# Patient Record
Sex: Female | Born: 1990
Health system: Southern US, Community
[De-identification: ages and names within clinical notes are randomized; demographics above are authoritative.]

## PROBLEM LIST (undated history)

## (undated) ENCOUNTER — Inpatient Hospital Stay (HOSPITAL_COMMUNITY): Payer: Self-pay

## (undated) DIAGNOSIS — E282 Polycystic ovarian syndrome: Secondary | ICD-10-CM

## (undated) DIAGNOSIS — R011 Cardiac murmur, unspecified: Secondary | ICD-10-CM

## (undated) DIAGNOSIS — Q249 Congenital malformation of heart, unspecified: Secondary | ICD-10-CM

## (undated) DIAGNOSIS — O24419 Gestational diabetes mellitus in pregnancy, unspecified control: Secondary | ICD-10-CM

## (undated) HISTORY — PX: CARDIAC SURGERY: SHX584

## (undated) HISTORY — DX: Polycystic ovarian syndrome: E28.2

## (undated) HISTORY — DX: Gestational diabetes mellitus in pregnancy, unspecified control: O24.419

## (undated) HISTORY — DX: Congenital malformation of heart, unspecified: Q24.9

## (undated) HISTORY — DX: Cardiac murmur, unspecified: R01.1

## (undated) HISTORY — PX: COARCTATION OF AORTA REPAIR: SHX261

---
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
## 2009-02-08 ENCOUNTER — Encounter (INDEPENDENT_AMBULATORY_CARE_PROVIDER_SITE_OTHER): Payer: Self-pay | Admitting: Internal Medicine

## 2009-02-08 ENCOUNTER — Ambulatory Visit: Payer: Self-pay | Admitting: Internal Medicine

## 2009-02-08 LAB — CONVERTED CEMR LAB
BUN: 11 mg/dL (ref 6–23)
Basophils Absolute: 0 10*3/uL (ref 0.0–0.1)
Basophils Relative: 0 % (ref 0–1)
Calcium: 10 mg/dL (ref 8.4–10.5)
Chloride: 105 meq/L (ref 96–112)
Creatinine, Ser: 0.58 mg/dL (ref 0.40–1.20)
Eosinophils Absolute: 0.2 10*3/uL (ref 0.0–0.7)
Eosinophils Relative: 3 % (ref 0–5)
FSH: 2.8 milliintl units/mL
HCT: 42 % (ref 36.0–46.0)
Lymphs Abs: 2.6 10*3/uL (ref 0.7–4.0)
MCHC: 32.1 g/dL (ref 30.0–36.0)
MCV: 86.4 fL (ref 78.0–100.0)
Neutrophils Relative %: 51 % (ref 43–77)
Platelets: 254 10*3/uL (ref 150–400)
Prolactin: 7.3 ng/mL
RDW: 13.3 % (ref 11.5–15.5)
WBC: 6.8 10*3/uL (ref 4.0–10.5)

## 2009-02-09 ENCOUNTER — Encounter (INDEPENDENT_AMBULATORY_CARE_PROVIDER_SITE_OTHER): Payer: Self-pay | Admitting: Internal Medicine

## 2009-02-13 ENCOUNTER — Ambulatory Visit (HOSPITAL_COMMUNITY): Admission: RE | Admit: 2009-02-13 | Discharge: 2009-02-13 | Payer: Self-pay | Admitting: Internal Medicine

## 2011-12-16 ENCOUNTER — Encounter (HOSPITAL_COMMUNITY): Payer: Self-pay | Admitting: Anesthesiology

## 2011-12-16 ENCOUNTER — Inpatient Hospital Stay: Admit: 2011-12-16 | Payer: Self-pay | Admitting: General Surgery

## 2011-12-16 SURGERY — APPENDECTOMY, LAPAROSCOPIC
Anesthesia: General | Laterality: Bilateral

## 2013-03-24 ENCOUNTER — Encounter: Payer: Self-pay | Admitting: Obstetrics and Gynecology

## 2013-03-24 ENCOUNTER — Ambulatory Visit (INDEPENDENT_AMBULATORY_CARE_PROVIDER_SITE_OTHER): Payer: Self-pay | Admitting: Obstetrics and Gynecology

## 2013-03-24 VITALS — BP 110/77 | HR 77 | Temp 98.0°F | Ht 62.5 in | Wt 208.6 lb

## 2013-03-24 DIAGNOSIS — E282 Polycystic ovarian syndrome: Secondary | ICD-10-CM

## 2013-03-24 DIAGNOSIS — Z01419 Encounter for gynecological examination (general) (routine) without abnormal findings: Secondary | ICD-10-CM

## 2013-03-24 DIAGNOSIS — N913 Primary oligomenorrhea: Secondary | ICD-10-CM

## 2013-03-24 DIAGNOSIS — N915 Oligomenorrhea, unspecified: Secondary | ICD-10-CM

## 2013-03-24 HISTORY — DX: Polycystic ovarian syndrome: E28.2

## 2013-03-24 NOTE — Progress Notes (Signed)
  Subjective:     Kimberly Reid is a 22 y.o. female G0P0 with LMP 01/26/2013 who is here for a comprehensive physical exam. The patient reports irregular menses. Patient reports onset of menarche at 28. She reports being irregular since that time, often skipping 3-4 months. Patient was seen 3 years ago in [place] and was told she has numerous cysts on her ovaries. Patient was also advised at that time to start Metformin and birth control pills. She states that it helped her become regular. She is now trying to conceive. Patient is otherwise without any other complaints and denies facial hair or hair in female distribution pattern.  History   Social History  . Marital Status: Single    Spouse Name: N/A    Number of Children: N/A  . Years of Education: N/A   Occupational History  . Not on file.   Social History Main Topics  . Smoking status: Never Smoker   . Smokeless tobacco: Never Used  . Alcohol Use: Yes  . Drug Use: No  . Sexually Active: Yes    Birth Control/ Protection: None   Other Topics Concern  . Not on file   Social History Narrative  . No narrative on file   Health Maintenance  Topic Date Due  . Pap Smear  03/07/2008  . Tetanus/tdap  03/07/2009  . Influenza Vaccine  06/28/2013   Past Medical History  Diagnosis Date  . 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 Cardiac surgery     History  Substance Use Topics  . Smoking status: Never Smoker   . Smokeless tobacco: Never Used  . Alcohol Use: Yes   Family History  Problem Relation Age of Onset  . Diabetes Mother        Review of Systems A comprehensive review of systems was negative.   Objective:      GENERAL: Well-developed, well-nourished female in no acute distress.  HEENT: Normocephalic, atraumatic. Sclerae anicteric.  NECK: Supple. Normal thyroid.  LUNGS: Clear to auscultation bilaterally.  HEART: Regular rate and  rhythm. BREASTS: Symmetric in size. No palpable masses or lymphadenopathy, skin changes, or nipple drainage. ABDOMEN: Soft, nontender, nondistended. Obese. PELVIC: Normal external female genitalia. Vagina is pink and rugated.  Normal discharge. Normal appearing cervix. Uterus is normal in size. No adnexal mass or tenderness. EXTREMITIES: No cyanosis, clubbing, or edema, 2+ distal pulses.    Assessment:    Healthy female exam.      Plan:    Pap smear performed Suspect patient has PCOS Will order labs and pelvic ultrasound Discussed impact of PCOS on fertility. Advised to continue Metformin but to increase dose to 1000 mg BID. Also discussed the use of over the counter ovulation kits. Patient will be contacted with any abnormal results. RTC prn See After Visit Summary for Counseling Recommendations

## 2013-03-24 NOTE — Patient Instructions (Signed)
Cuidados preventivos en las mujeres adultas  (Preventive Care for Adults, Female) Un estilo de vida saludable y los cuidados preventivos pueden favorecer la salud y [place]. Las guas para Engineer, building services salud para las mujeres incluyen las siguientes prcticas clave:   Un examen fsico de rutina anual es un buen modo de Chief Operating Officer su salud y Education officer, environmental estudios preventivos. Le da la posibilidad de Agricultural consultant preocupaciones y Solicitor el estado de su salud, y que le realicen estudios completos.  Consulte al dentista para realizar un examen de rutina y cuidados preventivos cada 6 meses. Cepllese los dientes al [employer] veces por da y psese el hilo dental al menos una vez por da. Una buena higiene bucal evita caries y enfermedades de las encas.  La frecuencia con que debe hacerse exmenes de la vista depende de la edad, el estado de [place], la historia familiar, el uso de lentes de contacto y otros factores. Siga las recomendaciones del mdico para saber con qu frecuencia debe hacerse exmenes de la vista.  Consuma una dieta saludable. Los Sun Microsystems, frutas, granos enteros, productos lcteos descremados y protenas magras contienen los nutrientes que usted necesita sin necesidad de consumir muchas caloras. Disminuya el consume de alimentos con alto contenido de grasas slidas, azcar y sal agregadas. Consuma la cantidad de caloras adecuada para usted.Si es necesario, pdale una dieta adecuada al profesional que lo asiste.  La actividad fsica regular es una de las cosas ms importantes que puede hacer por su salud. Los adultos deben hacer al menos 150 minutos de ejercicios de [place] de intensidad moderada (toda actividad que aumente la frecuencia cardaca y lo haga transpirar) cada semana. Adems, la [employer]son de los adultos necesita ejercicios de fortalecimiento muscular 2  ms [employer].  Hay que mantener un peso saludable. El ndice de masa corporal [facility]) es una  herramienta que identifica posibles problemas con [place]. Proporciona una estimacin de la grasa corporal basndose en el peso y la altura. El mdico podr determinar si [facility] y podr ayudarlo a Personnel officer o Pharmacologist un peso saludable.Para los adultos de 20 aos o ms:  Un [facility] menor a 18,5 se considera bajo peso.  Un [facility] entre 18,5 y 24,9 es normal.  Un [facility] entre 25 y 29,9 es sobrepeso.  Un IMC entre 30 o ms es obesidad.  Mantenga un nivel normal de lpidos y colesterol en sangre practicando actividad fsica y minimizando la ingesta de grasas saturadas. Consuma una dieta balanceada y saludable, e incluya variedad de frutas y vegetales. Los [employer] de lpidos y Oncologist en sangre deben Games developer a los 20 aos y repetirse cada 5 aos. Si los niveles de colesterol son altos, tiene ms de 50 aos o tiene riesgo elevado de sufrir enfermedades cardacas necesitar controlarse con ms frecuencia.Si tiene [employer] de lpidos y colesterol, debe recibir tratamiento con medicamentos, si la dieta y el ejercicio no son efectivos.  Si fuma, consulte con [place] [employer] de las opciones para dejar de [place]. Si no lo hace, no comience.  Si est embarazada no beba alcohol. Si est amamantando, beba alcohol con prudencia. Si elige beber alcohol, no se exceda de 1 medida por da. Se considera una medida a 12 onzas (355 ml) de cerveza, 5 onzas (148 ml) de vino, o 1,5 onzas (44 ml) de licor.  Evite el alcohol y el consumo de drogas. No comparta agujas. Pida ayuda si necesita asistencia o instrucciones con respecto a abandonar el consumo de alcohol, cigarrillos o  drogas.  La hipertensin arterial causa enfermedades cardacas y [place] el riesgo de ictus. Debe controlar su presin arterial al menos cada 1 o 2 aos. La presin arterial elevada que persiste debe tratarse con medicamentos si la prdida de peso y el ejercicio no son efectivos.  Si tiene entre 55 y 49 aos, consulte a su mdico si debe tomar  aspirina para prevenir enfermedades cardacas.  Los anlisis para la diabetes incluyen la toma de [place] de sangre para controlar el nivel de azcar en la sangre durante el [place]. Debe hacerlo cada 3 aos despus de los 45 aos si est dentro de su peso normal y sin factores de riesgo para la diabetes. Las pruebas deben comenzar a edades tempranas o llevarse a cabo con ms frecuencia si tiene sobrepeso y al menos 1 factor de riesgo para la diabetes.  Las evaluaciones para Market researcher de mama son un mtodo preventivo fundamental para las mujeres. Debe practicar la "autoconciencia de las mamas". Esto significa que debe comprender como es la apariencia normal y como se sienten sus mamas e incluir un autoexamen. Si detecta algn cambio, no importa cun pequeo sea, debe informarlo a su mdico. Las mujeres entre 20 y 30 aos deben hacer un examen clnico de las mamas como parte del examen regular de [place], cada 1 a 3 aos. Despus de los 40 aos deben Sprint [employer]el Corporation. Deben hacerse una mamografa cada ao, comenzando a los 40 aos. Las mujeres con Brewing technologist de cncer de mama deben hablar con el mdico para hacer un estudio gentico. Las que tienen ms riesgo deben hacerse una ecografa y [place] mamografa todos [place].  Un papanicolau se realiza para diagnosticar cncer de cuello de tero. Muestra los cambios celulares en el cuello que pueden transformarse en cncer si no se tratan. El papanicolau es un procedimiento por el que se obtienen clulas de la parte inferior del tero (cuello) y son examinadas.  Las mujeres deben [employer] un papanicolau a Glass blower/designer de los [address].  [place] 21 y los 29 aos debe repetirse [address].  Luego de los 30 aos, debe realizarse un Papanicolaou cada tres aos siempre que los 3 estudios anteriores sean normales.  Algunas mujeres sufren problemas mdicos que aumenta la probabilidad de Research officer, political party cervical. Consulte a su mdico acerca de estos  problemas. Es muy importante que le informe a su mdico si aparecen nuevos problemas poco despus de su ltimo Papanicolaou. En estos casos, el mdico podr indicar que se realice el Papanicolaou con ms frecuencia.  Estas recomendaciones son las mismas para todas las mujeres hayan recibido o no la vacuna para el VPH (virus del papiloma humano).  Si le han realizado una histerectoma por un problema que no era cncer u otra enfermedad que podra causar cncer, ya no necesitar un Papanicolaou. Sin embargo, si ya no necesita hacerse un Papanicolau, es una buena idea hacerse un examen regularmente para asegurarse de que no hay otros problemas.  Si tiene entre 65 y 46 aos y ha tenido un Papanicolaou normal en los ltimos 10 aos, ya no ser Music therapist. Sin embargo, si ya no necesita hacerse un Papanicolau, es una buena idea hacerse un examen regularmente para asegurarse de que no hay otros problemas.  Si ha recibido un tratamiento para Management consultant cervical o para una enfermedad que podra causar cncer, Musician un Papanicolaou y Paediatric nurse durante al menos 20 aos de concluir el [place].  Si no se ha  hecho el examen con regularidad, debern volver a evaluarse los factores de riesgo (como el tener un nuevo compaero sexual) para Occupational psychologist a Printmaker.  La prueba del VPH es un anlisis adicional que puede usarse para Engineer, site de cuello de tero. Esta prueba busca la presencia del virus que causa los cambios en el cuello. Las [employer] se recolectan durante el papanicolau pueden usarse para el VPH. La prueba para el VPH puede usarse para Development worker, community a mujeres de ms de 30 aos y debe usarse en mujeres de cualquier edad Cisco del papanicolau no sean claros. Despus de los 30 aos, las mujeres deben hacerse el anlisis para el VPH con la misma frecuencia que el papanicolau.  El cncer colorectal puede detectarse y con fecuencia puede  prevenirse. La mayor parte de los estudios de rutina comienzan a los 50 aos y [place] 75 aos. Sin embargo, el mdico podr aconsejarle que lo haga antes, si tiene factores de riesgo para el cncer de colon. Una vez por ao, el profesional le dar un kit de prueba para Recruitment consultant oculta en la materia fecal. Ladonna Snide un tubo con Ladon Applebaum cmara en su extremo para examinar directamente el colon (sigmoidoscopa o colonoscopa), para detectar formas temprana de cncer colorectal. Hable con su mdico si tiene 50 aos, cuando comience con los estudios de [place]. El examen directo del colon debe repetirse cada 5 a 10 aos, hasta los 75 aos, excepto que se encuentren formas tempranas de plipos precancerosos o pequeos bultos.  Se recomienda realizar un anlisis de sangre para Engineer, manufacturing hepatitis C a todas las personas [address] 1946 y 1966, y a todo aquel que tenga un riesgo conocido de haber contrado esta enfermedad.  Practique el sexo seguro. Use condones y evite las prcticas sexuales riesgosas para disminuir el contagio de enfermedades de transmisin sexual. Las enfermedades de transmisin sexual son la gonorrea, clamidia, sfilis, tricomonas, herpes, VPH y el virus de inmunodeficiencia humana (VIH). El herpes, el VIH y [place] VPH son enfermedades virales que no tienen [place]. Pueden producir discapacidad, cncer y [employer]. Las mujeres sexualmente activas de 25 aos o menos deben ser evaluadas para Engineer, manufacturing clamidia. Las Coca Cola que tengan mltiples compaeros tambin deben hacerse el anlisis para Engineer, manufacturing clamidia. Se recomienda realizar anlisis para detectar otras enfermedades de transmisin sexual si es sexualmente [place] y tiene riesgos.  La osteoporosis es una enfermedad en la que los huesos pierden los minerales y la fuerza por el avance de la edad. El resultado pueden ser fracturas graves en los [place]. El riesgo de osteoporosis puede identificarse con Neomia Dear prueba de  densidad sea. Las mujeres de ms de 65 aos y las que tengan riesgos de sufrir fracturas u osteoporosis deben pedir consejo a su mdico. Consulte a su mdico si debe tomar un suplemento de calcio o de vitamina D para reducir el riesgo de osteoporosis.  La menopausia se asocia a sntomas y riesgos fsicos. Se dispone de una terapia de reemplazo hormonal para disminuir los sntomas y [place]. Consulte a su mdico para saber si la terapia de reemplazo hormonal es conveniente para usted.  Use una pantalla solar con un factor SPF de 30 o mayor. Aplique pantalla de Pietro Cassis y repetida a lo largo del Futures trader. Pngase al resguardo del sol cuando la sombra sea ms pequea que usted. Protjase usando mangas y [place], un sombrero de ala ancha y gafas para el sol todo [employer],  siempre que se encuentre en el exterior.  Una vez por mes hgase un examen de la piel de todo el cuerpo usando un espejo para ver la espalda. nforme al mdico si aparecen nuevos lunares, los que ya estn tienen bordes [address], los que sean ms grandes que una goma de lpiz o los que hayan cambiado su forma o color.  Mantngase al da con las vacunas.  Gripe: Debe aplicarse una dosis todos en cada otoo (o invierno). La composicin de la vacuna de la gripe cambia todos los [place], por lo tanto no es suficiente con vacunarse una vez.  Vacuna antineumocccica de polisacridos Debe aplicarse 1  2 dosis si fuma o si sufre ciertas enfermedades crnicas. Necesitar 1 dosis a los 65 aos (o ms) si nunca se ha vacunado.  Vacuna difteria, ttanos, tos convulsa (DTP). Aplquese una dosis de la vacuna DTaP (vacuna contra la tos convulsa para adultos) si es menor de 65 aos, si tiene ms de 65 aos y est en contacto con un beb, es un trabajador de la [place], es una mujer embarazada o simplemente quiere estar protegido de la enfermedad. Luego necesitar un refuerzo de DT cada 10 aos. Consulte con su mdico si no ha recibido al menos  3 dosis de la vacuna contra el ttanos (y la difteria) en algn momento de su vida o tiene una herida profunda o sucia.  VPH: Debe aplicarse esta vacuna si tiene 26 aos o menos. La vacuna se administra en 3 dosis, generalmente durante el curso de 6 meses.  MMR (sarampin, paperas, rubola) Debe aplicarse al menos una dosis de MMR si ha nacido en 1957 o despus. Podra tambin necesitar una segunda dosis.  Antimeningocccica Si tiene entre 19 y 37 aos y es un estudiante universitario de Therapist, occupational que vive en una residencia estudiantil, o tiene alguna enfermedad mdica, debe recibir esta vacuna. Podra tambin necesitar dosis de refuerzo.  Herpes zoster (culebrilla). Si tiene 60 aos o ms debe aplicarse esta vacuna ahora.  Varicela Si nunca se vacun o slo recibi una dosis, hable con su mdico para averiguar si necesita aplicarse esta vacuna.  Hepatitis A. Debe aplicarse esta vacuna si tiene un factor de riesgo especfico para contraer una infeccin por el virus de la hepatitis A, o simplemente desea estar protegido contra la enfermedad. La vacuna se administra en 2 dosis, con una diferencia entre 6 y 18 meses.  Hepatitis B. Debe aplicarse esta vacuna si tiene un factor de riesgo especfico para contraer una infeccin por el virus de la hepatitis B, o simplemente desea estar protegido contra la enfermedad. La vacuna se administra en 3 dosis, generalmente durante el curso de 6 meses. Controles preventivos - Frecuencia Edad 19 a 39  Control de la presin arterial.** / Cada 1 a 2 aos.  Control de lpidos y colesterol.** / Cada 5 aos, comenzando a los 20 aos.  Examen clnico de mamas.** / Cada 3 aos en las [employer] 20 y los [address] [place].  Papanicolau.** / Cada 2 aos [employer] 21 y los [place]. Despus de los [address] [place], y [place] 65 o 70, con una historia de 3 papanicolau normales consecutivos.  Pruebas para el VPH.** / Cada 3 aos, a partir de los [address] [place], y [place] 65 o 70, con  una historia de 3 papanicolau normales consecutivos.  Anlisis de sangre para la hepatitis C. ** / Para todo individuo con riesgos conocidos para la hepatitis C.  Autoexamen de piel. /  [employer].  Vacuna contra la gripe.** / [employer].  Vacuna antineumocccica de polisacridos.** / Debe aplicarse 1  2 dosis si fuma o si sufre ciertas enfermedades crnicas.  Vacuna difteria, ttanos, tos convulsa (Tdap, Td). / Una dosis nica de vacuna Tdap. Luego necesitar un refuerzo de DT cada 10 aos.  Vacuna Printmaker. / 3 dosis en el curso de 6 meses, si tiene 26 aos o menos.  MMR (sarampin, paperas, rubola). / Debe aplicarse al menos una dosis de MMR si ha nacido en 1957 o despus. Podra tambin necesitar una segunda dosis.  Vacunacin antimeningocccica. / Si tiene entre 35 y 94 aos y es un estudiante universitario de Therapist, occupational que vive en una residencia estudiantil, o tiene alguna enfermedad mdica, debe recibir esta vacuna. Podra tambin necesitar dosis de refuerzo.  Vacuna contra la varicela.** / Consltelo con el mdico.  Vacuna contra la hepatitis A.** / Consltelo con el mdico. 2 dosis, con un intervalo entre 6 a 18 meses.  Vacuna contra la hepatitis B.** / Consltelo con el mdico. 3 dosis en el curso de 6 meses. **La historia familiar y personal de riesgos y enfermedades puede cambiar las recomendaciones del mdico. Document Released: 07/24/2005 Document Revised: 01/06/2012 ExitCare Patient Information 2014 [place], [place].  Sndrome poliqustico de los ovarios (Polycystic Ovarian Syndrome) El sndrome poliqustico de los ovarios es una enfermedad con una gran cantidad de problemas. Uno de ellos es con los ovarios. Los ovarios son rganos ubicados en la pelvis de la mujer, a cada lado del tero. Normalmente, durante el ciclo menstrual, se libera un vulo de un ovario cada mes. Esto se denomina ovulacin. Cuando un vulo es fertilizado, se dirige al tero, donde  comienza a desarrollarse el beb. El vulo debe ir a travs de la trompa de Falopio hacia la matriz (tero). Los ovarios tambin producen las hormonas estrgeno y Education officer, museum. Estas hormonas Primary school teacher de los pechos de la mujer, la forma de su cuerpo y Herbalist. Tambin regulan el ciclo menstrual y Firefighter. A veces, se forman quistes en los ovarios. Un quiste es una cavidad llena de lquido. En el ovario, pueden formarse diferentes tipos de quistes. El tipo ms frecuente de quiste de ovario se denomina quiste de la ovulacin o funcional. Es normal y generalmente se forma durante el ciclo menstrual habitual. A menudo se forma en un ciclo menstrual normal. Cada mes, en los ovarios de la mujer crecen quistes pequeos que contienen los vulos. Cuando el vulo crece por completo, se abre el saco que lo contiene. Este libera al vulo. El saco luego se disuelve. En un tipo de quiste funcional, denominado quiste folicular, el saco no se rompe para liberar el vulo. Podra continuar creciendo. Esto tipos de quistes generalmente desaparecen entre 1 y 3 meses.  Un tipo de problema de quistes en los ovarios se denomina Sndrome Poliqustico de los Ovarios (SPQO). En esta enfermedad, se forman mucho quistes en el folculo, pero no se rompe ni produce un vulo. Este problema de salud afecta a lo siguiente:  El ciclo menstrual  El corazn.  Obesidad.  Cncer en el tero.  La fertilidad.  Vasos sanguneos.  Crecimiento del vello (en el rostro y el cuerpo) o calvicie.  Hormonas.  Apariencia.  Hipertensin arterial.  Ictus.  Produccin de insulina.  Inflamacin del hgado.  Colesterol y triglicridos elevados en sangre. CAUSAS  No se conoce la causa exacta de esta enfermedad.  Las mujeres con [place]  a menudo tienen la madre o una hermana con [place]. Esto no es prueba suficiente para decir que es hereditario.  Muchas mujeres con SPQO tienen problemas de [place].  Los  investigadores estn estudiando la relacin entre el SPQO y la capacidad del cuerpo para producir insulina. La insulina es una hormona que regula los cambios de International aid/development worker, almidn y otro alimentos a energa para su utilizacin o almacenamiento en el cuerpo. Algunas mujeres con SPQO producen demasiada insulina. Es posible que los ovarios reaccionen al producir muchas hormonas masculinas, denominadas andrgenos. Esto puede llevar al acn, crecimiento excesivo del vello, aumento de peso y problemas de ovulacin.  Demasiada produccin de la hormona luteinizante (LH) de la glndula pituitaria en el cerebro estimula al ovario para producir demasiado hormona masculina (andrgenos). SNTOMAS  Poco frecuente o ningn perodo menstrual o hemorragias irregulares.  Incapacidad para quedar embarazada (infertilidad) debido a la falta de ovulacin.  Aumento del crecimiento del vello en el rostro, el trax, el [address], la espalda, los muslos o los dedos de los pies.  Acn, piel grasa o caspa.  Dolor en la pelvis.  Ganancia de peso u obesidad, por lo general se acumula grasa alrededor de Lobbyist.  Diabetes tipo 2 (generalmente no requiere insulina).  Colesterol elevado  Presin arterial elevada.  Patrn masculino de calvicie o cabello fino.  Manchas marrn oscuro o negras en la piel del cuello, brazos, pechos o muslos.  Marcas, o pequeos excesos de piel en la zona del cuello o las [place].  Apnea del sueo Esto es ronquidos excesivos e interrupciones de la respiracin cuando se est dormido.  La voz se hace ms grave.  Diabetes gestacional durante el embarazo.  Aumenta el riesgo de aborto espontneo. DIAGNSTICO No hay ninguna prueba que pueda diagnosticar esta enfermedad.   El profesional:  Observar el historial clnico.  Education officer, environmental un examen plvico  Indicar una ecografa  Controlar sus niveles de hormonas femeninas y [place].  Medir los niveles de azcar o glucosa en la  sangre.  Realizar otros anlisis de [place].  Si est produciendo muchas hormonas masculinas, el mdico se asegurar que se deba al SPQO. En el examen fsico, el mdico querr [employer] zonas de aumento de vello. Trate de Social worker natural del vello algunos das antes de la visita.  Durante el examen plvico, los ovarios pueden estar agrandados o inflamados debido al gran nmero de pequeos quistes. Esto podr verse ms fcilmente mediante un ultrasonido o examen vaginal para ver si hay quistes en los ovarios y las paredes del tero (endometrio). La pared uterina puede volverse ms gruesa si no ha habido un perodo regular. TRATAMIENTO Debido a que no hay cura para el SPQO, es necesario controlarlo para Physiological scientist. Los tratamientos se basan en los sntomas. Tambin se basa en su deseo de tener un beb o usar anticonceptivos.  Los tratamientos pueden ser:  Hormona progesterona, para iniciar un periodo menstrual.  Pldoras anticonceptivas, para Orthoptist.  Medicamentos para estimular la ovulacin, si quiere quedar embarazada.  Medicamentos para Air traffic controller.  Medicamentos para Scientist, physiological presin arterial.  Medicamentos y dietas para Chief Operating Officer los altos niveles de colesterol y triglicridos en la sangre.  La ciruga, realizando pequeos cortes en el ovario, para disminuir la produccin de hormona masculina. Esto se realiza a travs de un tubo largo luminoso (laparoscopio) y se coloc en la pelvis a travs de una pequea incisin en la zona inferior del abdomen. El profesional que la Hotel manager  con usted las opciones a Public relations account executive. LAS MUJERES CON SPQO TIENEN ESTAS CARACTERSTICAS:  Altos niveles de hormonas masculinas denominada andrgenos.  Perodos menstruales irregulares o ausentes.  Pueden tener pequeos [employer]O. El SPQO es el problema hormonal reproductivo ms comn en las mujeres de edad frtil. PORQUE LAS  MUJERES CON SNDROME POLI QUSTICO TIENEN PROBLEMAS CON SU CICLO MENSTRUAL Todos los meses, comienzan a Customer service manager en los ovarios alrededor de 20 vulos. Cuando un vulo crece y [place], el folculo se abre para liberarlo para que pueda viajar a travs de las trompas de Falopio para Tree surgeon. Cuando un vulo sale del folculo, comienza la ovulacin. En las mujeres con PQO, el ovario no produce todas las hormonas necesarias para que cualquiera de los vulos [place].Pueden comenzar a desarrollarse y Counselling psychologist lquido, pero ninguno se desarrolla lo suficiente. En vez de eso, algunos permanecen como quistes. Como no madura ni se libera ningn vulo, no ocurre la ovulacin y se produce la hormona progesterona. Sin progesterona, el ciclo menstrual es irregular o ausente. Adems, el quiste produce hormonas masculinas, que continan evitando la ovulacin.  Document Released: 01/30/2009 Document Revised: 01/06/2012 [facility] Patient Information 2014 [place], [place].

## 2013-03-25 LAB — TSH: TSH: 3.697 u[IU]/mL (ref 0.350–4.500)

## 2013-04-01 ENCOUNTER — Ambulatory Visit (HOSPITAL_COMMUNITY)
Admission: RE | Admit: 2013-04-01 | Discharge: 2013-04-01 | Disposition: A | Discharge status: Home / Self Care | Payer: No Typology Code available for payment source | Source: Ambulatory Visit | Attending: Obstetrics and Gynecology | Admitting: Obstetrics and Gynecology

## 2013-04-01 DIAGNOSIS — N926 Irregular menstruation, unspecified: Secondary | ICD-10-CM | POA: Insufficient documentation

## 2013-04-01 DIAGNOSIS — N913 Primary oligomenorrhea: Secondary | ICD-10-CM

## 2013-04-01 DIAGNOSIS — N915 Oligomenorrhea, unspecified: Secondary | ICD-10-CM | POA: Insufficient documentation

## 2013-04-26 ENCOUNTER — Ambulatory Visit: Payer: No Typology Code available for payment source

## 2013-05-26 ENCOUNTER — Ambulatory Visit: Payer: No Typology Code available for payment source | Attending: Family Medicine | Admitting: Family Medicine

## 2013-05-26 VITALS — BP 125/84 | HR 61 | Temp 99.1°F | Resp 16 | Ht 63.0 in | Wt 214.0 lb

## 2013-05-26 DIAGNOSIS — Z8774 Personal history of (corrected) congenital malformations of heart and circulatory system: Secondary | ICD-10-CM

## 2013-05-26 DIAGNOSIS — E8881 Metabolic syndrome: Secondary | ICD-10-CM

## 2013-05-26 DIAGNOSIS — Z9889 Other specified postprocedural states: Secondary | ICD-10-CM

## 2013-05-26 DIAGNOSIS — N912 Amenorrhea, unspecified: Secondary | ICD-10-CM

## 2013-05-26 DIAGNOSIS — E282 Polycystic ovarian syndrome: Secondary | ICD-10-CM | POA: Insufficient documentation

## 2013-05-26 LAB — POCT GLYCOSYLATED HEMOGLOBIN (HGB A1C): Hemoglobin A1C: 5.1

## 2013-05-26 LAB — POCT URINE PREGNANCY: Preg Test, Ur: NEGATIVE

## 2013-05-26 LAB — GLUCOSE, POCT (MANUAL RESULT ENTRY): POC Glucose: 88 mg/dl (ref 70–99)

## 2013-05-26 MED ORDER — METFORMIN HCL ER 500 MG PO TB24: 1000.0000 mg | ORAL_TABLET | Freq: Two times a day (BID) | ORAL | Status: DC

## 2013-05-26 NOTE — Patient Instructions (Addendum)
Infertilidad (Infertility) QU ES LA INFERTILIDAD?  La infertilidad normalmente se define como la incapacidad para quedar embarazada luego de un ao de relaciones sexuales regulares sin la utilizacin de mtodos anticonceptivos. O la incapacidad de llevar a trmino Nutritional therapist y Best boy el beb. La tasa de infertilidad en los [place] es de alrededor del 10%. El embarazo es el resultado de una cadena de sucesos. La mujer debe liberar el vulo de uno de sus ovarios (ovulacin). El vulo debe fertilizarse con el esperma. Luego viaja a travs de las trompas de Falopio hacia el tero (matriz), donde se une a la pared del tero y crece. El hombre debe tener suficiente esperma y el esperma debe unirse con el vulo (fertilizar) en el momento justo. El vulo fertilizado debe luego unirse al interior del tero. Esto parece simple, pero pueden ocurrir [employer] cosas que evitan que el embarazo se produzca.  DE QUIN ES EL PROBLEMA?  Un 20% de los casos de infertilidad se deben a problemas del hombres (factores masculinos) y un 65% se debe a problemas de la mujer (factores femeninos). Otras causas pueden ser Ardelia Mems combinacin de factores masculinos y femeninos o a causas desconocidas.  CULES SON LAS CAUSAS DE [place]?  La infertilidad en el hombre a menudo se debe a problemas para producir el esperma o hacer que el mismo llegue al vulo. Los problemas de esperma pueden existir desde el nacimiento o desarrollarse ms tarde debido a enfermedades o lesiones. Algunos hombres no producen esperma, o producen muy poco (oligospermia). Entre otros problemas se incluyen:  Disfuncin sexual  Problemas hormonales o endocrinos.  La edad. La fertilidad del hombre disminuye con la edad, pero no tan temprano como la femenina.  Infecciones.  Problemas congnitos Defectos de nacimiento, como ausencia de los tubos que transportan el esperma (conductos deferentes).  Problemas genticos o de  cromosomas.  Problemas de anticuerpos antiesperma.  Eyaculacin retrgrada (el esperma va hacia la vejiga).  Varicoceles, espermatoceles o tumores en los testculos.  El estilo de vida puede influir en el nmero y la calidad del esperma del hombre.  El alcohol y las drogas pueden reducir temporalmente la calidad del esperma.  Las toxinas ambientales, como los pesticidas y el plomo, pueden ocasionar algunos casos de infertilidad en hombres. CULES SON LAS CAUSAS DE LA INFERTILIDAD EN LA MUJER?   La infertilidad en las mujeres est causada mayormente por problemas con la ovulacin. Sin ovulacin, el vulo no puede fertilizarse.  Seales de problemas de ovulacin son perodos menstruales irregulares o ningn perodo.  Factores simples del estilo de vida, como el estrs, la dieta, o el entrenamiento deportivo, pueden afectar el balance hormonal de Arts administrator.  La edad. La fertilidad comienza a Chiropractor alrededor de los 30 aos y [place] a Proofreader de los 21.  Mucho menos a menudo, un desequilibrio hormonal por un problema mdico serio como un tumor en la glndula pituitaria, tiroides u otra enfermedad mdica crnica pueden ocasionar problemas de ovulacin.  Infecciones plvicas.  Sndrome de ovarios poliqusticos (aumento de hormonas masculinas, incapacidad de ovular).  Consumo de alcohol o drogas.  Toxinas ambientales, radiacin, pesticidas y ciertos qumicos.  La edad es un factor importante en la infertilidad femenina.  La capacidad de los ovarios de una mujer para producir vulos disminuye con la edad, especialmente despus de los 18 aos. Alrededor de un tercio de las parejas en las que la mujer tiene ms de 35 aos tendr problemas de infertilidad.  En el momento en el que alcanza la [place], cuando su perodo menstrual se detiene, la mujer ya no puede producir vulos ni quedar embarazada.  Otros problemas tambin pueden llevar a la infertilidad en las  mujeres. Si las trompas de [employer] estn bloqueadas en [employer] extremos, el vulo no puede pasar a travs de los tubos [place]. Tejido cicatrizal (adhesiones) en la pelvis que pueden obstruir las trompas. Esto puede dar como resultado una enfermedad inflamatoria plvica, endometriosis o una ciruga por embarazo ectpico (en la que el vulo fertilizado se ha implantado fuera del tero) o cualquier ciruga plvica o abdominal que ocasione adherencias.  Tumores fibroides o plipos en el tero.  Anormalidades congnitas (de nacimiento) del tero.  Infecciones en el cuello del tero (cervicitis).  Estenosis cervical (estrechamiento).  Mucosidad cervical anormal.  Sndrome poliqustico de los ovarios.  Tener relaciones sexuales muy seguidas ([place] 4 a 5 veces por semana).  Obesidad.  Anorexia.  Dficit nutricional.  Mucho ejercicio, con prdida de grasa corporal.  DES. Su madre ha recibido la hormona dietilstilbesterol cuando estaba embarazada de usted. CMO SE ANALIZA LA INFERTILIDAD?  Si ha estado tratando de quedar embarazada sin xito, podra querer buscar ayuda mdica. No debera esperar un ao de intentos sin xito antes de buscar ayuda profesional si:  Tiene mas de 37 aos de edad  Tiene razones para creer que podra tener problemas de fertilidad. Un examen mdico determinar las causas de infertilidad de la pareja, Normalmente el proceso comienza con:  Exmenes fsicos  Historias clnicas de ambos miembros de la pareja.  Historiales sexuales de ambos miembros de la pareja. Si no hay un problema evidente, como relaciones sexuales en tiempos inadecuados o ausencia de ovulacin, se necesitarn Training and development officer.   Para el hombre, los anlisis normalmente comienzan con pruebas de semen para observar:  La cantidad de esperma.  La forma del esperma.  El movimiento del esperma.  Realizar un historial clnico y quirrgico completo.  Examen  fsico.  Control de infecciones en los rganos reproductivos. Podrn realizarle anlisis hormonales.   Para la mujer, el primer paso del anlisis es saber si ovula cada mes. Hay diferentes modos de Secondary school teacher. Por ejemplo, puede llevar un registro de los cambios en la temperatura corporal por la maana y la textura de la mucosidad cervical. Otra herramienta es un kit de prueba de ovulacin casero, que puede comprarse en la farmacia.  Tambin pueden realizarse controles de ovulacin en el consultorio mdico, mediante anlisis de sangre para observar los niveles de hormonas o pruebas de [employer] ovarios. Si la mujer est ovulando, se necesitarn realizar ms exmenes. Algunas femeninas comunes incluyen:  Histerosalpingografa: Se realizan rayos x de las trompas de Falopio y el tero con una inyeccin de [place]. Mostrar si las trompas estn abiertas y la forma del tero.  Laparoscopa: Un anlisis de las trompas y otros rganos femeninos para Risk manager. Se utiliza un tubo con iluminacin llamado laparoscopio para observar el interior del abdomen.  Biopsia endometrial: Se toma una muestra del tejido del tero Engineer, manufacturing systems del perodo menstrual, para ver si el tejido le indica si est ovulando.  Prueba de ultrasonido transvaginal: Examina los rganos femeninos.  Histeroscopa: Utiliza un tubo con iluminacin para examinar el cuello del tero y el tero y ver si hay anormalidades dentro del mismo. TRATAMIENTO Dependiendo de los [employer], se sugerirn Engineer, technical sales. El tratamiento depende de la causa. [employer]  47 y el 90% de los casos de infertilidad se tratan con drogas o [place].   Hay varias drogas para la fertilidad que pueden utilizarse para las mujeres con problemas de ovulacin. Es importante hablar con el profesional que la asiste sobre la droga a Risk manager. Deber comprender los beneficios y [place] drogas. Segn el  tipo de droga para la fertilidad y la dosis [place], algunas mujeres podran tener embarazos mltiples (mellizos).  De ser [employer], se puede realizar una ciruga para reparar daos en los ovarios de la [place], trompas de [place], el cuello del tero o el tero.  Tratamiento quirrgico o mdico para la endometriosis o el sndrome de ovario poliqustico. A veces, los problemas de fertilidad en el hombre pueden corregirse con medicamentos o [place].  Inseminacin intrauterina, IUI, de esperma en concordancia con la ovulacin.  Cambios en el estilo de vida, si esta es la causa (prdida de [place], aumento de ejercicios, dejar de fumar, beber excesivamente o tomar drogas ilegales).  Otros tipos de ciruga:  Extirpar tumores por dentro o sobre el tero.  Eliminar tejido cicatrizal de dentro del tero.  Arreglar trompas obstruidas.  Eliminar tejido cicatrizal en la pelvis y alrededor de los rganos femeninos. QU ES LA TECNOLOGA DE REPRODUCCIN ASISTIDA (ART)?  La tecnologa de reproduccin asistida (ART) utiliza mtodos especiales para ayudar a parejas infrtiles. En esta tcnica se manipula tanto el vulo de la mujer como el esperma del hombre. El xito depende de muchos factores. La ART puede ser cara y demandar mucho tiempo. Pero ha hecho posible que muchas parejas tengan hijos que de otra manera no hubieran podido. A continuacin se enumeran algunos mtodos:  Fertilizacin. In Vitro (FIV). In Vitro (IVF) es un procedimiento que se hizo famoso con el nacimiento en [place], el primer "beb de probeta" del mundo. Se utiliza cuando las trompas de [employer] de la mujer estn bloqueadas o cuando el hombre tiene poco esperma. Se utiliza una droga para estimular los ovarios y producir mltiples vulos. Una vez maduros, los vulos se retiran y se Copywriter, advertising en una placa de cultivo con el esperma del hombre para la fertilizacin. Luego de aproximadamente 40 horas, los vulos se examinan para ver  si han sido fertilizados por el esperma y se han dividido en clulas. Esos vulos fertilizados (embriones) se colocan luego en el tero de la mujer. Esto evita el paso por las trompas de [place].  La transferencia intrafalopiana de gametas (GIFT) es similar al IVF, pero se utiliza cuando la mujer tiene al menos una trompa de falopio normal. Se colocan de tres a cinco vulos en la trompa de Falopio, junto con el esperma del hombre, para que la fertilizacin se realice dentro del cuerpo de Retail banker.  La transferencia intrafalopiana de cigotos (ZIFT), tambin se denomina transferencia embrionaria, y [place] el IVF con el GIFT. Los vulos retirados de los ovarios de la mujer se Health visitor laboratorio y se Copywriter, advertising en las trompas de [employer] en lugar de en el tero.  Los procedimientos de ART a menudo suponen la utilizacin de donantes de vulos (de [place] mujer) o de embriones congelados previamente. Los donantes de vulos pueden utilizarse si la mujer tiene [employer] ovarios o posee una enfermedad gentica que podra transmitirla al beb.  Cuando se realiza una ART hay mayor riesgo de embarazos mltiples, mellizos, trillizos o ms.  La inyeccin de esperma intracitoplasmtica es un procedimiento que inyecta un espermatozoide en el vulo para fertilizarlo.  El trasplante embrionario es un procedimiento que comienza con un embrin que se ha desarrollado en un medio especial (solucin qumica) preparado para mantener el embrin vivo por 2 a 5 das, y Conservation officer, historic buildings. En los [employer] que no puede encontrarse la causa y Firefighter no se produce, podr considerarse la adopcin. Document Released: 11/03/2007 Document Revised: 01/06/2012 [facility] Patient Information 2014 [place], [place]. Sndrome poliqustico de los ovarios (Polycystic Ovarian Syndrome) El sndrome poliqustico de los ovarios es una enfermedad con una gran cantidad de problemas. Uno de ellos es con los ovarios.  Los ovarios son rganos ubicados en la pelvis de la mujer, a cada lado del tero. Normalmente, durante el ciclo menstrual, se libera un vulo de un ovario cada mes. Esto se denomina ovulacin. Cuando un vulo es fertilizado, se dirige al tero, donde comienza a desarrollarse el beb. El vulo debe ir a travs de la trompa de Falopio hacia la matriz (tero). Los ovarios tambin producen las hormonas estrgeno y Education officer, museum. Estas hormonas Primary school teacher de los pechos de la mujer, la forma de su cuerpo y Herbalist. Tambin regulan el ciclo menstrual y Firefighter. A veces, se forman quistes en los ovarios. Un quiste es una cavidad llena de lquido. En el ovario, pueden formarse diferentes tipos de quistes. El tipo ms frecuente de quiste de ovario se denomina quiste de la ovulacin o funcional. Es normal y generalmente se forma durante el ciclo menstrual habitual. A menudo se forma en un ciclo menstrual normal. Cada mes, en los ovarios de la mujer crecen quistes pequeos que contienen los vulos. Cuando el vulo crece por completo, se abre el saco que lo contiene. Este libera al vulo. El saco luego se disuelve. En un tipo de quiste funcional, denominado quiste folicular, el saco no se rompe para liberar el vulo. Podra continuar creciendo. Esto tipos de quistes generalmente desaparecen entre 1 y 3 meses.  Un tipo de problema de quistes en los ovarios se denomina Sndrome Poliqustico de los Ovarios (SPQO). En esta enfermedad, se forman mucho quistes en el folculo, pero no se rompe ni produce un vulo. Este problema de salud afecta a lo siguiente:  El ciclo menstrual  El corazn.  Obesidad.  Cncer en el tero.  La fertilidad.  Vasos sanguneos.  Crecimiento del vello (en el rostro y el cuerpo) o calvicie.  Hormonas.  Apariencia.  Hipertensin arterial.  Ictus.  Produccin de insulina.  Inflamacin del hgado.  Colesterol y triglicridos elevados en  sangre. CAUSAS  No se conoce la causa exacta de esta enfermedad.  Las mujeres con SPQO a menudo tienen la madre o una hermana con [place]. Esto no es prueba suficiente para decir que es hereditario.  Muchas mujeres con SPQO tienen problemas de [place].  Los investigadores estn estudiando la relacin entre el SPQO y la capacidad del cuerpo para producir insulina. La insulina es una hormona que regula los cambios de International aid/development worker, almidn y otro alimentos a energa para su utilizacin o almacenamiento en el cuerpo. Algunas mujeres con SPQO producen demasiada insulina. Es posible que los ovarios reaccionen al producir muchas hormonas masculinas, denominadas andrgenos. Esto puede llevar al acn, crecimiento excesivo del vello, aumento de peso y problemas de ovulacin.  Demasiada produccin de la hormona luteinizante (LH) de la glndula pituitaria en el cerebro estimula al ovario para producir demasiado hormona masculina (andrgenos). SNTOMAS  Poco frecuente o ningn perodo menstrual o hemorragias irregulares.  Incapacidad para quedar embarazada (infertilidad)  debido a la falta de ovulacin.  Aumento del crecimiento del vello en el rostro, el trax, el [address], la espalda, los muslos o los dedos de los pies.  Acn, piel grasa o caspa.  Dolor en la pelvis.  Ganancia de peso u obesidad, por lo general se acumula grasa alrededor de Lobbyist.  Diabetes tipo 2 (generalmente no requiere insulina).  Colesterol elevado  Presin arterial elevada.  Patrn masculino de calvicie o cabello fino.  Manchas marrn oscuro o negras en la piel del cuello, brazos, pechos o muslos.  Marcas, o pequeos excesos de piel en la zona del cuello o las [place].  Apnea del sueo Esto es ronquidos excesivos e interrupciones de la respiracin cuando se est dormido.  La voz se hace ms grave.  Diabetes gestacional durante el embarazo.  Aumenta el riesgo de aborto espontneo. DIAGNSTICO No hay ninguna prueba que  pueda diagnosticar esta enfermedad.   El profesional:  Observar el historial clnico.  Education officer, environmental un examen plvico  Indicar una ecografa  Controlar sus niveles de hormonas femeninas y [place].  Medir los niveles de azcar o glucosa en la sangre.  Realizar otros anlisis de [place].  Si est produciendo muchas hormonas masculinas, el mdico se asegurar que se deba al SPQO. En el examen fsico, el mdico querr [employer] zonas de aumento de vello. Trate de Social worker natural del vello algunos das antes de la visita.  Durante el examen plvico, los ovarios pueden estar agrandados o inflamados debido al gran nmero de pequeos quistes. Esto podr verse ms fcilmente mediante un ultrasonido o examen vaginal para ver si hay quistes en los ovarios y las paredes del tero (endometrio). La pared uterina puede volverse ms gruesa si no ha habido un perodo regular. TRATAMIENTO Debido a que no hay cura para el SPQO, es necesario controlarlo para Physiological scientist. Los tratamientos se basan en los sntomas. Tambin se basa en su deseo de tener un beb o usar anticonceptivos.  Los tratamientos pueden ser:  Hormona progesterona, para iniciar un periodo menstrual.  Pldoras anticonceptivas, para Orthoptist.  Medicamentos para estimular la ovulacin, si quiere quedar embarazada.  Medicamentos para Air traffic controller.  Medicamentos para Scientist, physiological presin arterial.  Medicamentos y dietas para Chief Operating Officer los altos niveles de colesterol y triglicridos en la sangre.  La ciruga, realizando pequeos cortes en el ovario, para disminuir la produccin de hormona masculina. Esto se realiza a travs de un tubo largo luminoso (laparoscopio) y se coloc en la pelvis a travs de una pequea incisin en la zona inferior del abdomen. El profesional que la asiste comentar con usted las opciones a Public relations account executive. LAS MUJERES CON SPQO TIENEN ESTAS  CARACTERSTICAS:  Altos niveles de hormonas masculinas denominada andrgenos.  Perodos menstruales irregulares o ausentes.  Pueden tener pequeos [employer]. El SPQO es el problema hormonal reproductivo ms comn en las mujeres de edad frtil. PORQUE LAS MUJERES CON SNDROME POLI QUSTICO TIENEN PROBLEMAS CON SU CICLO MENSTRUAL Todos los meses, comienzan a Customer service manager en los ovarios alrededor de 20 vulos. Cuando un vulo crece y [place], el folculo se abre para liberarlo para que pueda viajar a travs de las trompas de Falopio para Tree surgeon. Cuando un vulo sale del folculo, comienza la ovulacin. En las mujeres con PQO, el ovario no produce todas las hormonas necesarias para que cualquiera de los vulos [place].Pueden comenzar a desarrollarse y Counselling psychologist lquido, pero ninguno se desarrolla lo suficiente. En vez de eso, algunos permanecen  como quistes. Como no madura ni se libera ningn vulo, no ocurre la ovulacin y se produce la hormona progesterona. Sin progesterona, el ciclo menstrual es irregular o ausente. Adems, el quiste produce hormonas masculinas, que continan evitando la ovulacin.  Document Released: 01/30/2009 Document Revised: 01/06/2012 [facility] Patient Information 2014 [place], [place]. Secondary Amenorrhea  Secondary amenorrhea is the stopping of menstrual flow for 3 to 6 months in a female who has previously had periods. There are many possible causes. Most of these causes are not serious. Usually treating the underlying problem causing the loss of menses will return your periods to normal. CAUSES  Some common and uncommon causes of not menstruating include:  Malnutrition.  Low blood sugar (hypoglycemia).  Polycystic ovarian disease.  Stress or fear.  Breastfeeding.  Hormone imbalance.  Ovarian failure.  Medications.  Extreme obesity.  Cystic fibrosis.  Low body weight or drastic weight reduction from any cause.  Early menopause.  Removal  of ovaries or uterus.  Contraceptives.  Illness.  Long term (chronic) illnesses.  Cushing's syndrome.  Thyroid problems.  Birth control pills, patches, or vaginal rings for birth control. DIAGNOSIS  This diagnosis is made by your caregiver taking a medical history and doing a physical exam. Pregnancy must be ruled out. Often times, numerous blood tests of different hormones in the body may be measured. Urine testing may be done. Specialized x-rays may have to be done as well as measuring the body mass index (BMI). TREATMENT  Treatment depends on the cause of the amenorrhea. If an eating disorder is present, this can be treated with an adequate diet and therapy. Chronic illnesses may improve with treatment of the illness. Overall, the outlook is good. The amenorrhea may be corrected with medications, lifestyle changes, or surgery. If the amenorrhea cannot be corrected, it is sometimes possible to create a false menstruation with medications. Document Released: 11/25/2006 Document Revised: 01/06/2012 Document Reviewed: 10/02/2007 [facility] Patient Information 2014 [place], [place].

## 2013-05-26 NOTE — Progress Notes (Signed)
Patient ID: Kimberly Reid, female   DOB: 05-22-1991, 22 y.o.   MRN: [id number]  CC: follow up  Interpreter used to communicate directly with patient for entire encounter including providing detailed patient instructions  HPI: Pt has been trying to get pregnant for 1 year.  She was seen at health dept and started on metformin but says she felt nausea and light headed from it and couldn't tolerate it. She has been having irregular periods for many years but reports she has not had a period in about 5 months.  She would like to be evaluated by OB for infertility.    No Known Allergies Past Medical History  Diagnosis Date  . 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 Heart murmur    No current outpatient prescriptions on file prior to visit.   No current facility-administered medications on file prior to visit.   Family History  Problem Relation Age of Onset  . Diabetes Mother    History   Social History  . Marital Status: Married    Spouse Name: N/A    Number of Children: N/A  . Years of Education: N/A   Occupational History  . Not on file.   Social History Main Topics  . Smoking status: Passive Smoke Exposure - Never Smoker  . Smokeless tobacco: Never Used  . Alcohol Use: Yes  . Drug Use: No  . Sexually Active: Yes    Birth Control/ Protection: None   Other Topics Concern  . Not on file   Social History Narrative  . No narrative on file    Review of Systems  Constitutional: Negative for fever, chills, diaphoresis, activity change, appetite change and fatigue.  HENT: Negative for ear pain, nosebleeds, congestion, facial swelling, rhinorrhea, neck pain, neck stiffness and ear discharge.   Eyes: Negative for pain, discharge, redness, itching and visual disturbance.  Respiratory: Negative for cough, choking, chest tightness, shortness of breath, wheezing and stridor.   Cardiovascular: Negative for chest pain, palpitations and leg swelling.   Gastrointestinal: Negative for abdominal distention.  Genitourinary: Negative for dysuria, urgency, frequency, hematuria, flank pain, decreased urine volume, difficulty urinating and dyspareunia.  Musculoskeletal: Negative for back pain, joint swelling, arthralgias and gait problem.  Neurological: Negative for dizziness, tremors, seizures, syncope, facial asymmetry, speech difficulty, weakness, light-headedness, numbness and headaches.  Hematological: Negative for adenopathy. Does not bruise/bleed easily.  Psychiatric/Behavioral: Negative for hallucinations, behavioral problems, confusion, dysphoric mood, decreased concentration and agitation.    Objective:   Filed Vitals:   05/26/13 1701  BP: 125/84  Pulse: 61  Temp: 99.1 F (37.3 C)  Resp: 16    Physical Exam  Constitutional: Appears obese. No distress.  HENT: Normocephalic. External right and left ear normal. Oropharynx is clear and moist.  Eyes: Conjunctivae and EOM are normal. PERRLA, no scleral icterus.  Neck: Normal ROM. Neck supple. No JVD. No tracheal deviation. No thyromegaly.  CVS: RRR, S1/S2 +, no murmurs, no gallops, no carotid bruit.  Pulmonary: Effort and breath sounds normal, no stridor, rhonchi, wheezes, rales.  Abdominal: Soft. BS +,  no distension, tenderness, rebound or guarding.  Musculoskeletal: Normal range of motion. No edema and no tenderness.  Lymphadenopathy: No lymphadenopathy noted, cervical, inguinal. Neuro: Alert. Normal reflexes, muscle tone coordination. No cranial nerve deficit. Skin: Skin is warm and dry. No rash noted. Not diaphoretic. No erythema. No pallor.  Psychiatric: Normal mood and affect. Behavior, judgment, thought content normal.   Lab Results  Component Value Date   WBC 6.8 02/08/2009   HGB 13.5 02/08/2009   HCT 42.0 02/08/2009   MCV 86.4 02/08/2009   PLT 254 02/08/2009   Lab Results  Component Value Date   CREATININE 0.58 02/08/2009   BUN 11 02/08/2009   NA 140 02/08/2009   K 4.1  02/08/2009   CL 105 02/08/2009   CO2 24 02/08/2009    No results found for this basename: HGBA1C   Lipid Panel  No results found for this basename: chol, trig, hdl, cholhdl, vldl, ldlcalc       Assessment and plan:   Amenorrhea - Plan: POCT urine pregnancy, Ambulatory referral to Obstetrics / Gynecology, Glucose (CBG), HgB A1c  PCOS (polycystic ovarian syndrome) - Plan: Ambulatory referral to Obstetrics / Gynecology, Glucose (CBG), HgB A1c  Amenorrhea - Plan: POCT urine pregnancy, Ambulatory referral to Obstetrics / Gynecology, Glucose (CBG), HgB A1c  PCOS (polycystic ovarian syndrome) - Plan: Ambulatory referral to Obstetrics / Gynecology, Glucose (CBG), HgB A1c  Insulin resistance  History of repair of coarctation of aorta  Pregnancy test negative.   Refer to [facility] for evaluation  Trial of metformin ER to see if she may tolerate it better.  RTC in 4 months  The patient was given clear instructions to go to ER or return to medical center if symptoms don't improve, worsen or new problems develop.  The patient verbalized understanding.  The patient was told to call to get any lab results if not heard anything in the next week.    Rodney Langton, MD, CDE, FAAFP [facility] [place], [place]

## 2013-05-26 NOTE — Progress Notes (Signed)
Patient presents to establish care; states she has not had a menstrual cycle in 6 months; states that she had a PAP at [facility] hospital May 28th with labs but never got results.  Also may need cardiac referral due to heart issues.

## 2013-07-15 ENCOUNTER — Ambulatory Visit (INDEPENDENT_AMBULATORY_CARE_PROVIDER_SITE_OTHER): Payer: No Typology Code available for payment source | Admitting: Obstetrics & Gynecology

## 2013-07-15 ENCOUNTER — Encounter: Payer: Self-pay | Admitting: Obstetrics & Gynecology

## 2013-07-15 VITALS — BP 125/82 | HR 74 | Temp 97.4°F | Ht 62.0 in | Wt 213.4 lb

## 2013-07-15 DIAGNOSIS — N915 Oligomenorrhea, unspecified: Secondary | ICD-10-CM

## 2013-07-15 DIAGNOSIS — E282 Polycystic ovarian syndrome: Secondary | ICD-10-CM

## 2013-07-15 NOTE — Progress Notes (Signed)
Subjective:     Patient ID: Kimberly Reid, female   DOB: 20-Sep-1991, 22 y.o.   MRN: [id number]  HPI Pt presents for f/u of PCOS.  She is currently on Metformin.  Her LMP was 06/08/2013.  She has cycles q 3 months.  She reports that she get dizzy if she takes meds more than 500mg  at a time so she has been taking them qid.   Review of Systems     Objective:   Physical Exam BP 125/82  Pulse 74  Temp(Src) 97.4 F (36.3 C) (Oral)  Ht 5\' 2"  (1.575 m)  Wt 213 lb 6.4 oz (96.798 kg)  BMI 39.02 kg/m2  LMP 06/08/2013 Pt in NAD Exam deferred  04/01/2013 Clinical Data: Oligomenorrhea. Irregular menses since age 17. LMP  01/26/2013  TRANSABDOMINAL AND TRANSVAGINAL ULTRASOUND OF PELVIS  Technique: Both transabdominal and transvaginal ultrasound  examinations of the pelvis were performed. Transabdominal technique  was performed for global imaging of the pelvis including uterus,  ovaries, adnexal regions, and pelvic cul-de-sac.  It was necessary to proceed with endovaginal exam following the  transabdominal exam to visualize the myometrium, endometrium and  adnexa.  Comparison: None  Findings:  Uterus: Is anteverted and anteflexed and demonstrates a sagittal  length of 6.9 cm, depth of 2.8 cm and width of 3.8 cm. A  homogeneous myometrium is seen  Endometrium: Appears homogeneously echogenic with a width of 8.2  mm. No areas of focal thickening or heterogeneity are seen and  this correlates with the expected presecretory phase of the cycle  with provided LMP of 01/26/2013.  Right ovary: Is best seen transabdominally and has a normal  appearance measuring 2.4 x 1.7 x 2.1 cm (4.4 cc)  Left ovary: Has a normal appearance measuring 2.5 x 2.1 x 2.3 cm.  A paucity of follicles are seen. (6.3 cc)  Other findings: No pelvic fluid or separate adnexal masses are  seen.  IMPRESSION:  Unremarkable presecretory pelvic ultrasound  Ovaries do not meet sonographic criteria for polycystic ovaries   either by size or follicle count.      Assessment:     PCOS- reveiwed with pt the risks of PCOS (including increased risk of DM, CAD and DM) and the benefits of wt loss     Plan:     D/w pt wt loss Cont Metformin 2gm daily

## 2013-07-15 NOTE — Patient Instructions (Addendum)
Sndrome poliqustico de los ovarios (Polycystic Ovarian Syndrome) El sndrome poliqustico de los ovarios es una enfermedad con una gran cantidad de problemas. Uno de ellos es con los ovarios. Los ovarios son rganos ubicados en la pelvis de la mujer, a cada lado del tero. Normalmente, durante el ciclo menstrual, se libera un vulo de un ovario cada mes. Esto se denomina ovulacin. Cuando un vulo es fertilizado, se dirige al tero, donde comienza a desarrollarse el beb. El vulo debe ir a travs de la trompa de Falopio hacia la matriz (tero). Los ovarios tambin producen las hormonas estrgeno y Education officer, museum. Estas hormonas Primary school teacher de los pechos de la mujer, la forma de su cuerpo y Herbalist. Tambin regulan el ciclo menstrual y Firefighter. A veces, se forman quistes en los ovarios. Un quiste es una cavidad llena de lquido. En el ovario, pueden formarse diferentes tipos de quistes. El tipo ms frecuente de quiste de ovario se denomina quiste de la ovulacin o funcional. Es normal y generalmente se forma durante el ciclo menstrual habitual. A menudo se forma en un ciclo menstrual normal. Cada mes, en los ovarios de la mujer crecen quistes pequeos que contienen los vulos. Cuando el vulo crece por completo, se abre el saco que lo contiene. Este libera al vulo. El saco luego se disuelve. En un tipo de quiste funcional, denominado quiste folicular, el saco no se rompe para liberar el vulo. Podra continuar creciendo. Esto tipos de quistes generalmente desaparecen entre 1 y 3 meses.  Un tipo de problema de quistes en los ovarios se denomina Sndrome Poliqustico de los Ovarios (SPQO). En esta enfermedad, se forman mucho quistes en el folculo, pero no se rompe ni produce un vulo. Este problema de salud afecta a lo siguiente:  El ciclo menstrual  El corazn.  Obesidad.  Cncer en el tero.  La fertilidad.  Vasos sanguneos.  Crecimiento del vello (en el rostro y el  cuerpo) o calvicie.  Hormonas.  Apariencia.  Hipertensin arterial.  Ictus.  Produccin de insulina.  Inflamacin del hgado.  Colesterol y triglicridos elevados en sangre. CAUSAS  No se conoce la causa exacta de esta enfermedad.  Las mujeres con SPQO a menudo tienen la madre o una hermana con [place]. Esto no es prueba suficiente para decir que es hereditario.  Muchas mujeres con SPQO tienen problemas de [place].  Los investigadores estn estudiando la relacin entre el SPQO y la capacidad del cuerpo para producir insulina. La insulina es una hormona que regula los cambios de International aid/development worker, almidn y otro alimentos a energa para su utilizacin o almacenamiento en el cuerpo. Algunas mujeres con SPQO producen demasiada insulina. Es posible que los ovarios reaccionen al producir muchas hormonas masculinas, denominadas andrgenos. Esto puede llevar al acn, crecimiento excesivo del vello, aumento de peso y problemas de ovulacin.  Demasiada produccin de la hormona luteinizante (LH) de la glndula pituitaria en el cerebro estimula al ovario para producir demasiado hormona masculina (andrgenos). SNTOMAS  Poco frecuente o ningn perodo menstrual o hemorragias irregulares.  Incapacidad para quedar embarazada (infertilidad) debido a la falta de ovulacin.  Aumento del crecimiento del vello en el rostro, el trax, el [address], la espalda, los muslos o los dedos de los pies.  Acn, piel grasa o caspa.  Dolor en la pelvis.  Ganancia de peso u obesidad, por lo general se acumula grasa alrededor de Lobbyist.  Diabetes tipo 2 (generalmente no requiere insulina).  Colesterol elevado  Presin arterial elevada.  Patrn masculino de calvicie o cabello fino.  Manchas marrn oscuro o negras en la piel del cuello, brazos, pechos o muslos.  Marcas, o pequeos excesos de piel en la zona del cuello o las [place].  Apnea del sueo Esto es ronquidos excesivos e interrupciones de la respiracin  cuando se est dormido.  La voz se hace ms grave.  Diabetes gestacional durante el embarazo.  Aumenta el riesgo de aborto espontneo. DIAGNSTICO No hay ninguna prueba que pueda diagnosticar esta enfermedad.   El profesional:  Observar el historial clnico.  Education officer, environmental un examen plvico  Indicar una ecografa  Controlar sus niveles de hormonas femeninas y [place].  Medir los niveles de azcar o glucosa en la sangre.  Realizar otros anlisis de [place].  Si est produciendo muchas hormonas masculinas, el mdico se asegurar que se deba al SPQO. En el examen fsico, el mdico querr [employer] zonas de aumento de vello. Trate de Social worker natural del vello algunos das antes de la visita.  Durante el examen plvico, los ovarios pueden estar agrandados o inflamados debido al gran nmero de pequeos quistes. Esto podr verse ms fcilmente mediante un ultrasonido o examen vaginal para ver si hay quistes en los ovarios y las paredes del tero (endometrio). La pared uterina puede volverse ms gruesa si no ha habido un perodo regular. TRATAMIENTO Debido a que no hay cura para el SPQO, es necesario controlarlo para Physiological scientist. Los tratamientos se basan en los sntomas. Tambin se basa en su deseo de tener un beb o usar anticonceptivos.  Los tratamientos pueden ser:  Hormona progesterona, para iniciar un periodo menstrual.  Pldoras anticonceptivas, para Orthoptist.  Medicamentos para estimular la ovulacin, si quiere quedar embarazada.  Medicamentos para Air traffic controller.  Medicamentos para Scientist, physiological presin arterial.  Medicamentos y dietas para Chief Operating Officer los altos niveles de colesterol y triglicridos en la sangre.  La ciruga, realizando pequeos cortes en el ovario, para disminuir la produccin de hormona masculina. Esto se realiza a travs de un tubo largo luminoso (laparoscopio) y se coloc en la pelvis a travs  de una pequea incisin en la zona inferior del abdomen. El profesional que la asiste comentar con usted las opciones a Public relations account executive. LAS MUJERES CON SPQO TIENEN ESTAS CARACTERSTICAS:  Altos niveles de hormonas masculinas denominada andrgenos.  Perodos menstruales irregulares o ausentes.  Pueden tener pequeos [employer]O. El SPQO es el problema hormonal reproductivo ms comn en las mujeres de edad frtil. PORQUE LAS MUJERES CON SNDROME POLI QUSTICO TIENEN PROBLEMAS CON SU CICLO MENSTRUAL Todos los meses, comienzan a Customer service manager en los ovarios alrededor de 20 vulos. Cuando un vulo crece y [place], el folculo se abre para liberarlo para que pueda viajar a travs de las trompas de Falopio para Tree surgeon. Cuando un vulo sale del folculo, comienza la ovulacin. En las mujeres con PQO, el ovario no produce todas las hormonas necesarias para que cualquiera de los vulos [place].Pueden comenzar a desarrollarse y Counselling psychologist lquido, pero ninguno se desarrolla lo suficiente. En vez de eso, algunos permanecen como quistes. Como no madura ni se libera ningn vulo, no ocurre la ovulacin y se produce la hormona progesterona. Sin progesterona, el ciclo menstrual es irregular o ausente. Adems, el quiste produce hormonas masculinas, que continan evitando la ovulacin.  Document Released: 01/30/2009 Document Revised: 01/06/2012 [facility] Patient Information 2014 [place], [place]. Ejercicios para perder peso (Exercise to Lose Weight) La actividad fsica y [place]omia Dear dieta saludable ayudan a perder peso.  El mdico podr sugerirle ejercicios especficos. IDEAS Y CONSEJOS PARA HACER EJERCICIOS  Elija opciones econmicas que disfrute hacer , como caminar, andar en bicicleta o los vdeos para ejercitarse.  Utilice las Microbiologist del ascensor.  Camine durante la hora del almuerzo.  Estacione el auto lejos del lugar de [place] o [place].  Concurra a un gimnasio o tome clases de  gimnasia.  Comience con 5  10 minutos de actividad fsica por da. Ejercite hasta 30 minutos, 4 a 6 das por [address]  Utilice zapatos que tengan un buen soporte y ropas cmodas.  Elongue antes y despus de Company secretary.  Ejercite hasta que aumente la respiracin y el corazn palpite rpido.  Beba agua extra cuando ejercite.  No haga ejercicio Firefighter, sentirse mareado o que le falte mucho el aire. La actividad fsica puede quemar alrededor de 150 caloras.  Correr 20 cuadras en 15 minutos.  Jugar vley durante 45 a 60 minutos.  Limpiar y encerar el auto durante 45 a 60 minutos.  Jugar ftbol americano de toque.  Caminar 25 cuadras en 35 minutos.  Empujar un cochecito 20 cuadras en 30 minutos.  Jugar baloncesto durante 30 minutos.  Rastrillar hojas secas durante 30 minutos.  Andar en bicicleta 80 cuadras en 30 minutos.  Caminar 30 cuadras en 30 minutos.  Bailar durante 30 minutos.  Quitar la nieve con una pala durante 15 minutos.  Nadar vigorosamente durante 20 minutos.  Subir escaleras durante 15 minutos.  Andar en bicicleta 60 cuadras durante 15 minutos.  Arreglar el jardn entre 30 y 45 minutos.  Saltar a la soga durante 15 minutos.  Limpiar vidrios o pisos durante 45 a 60 minutos. Document Released: 01/18/2011 Document Revised: 01/06/2012 [facility] Patient Information 2014 [place], [place].

## 2013-09-02 ENCOUNTER — Ambulatory Visit: Payer: Self-pay | Attending: Internal Medicine | Admitting: Pharmacist

## 2013-09-02 DIAGNOSIS — Z23 Encounter for immunization: Secondary | ICD-10-CM | POA: Insufficient documentation

## 2013-09-02 NOTE — Progress Notes (Signed)
Patient here for flu vaccine only.

## 2014-02-22 ENCOUNTER — Ambulatory Visit: Payer: Self-pay

## 2014-03-07 ENCOUNTER — Ambulatory Visit: Payer: Self-pay

## 2014-03-31 ENCOUNTER — Ambulatory Visit: Payer: No Typology Code available for payment source | Attending: Internal Medicine

## 2014-04-25 ENCOUNTER — Ambulatory Visit: Payer: No Typology Code available for payment source | Attending: Internal Medicine | Admitting: Internal Medicine

## 2014-04-25 ENCOUNTER — Encounter: Payer: Self-pay | Admitting: Internal Medicine

## 2014-04-25 VITALS — BP 112/74 | HR 80 | Temp 97.8°F | Resp 24 | Ht 63.5 in | Wt 217.8 lb

## 2014-04-25 DIAGNOSIS — E282 Polycystic ovarian syndrome: Secondary | ICD-10-CM | POA: Insufficient documentation

## 2014-04-25 DIAGNOSIS — Z79899 Other long term (current) drug therapy: Secondary | ICD-10-CM | POA: Insufficient documentation

## 2014-04-25 DIAGNOSIS — R011 Cardiac murmur, unspecified: Secondary | ICD-10-CM | POA: Insufficient documentation

## 2014-04-25 DIAGNOSIS — R197 Diarrhea, unspecified: Secondary | ICD-10-CM | POA: Insufficient documentation

## 2014-04-25 DIAGNOSIS — R42 Dizziness and giddiness: Secondary | ICD-10-CM | POA: Insufficient documentation

## 2014-04-25 MED ORDER — METFORMIN HCL ER 750 MG PO TB24: 750.0000 mg | ORAL_TABLET | Freq: Every day | ORAL | Status: DC

## 2014-04-25 MED ORDER — METFORMIN HCL ER 500 MG PO TB24
1000.0000 mg | ORAL_TABLET | Freq: Two times a day (BID) | ORAL | Status: DC
Start: 2014-04-25 — End: 2014-04-25

## 2014-04-25 NOTE — Patient Instructions (Signed)
Sndrome ovrico poliqustico (Polycystic Ovarian Syndrome) El sndrome ovrico poliqustico (PCOS) es un trastorno hormonal comn en mujeres en edad reproductiva. La mayora de las mujeres con este sndrome desarrolla pequeos quistes en sus ovarios. Esto puede ocasionar problemas en la menstruacin y dificultades para quedar embarazada. Tambin puede aumentar el riesgo de aborto espontneo. Si no se trata, el sndrome ovrico poliqustico puede acarrear graves problemas de [place], como diabetes y enfermedades del Training and development officer. CAUSAS La causa de este sndrome no se conoce, pero podra haber un factor gentico. SIGNOS Y SNTOMAS   Perodos menstruales irregulares o ausentes.   Incapacidad para quedar embarazada (infertilidad) debido a la falta de ovulacin   Aumento del crecimiento del vello en el rostro, el trax, el estmago, la espalda, los muslos o los dedos de los pies.   Acn, piel grasa o caspa.   Dolor plvico.   Ganancia de peso u obesidad, por lo general, sobrepeso localizado alrededor de la cintura.   Diabetes tipo 2.   Colesterol elevado.   Hipertensin arterial.   Calvicie femenina o cabello muy fino.   Manchas de piel gruesa marrn oscura o negra en el cuello, brazos, pechos o caderas.   Pequeos excesos de piel suelta (plipos cutneos) en las axilas o en el pecho.   Ronquidos excesivos e interrupcin de la respiracin durante el sueo (apnea del sueo).   Tonalidad grave de Microbiologist.   Diabetes gestacional durante el embarazo.  DIAGNSTICO  No hay una nica prueba para diagnosticar el sndrome ovrico poliqustico.   El profesional que lo asiste:   Optometrist una historia clnica.   Realizar un examen plvico   Realizar una prueba de [place].   Controlar sus niveles de hormonas femeninas y [place].   Medir la glucosa o los niveles de Museum/gallery exhibitions officer.   Realizar otros anlisis de [place].   Si est produciendo demasiadas  hormonas masculinas, el mdico se asegurar de que se debe al sndrome ovrico poliqustico. Durante el examen fsico, el mdico evaluar las zonas de aumento de vello. Permita que se Hydrographic surveyor natural del vello [employer] previos a la visita.   Durante el examen plvico, los ovarios podrn agrandarse o hincharse debido al aumento del nmero de pequeos quistes. Esto puede observarse ms fcilmente mediante la utilizacin de ultrasonido vaginal para examinar los ovarios y las paredes del tero (endometrio) en busca de quistes. Las paredes del tero pueden engrosarse si no tiene un perodo menstrual regular.  TRATAMIENTO  Debido a que no hay cura para el sndrome ovrico poliqustico, debe controlarse para Customer service manager. Los tratamientos se basan en los sntomas. Tambin se basa en su deseo de tener un beb o usar anticonceptivos.  El tratamiento puede incluir:   Hormona progesterona, para iniciar un periodo menstrual.   Pldoras anticonceptivas, para Contractor.   Medicamentos para estimular la ovulacin, si quiere quedar embarazada.   Medicamentos para Optician, dispensing.   Medicamentos para Aeronautical engineer presin arterial.   Medicamentos y dietas para Chief Technology Officer los altos niveles de colesterol y triglicridos en la sangre.  Medicamentos para reducir el crecimiento excesivo de vello.  Ciruga, realizando pequeos cortes en el ovario, para disminuir la produccin de hormona masculina. Esto se realiza a travs de un tubo largo que ilumina (laparoscopio) que se coloca en la pelvis a travs de una pequea incisin en la zona inferior del abdomen.  [place] slo medicamentos de venta libre o recetados,  segn las indicaciones del mdico.  Preste atencin a su alimentacin y a sus niveles de [place] Vistaactividad fsica. Esto puede ayudar a Web designerreducir los efectos del sndrome ovrico poliqustico.  Controle su  [place].  Consuma alimentos bajos en carbohidratos y [address]os en contenido de [place]ofibra.  Haga ejercicios regularmente. SOLICITE ATENCIN MDICA SI:  Los sntomas no mejoran con los United Parcelmedicamentos.  Aparecen nuevos sntomas. Document Released: 01/30/2009 Document Revised: 08/04/2013 [facility] Patient Information 2015 [place], [place]. This information is not intended to replace advice given to you by your health care provider. Make sure you discuss any questions you have with your health care provider.

## 2014-04-25 NOTE — Progress Notes (Signed)
Patient ID: Kimberly SpikesKaren Balderas Reid, female   DOB: [birth year], 23 y.o.   MRN: [id number]  CC: follow up  HPI:  Patient reports that she has polycystic ovarian syndrome and was started on Metformin.  Patient reports that when she losses weight it rapidly comes back.  Reports that her menses last from 15 to 30 days, very light, with some right sided ovarian pain.  Patient desires pregnancy at this time.  Patient reports diarrhea, upset stomach, and dizziness since Metformin increased to 1,000 mg BID.   No Known Allergies Past Medical History  Diagnosis Date  . 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 Heart murmur   . Polycystic ovary syndrome 03/24/2013   Current Outpatient Prescriptions on File Prior to Visit  Medication Sig Dispense Refill  . metFORMIN (GLUCOPHAGE XR) 500 MG 24 hr tablet Take 2 tablets (1,000 mg total) by mouth 2 (two) times daily with a meal.  120 tablet  1   No current facility-administered medications on file prior to visit.   Family History  Problem Relation Age of Onset  . Diabetes Mother    History   Social History  . Marital Status: Married    Spouse Name: N/A    Number of Children: N/A  . Years of Education: N/A   Occupational History  . Not on file.   Social History Main Topics  . Smoking status: Passive Smoke Exposure - Never Smoker  . Smokeless tobacco: Never Used  . Alcohol Use: Yes  . Drug Use: No  . Sexual Activity: Yes    Birth Control/ Protection: None   Other Topics Concern  . Not on file   Social History Narrative  . No narrative on file    Review of Systems: Constitutional: Negative for fever, chills, diaphoresis, activity change, appetite change and fatigue. HENT: Negative for ear pain, nosebleeds, congestion, facial swelling, rhinorrhea, neck pain, neck stiffness and ear discharge.  Eyes: Negative for pain, discharge, redness, itching and visual disturbance. Respiratory: Negative for cough, choking, chest tightness,  shortness of breath, wheezing and stridor.  Cardiovascular: Negative for chest pain, palpitations and leg swelling. Gastrointestinal: Negative for abdominal distention. Genitourinary: Negative for dysuria, urgency, frequency, hematuria, flank pain, decreased urine volume, difficulty urinating and dyspareunia.  Musculoskeletal: Negative for back pain, joint swelling, arthralgias and gait problem. Neurological: Negative for dizziness, tremors, seizures, syncope, facial asymmetry, speech difficulty, weakness, light-headedness, numbness and headaches.  Hematological: Negative for adenopathy. Does not bruise/bleed easily. Psychiatric/Behavioral: Negative for hallucinations, behavioral problems, confusion, dysphoric mood, decreased concentration and agitation.    Objective:   Filed Vitals:   04/25/14 1646  BP: 112/74  Pulse: 80  Temp: 97.8 F (36.6 C)  Resp: 24   Physical Exam  Constitutional: She is oriented to person, place, and time.  Neck: Normal range of motion. Neck supple.  Cardiovascular: Normal rate, regular rhythm and normal heart sounds.   Pulmonary/Chest: Effort normal and breath sounds normal.  Abdominal: Soft. Bowel sounds are normal. There is tenderness (right pelvic area).  Musculoskeletal: Normal range of motion.  Neurological: She is alert and oriented to person, place, and time.  Skin: Skin is warm and dry.     Lab Results  Component Value Date   WBC 6.8 02/08/2009   HGB 13.5 02/08/2009   HCT 42.0 02/08/2009   MCV 86.4 02/08/2009   PLT 254 02/08/2009   Lab Results  Component Value Date   CREATININE 0.58 02/08/2009   BUN 11  02/08/2009   NA 140 02/08/2009   K 4.1 02/08/2009   CL 105 02/08/2009   CO2 24 02/08/2009    Lab Results  Component Value Date   HGBA1C 5.1% 05/26/2013   Lipid Panel  No results found for this basename: chol, trig, hdl, cholhdl, vldl, ldlcalc       Assessment and plan:   Kimberly Reid was seen today for follow-up.  Diagnoses and associated  orders for this visit:  PCOS (polycystic ovarian syndrome) - Discontinue: metFORMIN (GLUCOPHAGE XR) 500 MG 24 hr tablet; Take 2 tablets (1,000 mg total) by mouth 2 (two) times daily with a meal. - Switch to metFORMIN (GLUCOPHAGE XR) 750 MG 24 hr tablet; Take 1 tablet (750 mg total) by mouth daily with breakfast. Urged patient to begin taking prenatal vitamins daily Stressed weight loss to reduce risk of pregnancy complications  Return in about 6 months (around 10/25/2014) for PCOS.    Holland CommonsKECK, VALERIE, NP-C [facility] [phone] 05/01/2014, 5:23 PM

## 2014-04-25 NOTE — Progress Notes (Signed)
Patient presents for F/U on PCOS and med refills. C/O menses lasting 15-30 days Interpreter present

## 2014-06-30 ENCOUNTER — Ambulatory Visit: Payer: No Typology Code available for payment source

## 2014-08-08 ENCOUNTER — Encounter: Payer: Self-pay | Admitting: Obstetrics & Gynecology

## 2014-08-08 ENCOUNTER — Ambulatory Visit (INDEPENDENT_AMBULATORY_CARE_PROVIDER_SITE_OTHER): Payer: Self-pay | Admitting: Physician Assistant

## 2014-08-08 VITALS — BP 114/70 | HR 75 | Ht 62.5 in | Wt 212.3 lb

## 2014-08-08 DIAGNOSIS — Z309 Encounter for contraceptive management, unspecified: Secondary | ICD-10-CM

## 2014-08-08 DIAGNOSIS — Z3009 Encounter for other general counseling and advice on contraception: Secondary | ICD-10-CM

## 2014-08-08 DIAGNOSIS — E282 Polycystic ovarian syndrome: Secondary | ICD-10-CM

## 2014-08-08 LAB — POCT URINALYSIS DIP (DEVICE)
BILIRUBIN URINE: NEGATIVE
Glucose, UA: NEGATIVE mg/dL
KETONES UR: NEGATIVE mg/dL
Leukocytes, UA: NEGATIVE
Nitrite: NEGATIVE
PROTEIN: NEGATIVE mg/dL
Urobilinogen, UA: 0.2 mg/dL (ref 0.0–1.0)
pH: 5 (ref 5.0–8.0)

## 2014-08-08 MED ORDER — NORETHIN ACE-ETH ESTRAD-FE 1.5-30 MG-MCG PO TABS: 1.0000 | ORAL_TABLET | Freq: Every day | ORAL | Status: DC

## 2014-08-08 NOTE — Progress Notes (Signed)
Patient states that she made the appointment to followup on her PCOS and for a pap. I informed her that she is not yet due for a pap smear last done 02/2013 and was normal.

## 2014-08-08 NOTE — Progress Notes (Signed)
Patient ID: Kimberly Reid, female   DOB: [birth year], 23 y.o.   MRN: [id number] History:  Kimberly Reid is a 23 y.o. G0P0000 who presents to clinic today for eval of PCOS.  She has continued to use Metformin 500mg  QID which is provided from PCP.  She desires pregnancy and therefore is not on any hormonal therapies.  She has not tried to lose weight.   Most of the year, she was having menstrual bleeding approximately once every 3 months.  Since May she has been having bleeding nearly every day.  She is unaware of any changes in medication, activity, diet, etc that led to this change.  She denies abdominal pain, weakness, tenderness, lightheadedness, headache, chest pain, SOB.     The following portions of the patient's history were reviewed and updated as appropriate: allergies, current medications, past family history, past medical history, past social history, past surgical history and problem list.  Review of Systems:  Pertinent items are noted in HPI.  Objective:  Physical Exam BP 114/70  Pulse 75  Ht 5' 2.5" (1.588 m)  Wt 212 lb 4.8 oz (96.299 kg)  BMI 38.19 kg/m2  LMP 08/01/2014 GENERAL: Well-developed, well-nourished female in no acute distress.  HEENT: Normocephalic, atraumatic. EOM intact.   NECK: Supple. Normal  ROM. LUNGS: Normal rate. Clear to auscultation bilaterally.  HEART: Regular rate and rhythm with no adventitious sounds.  ABDOMEN: Soft, nontender, nondistended. No organomegaly. Normal bowel sounds appreciated in all quadrants.  EXTREMITIES: No cyanosis, clubbing, or edema, 2+ distal pulses.   Labs and Imaging No results found.  Assessment & Plan:  Assessment: Irregular menstrual bleeding in the setting of PCOS  Plans: OCP x 1 pack to help stop the continuous bleeding.  If this is unsuccessful, may RTC for additional eval.  Work toward weight loss with diet and exercise in addition to metformin. RTC 1 year for follow up  Bertram DenverKaren E Teague Clark,  PA-C 08/08/2014 3:32 PM

## 2014-08-08 NOTE — Patient Instructions (Signed)
Sndrome ovrico poliqustico (Polycystic Ovarian Syndrome) El sndrome ovrico poliqustico (PCOS) es un trastorno hormonal comn en mujeres en edad reproductiva. La mayora de las mujeres con este sndrome desarrolla pequeos quistes en sus ovarios. Esto puede ocasionar problemas en la menstruacin y dificultades para quedar embarazada. Tambin puede aumentar el riesgo de aborto espontneo. Si no se trata, el sndrome ovrico poliqustico puede acarrear graves problemas de [place], como diabetes y enfermedades del Training and development officer. CAUSAS La causa de este sndrome no se conoce, pero podra haber un factor gentico. SIGNOS Y SNTOMAS   Perodos menstruales irregulares o ausentes.   Incapacidad para quedar embarazada (infertilidad) debido a la falta de ovulacin   Aumento del crecimiento del vello en el rostro, el trax, el estmago, la espalda, los muslos o los dedos de los pies.   Acn, piel grasa o caspa.   Dolor plvico.   Ganancia de peso u obesidad, por lo general, sobrepeso localizado alrededor de la cintura.   Diabetes tipo 2.   Colesterol elevado.   Hipertensin arterial.   Calvicie femenina o cabello muy fino.   Manchas de piel gruesa marrn oscura o negra en el cuello, brazos, pechos o caderas.   Pequeos excesos de piel suelta (plipos cutneos) en las axilas o en el pecho.   Ronquidos excesivos e interrupcin de la respiracin durante el sueo (apnea del sueo).   Tonalidad grave de Microbiologist.   Diabetes gestacional durante el embarazo.  DIAGNSTICO  No hay una nica prueba para diagnosticar el sndrome ovrico poliqustico.   El profesional que lo asiste:   Optometrist una historia clnica.   Realizar un examen plvico   Realizar una prueba de [place].   Controlar sus niveles de hormonas femeninas y [place].   Medir la glucosa o los niveles de Museum/gallery exhibitions officer.   Realizar otros anlisis de [place].   Si est produciendo demasiadas  hormonas masculinas, el mdico se asegurar de que se debe al sndrome ovrico poliqustico. Durante el examen fsico, el mdico evaluar las zonas de aumento de vello. Permita que se Hydrographic surveyor natural del vello [employer] previos a la visita.   Durante el examen plvico, los ovarios podrn agrandarse o hincharse debido al aumento del nmero de pequeos quistes. Esto puede observarse ms fcilmente mediante la utilizacin de ultrasonido vaginal para examinar los ovarios y las paredes del tero (endometrio) en busca de quistes. Las paredes del tero pueden engrosarse si no tiene un perodo menstrual regular.  TRATAMIENTO  Debido a que no hay cura para el sndrome ovrico poliqustico, debe controlarse para Customer service manager. Los tratamientos se basan en los sntomas. Tambin se basa en su deseo de tener un beb o usar anticonceptivos.  El tratamiento puede incluir:   Hormona progesterona, para iniciar un periodo menstrual.   Pldoras anticonceptivas, para Contractor.   Medicamentos para estimular la ovulacin, si quiere quedar embarazada.   Medicamentos para Optician, dispensing.   Medicamentos para Aeronautical engineer presin arterial.   Medicamentos y dietas para Chief Technology Officer los altos niveles de colesterol y triglicridos en la sangre.  Medicamentos para reducir el crecimiento excesivo de vello.  Ciruga, realizando pequeos cortes en el ovario, para disminuir la produccin de hormona masculina. Esto se realiza a travs de un tubo largo que ilumina (laparoscopio) que se coloca en la pelvis a travs de una pequea incisin en la zona inferior del abdomen.  [place] slo medicamentos de venta libre o recetados,  segn las indicaciones del mdico.  Preste atencin a su alimentacin y a sus niveles de actividad fsica. Esto puede ayudar a reducir los efectos del sndrome ovrico poliqustico.  Controle su  peso.  Consuma alimentos bajos en carbohidratos y altos en contenido de fibra.  Haga ejercicios regularmente. SOLICITE ATENCIN MDICA SI:  Los sntomas no mejoran con los medicamentos.  Aparecen nuevos sntomas. Document Released: 01/30/2009 Document Revised: 08/04/2013 ExitCare Patient Information 2015 [employer]. This information is not intended to replace advice given to you by your health care provider. Make sure you discuss any questions you have with your health care provider.  

## 2014-09-05 ENCOUNTER — Ambulatory Visit: Payer: No Typology Code available for payment source

## 2014-10-18 ENCOUNTER — Ambulatory Visit: Payer: Self-pay | Admitting: Internal Medicine

## 2015-03-09 ENCOUNTER — Ambulatory Visit: Payer: Self-pay | Attending: Internal Medicine

## 2015-03-30 ENCOUNTER — Ambulatory Visit: Payer: Self-pay | Attending: Internal Medicine | Admitting: Internal Medicine

## 2015-03-30 ENCOUNTER — Encounter: Payer: Self-pay | Admitting: Internal Medicine

## 2015-03-30 VITALS — BP 111/74 | HR 83 | Temp 98.2°F | Resp 16 | Wt 213.4 lb

## 2015-03-30 DIAGNOSIS — H9201 Otalgia, right ear: Secondary | ICD-10-CM

## 2015-03-30 DIAGNOSIS — H7291 Unspecified perforation of tympanic membrane, right ear: Secondary | ICD-10-CM

## 2015-03-30 DIAGNOSIS — E282 Polycystic ovarian syndrome: Secondary | ICD-10-CM

## 2015-03-30 DIAGNOSIS — H9191 Unspecified hearing loss, right ear: Secondary | ICD-10-CM

## 2015-03-30 LAB — CBC
HEMATOCRIT: 39.5 % (ref 36.0–46.0)
HEMOGLOBIN: 13.3 g/dL (ref 12.0–15.0)
MCH: 28.4 pg (ref 26.0–34.0)
MCHC: 33.7 g/dL (ref 30.0–36.0)
MCV: 84.4 fL (ref 78.0–100.0)
MPV: 10.5 fL (ref 8.6–12.4)
PLATELETS: 292 10*3/uL (ref 150–400)
RBC: 4.68 MIL/uL (ref 3.87–5.11)
RDW: 13.8 % (ref 11.5–15.5)
WBC: 6.4 10*3/uL (ref 4.0–10.5)

## 2015-03-30 LAB — COMPLETE METABOLIC PANEL WITH GFR
ALT: 25 U/L (ref 0–35)
AST: 23 U/L (ref 0–37)
Albumin: 4.3 g/dL (ref 3.5–5.2)
Alkaline Phosphatase: 58 U/L (ref 39–117)
BUN: 13 mg/dL (ref 6–23)
CHLORIDE: 107 meq/L (ref 96–112)
CO2: 29 mEq/L (ref 19–32)
Calcium: 9.5 mg/dL (ref 8.4–10.5)
Creat: 0.81 mg/dL (ref 0.50–1.10)
GFR, Est African American: >89 mL/min
GFR, Est Non African American: >89 mL/min
Glucose, Bld: 100 mg/dL — ABNORMAL HIGH (ref 70–99)
POTASSIUM: 4.9 meq/L (ref 3.5–5.3)
Sodium: 143 mEq/L (ref 135–145)
TOTAL PROTEIN: 6.8 g/dL (ref 6.0–8.3)
Total Bilirubin: 0.7 mg/dL (ref 0.2–1.2)

## 2015-03-30 NOTE — Progress Notes (Signed)
Patient ID: Kimberly Reid, female   DOB: 12/11/1990, 24 y.o.   MRN: [id number]  CC: decreased hearing  HPI: Kimberly Reid is a 24 y.o. female here today for a follow up visit.  Patient has past medical history of PCOS. She states that she has been having difficulty hearing out of her right ear for the past 3 weeks. She notes that she had a respiratory infection 3 weeks ago and noticed difficulty hearing after that. She has tried to clean her ears but has not noticed a difference in hearing. She denies ear trauma.  Patient has No headache, No chest pain, No abdominal pain - No Nausea, No new weakness tingling or numbness, No Cough - SOB.  No Known Allergies Past Medical History  Diagnosis Date  . 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 Heart murmur   . Polycystic ovary syndrome 03/24/2013   Current Outpatient Prescriptions on File Prior to Visit  Medication Sig Dispense Refill  . metFORMIN (GLUCOPHAGE XR) 750 MG 24 hr tablet Take 1 tablet (750 mg total) by mouth daily with breakfast. 30 tablet 4  . norethindrone-ethinyl estradiol-iron (MICROGESTIN FE,GILDESS FE,LOESTRIN FE) 1.5-30 MG-MCG tablet Take 1 tablet by mouth daily. 1 Package 11   No current facility-administered medications on file prior to visit.   Family History  Problem Relation Age of Onset  . Diabetes Mother    History   Social History  . Marital Status: Married    Spouse Name: N/A  . Number of Children: N/A  . Years of Education: N/A   Occupational History  . Not on file.   Social History Main Topics  . Smoking status: Passive Smoke Exposure - Never Smoker  . Smokeless tobacco: Never Used  . Alcohol Use: Yes  . Drug Use: No  . Sexual Activity: Yes    Birth Control/ Protection: None   Other Topics Concern  . Not on file   Social History Narrative    Review of Systems  Constitutional: Negative for fever and chills.  HENT: Positive for ear pain (Tragus manipulation) and  hearing loss. Negative for congestion and sore throat.   All other systems reviewed and are negative.     Objective:   Filed Vitals:   03/30/15 1047  BP: 111/74  Pulse: 83  Temp: 98.2 F (36.8 C)  Resp: 16    Physical Exam  HENT:  Right Ear: There is tenderness. Tympanic membrane is perforated. Decreased hearing is noted.  Left Ear: External ear normal.  Mouth/Throat: Oropharynx is clear and moist. No oropharyngeal exudate.  Cardiovascular: Normal rate, regular rhythm and normal heart sounds.   Lymphadenopathy:    She has no cervical adenopathy.    Lab Results  Component Value Date   WBC 6.8 02/08/2009   HGB 13.5 02/08/2009   HCT 42.0 02/08/2009   MCV 86.4 02/08/2009   PLT 254 02/08/2009   Lab Results  Component Value Date   CREATININE 0.58 02/08/2009   BUN 11 02/08/2009   NA 140 02/08/2009   K 4.1 02/08/2009   CL 105 02/08/2009   CO2 24 02/08/2009    Lab Results  Component Value Date   HGBA1C 5.1% 05/26/2013   Lipid Panel  No results found for: CHOL, TRIG, HDL, CHOLHDL, VLDL, LDLCALC     Assessment and plan:   Kimberly Reid was seen today for hearing problem.  Diagnoses and all orders for this visit:  Perforated ear drum, right Will refer  to ENT and will need audiology   Otalgia, right Does not appear to be a active infection. Will defer treatment with otic drops with perforation, If no improvement or symptoms worsen she may return.  Hearing decreased, right Right ear substantially worse, required higher frequency to hear on exam. Results documented in  EMR.  PCOS Continue Metformin. Patient has picked up weight. Went over importance of weight loss  Due to language barrier, an interpreter was present during the history-taking and subsequent discussion (and for part of the physical exam) with this patient.  will need ENT asap.      Holland Commons, NP-C [facility] [phone] 03/30/2015, 11:05 AM

## 2015-03-30 NOTE — Patient Instructions (Signed)
Eardrum Perforation °The eardrum is a thin, round tissue inside the ear that separates the ear canal from the middle ear. This is the tissue that detects sound and enables you to hear. The eardrum can be punctured or torn (perforated). Eardrums generally heal without help and with little or no permanent hearing loss. °CAUSES  °· Sudden pressure changes that happen in situations like scuba diving or flying in an airplane. °· Foreign objects in the ear. °· Inserting a cotton-tipped swab in the ear. °· Loud noise. °· Trauma to the ear. °SYMPTOMS  °· Hearing loss. °· Ear pain. °· Ringing in the ears. °· Discharge or bleeding from the ear. °· Dizziness. °· Vomiting. °· Facial paralysis. °HOME CARE INSTRUCTIONS  °· Keep your ear dry, as this improves healing. Swimming, diving, and showers are not allowed until healing is complete. While bathing, protect the ear by placing a piece of cotton covered with petroleum jelly in the outer ear canal. °· Only take over-the-counter or prescription medicines for pain, discomfort, or fever as directed by your caregiver. °· Blow your nose gently. Forceful blowing increases the pressure in the middle ear and may cause further injury or delay healing. °· Resume normal activities, such as showering, when the perforation has healed. Your caregiver can let you know when this has occurred. °· Talk to your caregiver before flying on an airplane. Air travel is generally allowed with a perforated eardrum. °· If your caregiver has given you a follow-up appointment, it is very important to keep that appointment. Failure to keep the appointment could result in a chronic or permanent injury, pain, hearing loss, and disability. °SEEK IMMEDIATE MEDICAL CARE IF:  °· You have bleeding or pus coming from your ear. °· You have problems with balance, dizziness, nausea, or vomiting. °· You develop increased pain. °· You have a fever. °MAKE SURE YOU:  °· Understand these instructions. °· Will watch your  condition. °· Will get help right away if you are not doing well or get worse. °Document Released: 10/11/2000 Document Revised: 01/06/2012 Document Reviewed: 10/13/2008 °ExitCare® Patient Information ©2015 ExitCare, [employer]. This information is not intended to replace advice given to you by your health care provider. Make sure you discuss any questions you have with your health care provider. ° °

## 2015-03-30 NOTE — Progress Notes (Signed)
C/o hearing problems States she is not hearing well States she had the flu 3 weeks ago and since then she has not been able to hear well out of her right ear Did try to clean ear with q-tip

## 2015-04-20 ENCOUNTER — Telehealth: Payer: Self-pay

## 2015-04-20 NOTE — Telephone Encounter (Signed)
-----   Message from Valerie A Keck, NP sent at 04/07/2015 10:56 AM EDT ----- Labs are within normal limits  

## 2015-04-20 NOTE — Telephone Encounter (Signed)
-----   Message from Ambrose Finland, NP sent at 04/07/2015 10:56 AM EDT ----- Labs are within normal limits

## 2015-04-20 NOTE — Telephone Encounter (Signed)
Nurse called patient, patient verified date of birth. Patient aware of normal labs. Patient voices understanding and has no questions.

## 2015-10-29 NOTE — L&D Delivery Note (Signed)
Patient is 25 y.o. G1P0000 622w2d admitted IOL for gDMA1   Delivery Note At 10:24 AM a viable female was delivered via Vaginal, Spontaneous Delivery (Presentation: LOA ).  APGAR: 8, 9; weight pending .   Placenta status: intact.  Cord: 3 vessel  Without complications  Anesthesia:  epidural Episiotomy: None Lacerations: R vaginal and L vaginal Suture Repair: 3.0 vicryl Est. Blood Loss (mL):  300  Mom to postpartum.  Baby to Couplet care / Skin to Skin.  Leland HerElsia J Yoo 07/16/2016, 10:46 AM      Upon arrival patient was complete and pushing. She pushed with good maternal effort to deliver a healthy baby boy. Baby delivered without difficulty, was noted to have good tone and place on maternal abdomen for oral suctioning, drying and stimulation. Delayed cord clamping performed. Placenta delivered intact with 3V cord. Vaginal canal and perineum was inspected and repaired; hemostatic. Pitocin was started and uterus massaged until bleeding slowed. Counts of sharps, instruments, and lap pads were all correct.   Leland HerElsia J Yoo, DO PGY-1 9/19/201710:46 AM

## 2015-11-18 ENCOUNTER — Inpatient Hospital Stay (HOSPITAL_COMMUNITY): Payer: Self-pay

## 2015-11-18 ENCOUNTER — Encounter (HOSPITAL_COMMUNITY): Payer: Self-pay

## 2015-11-18 ENCOUNTER — Inpatient Hospital Stay (HOSPITAL_COMMUNITY)
Admission: AD | Admit: 2015-11-18 | Discharge: 2015-11-18 | Disposition: A | Discharge status: Home / Self Care | Payer: Self-pay | Source: Ambulatory Visit | Attending: Obstetrics & Gynecology | Admitting: Obstetrics & Gynecology

## 2015-11-18 DIAGNOSIS — A499 Bacterial infection, unspecified: Secondary | ICD-10-CM

## 2015-11-18 DIAGNOSIS — O26899 Other specified pregnancy related conditions, unspecified trimester: Secondary | ICD-10-CM

## 2015-11-18 DIAGNOSIS — R109 Unspecified abdominal pain: Secondary | ICD-10-CM | POA: Insufficient documentation

## 2015-11-18 DIAGNOSIS — Z3A11 11 weeks gestation of pregnancy: Secondary | ICD-10-CM | POA: Insufficient documentation

## 2015-11-18 DIAGNOSIS — O23591 Infection of other part of genital tract in pregnancy, first trimester: Secondary | ICD-10-CM

## 2015-11-18 DIAGNOSIS — B9689 Other specified bacterial agents as the cause of diseases classified elsewhere: Secondary | ICD-10-CM

## 2015-11-18 DIAGNOSIS — N760 Acute vaginitis: Secondary | ICD-10-CM | POA: Insufficient documentation

## 2015-11-18 DIAGNOSIS — O26891 Other specified pregnancy related conditions, first trimester: Secondary | ICD-10-CM | POA: Insufficient documentation

## 2015-11-18 DIAGNOSIS — O98811 Other maternal infectious and parasitic diseases complicating pregnancy, first trimester: Secondary | ICD-10-CM | POA: Insufficient documentation

## 2015-11-18 LAB — HCG, QUANTITATIVE, PREGNANCY: hCG, Beta Chain, Quant, S: 582 m[IU]/mL — ABNORMAL HIGH (ref <5)

## 2015-11-18 LAB — WET PREP, GENITAL
Sperm: NONE SEEN
TRICH WET PREP: NONE SEEN
Yeast Wet Prep HPF POC: NONE SEEN

## 2015-11-18 LAB — CBC
HCT: 40.3 % (ref 36.0–46.0)
Hemoglobin: 13.6 g/dL (ref 12.0–15.0)
MCH: 28.2 pg (ref 26.0–34.0)
MCHC: 33.7 g/dL (ref 30.0–36.0)
MCV: 83.6 fL (ref 78.0–100.0)
PLATELETS: 299 10*3/uL (ref 150–400)
RBC: 4.82 MIL/uL (ref 3.87–5.11)
RDW: 13.4 % (ref 11.5–15.5)
WBC: 8.9 10*3/uL (ref 4.0–10.5)

## 2015-11-18 LAB — URINALYSIS, ROUTINE W REFLEX MICROSCOPIC
Bilirubin Urine: NEGATIVE
Glucose, UA: NEGATIVE mg/dL
Ketones, ur: NEGATIVE mg/dL
LEUKOCYTES UA: NEGATIVE
NITRITE: NEGATIVE
PROTEIN: NEGATIVE mg/dL
Specific Gravity, Urine: 1.02 (ref 1.005–1.030)
pH: 6 (ref 5.0–8.0)

## 2015-11-18 LAB — URINE MICROSCOPIC-ADD ON: Bacteria, UA: NONE SEEN

## 2015-11-18 LAB — POCT PREGNANCY, URINE: PREG TEST UR: POSITIVE — AB

## 2015-11-18 LAB — ABO/RH: ABO/RH(D): O POS

## 2015-11-18 MED ORDER — METRONIDAZOLE 500 MG PO TABS: 500.0000 mg | ORAL_TABLET | Freq: Two times a day (BID) | ORAL | Status: DC

## 2015-11-18 NOTE — MAU Note (Signed)
Patient presents with back pain and abdominal pain that started Wednesday.

## 2015-11-18 NOTE — MAU Note (Deleted)
Notified provider that patient is back from ultrasound  

## 2015-11-18 NOTE — MAU Provider Note (Signed)
History     CSN: [id number]  Arrival date and time: 11/18/15 1222   None     Chief Complaint  Patient presents with  . Back Pain  . Abdominal Pain   HPI 24 y.o. G1P0000 at [redacted]w[redacted]d by LMP w/ LLQ pain and back pain x 3 days. Denies bleeding. Had quant at health department this week and was told that she was around [redacted] weeks pregnant.   Past Medical History  Diagnosis Date  . 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 Heart murmur   . Polycystic ovary syndrome 03/24/2013    Past Surgical History  Procedure Laterality Date  . Cardiac surgery      Family History  Problem Relation Age of Onset  . Diabetes Mother     Social History  Substance Use Topics  . Smoking status: Passive Smoke Exposure - Never Smoker  . Smokeless tobacco: Never Used  . Alcohol Use: Yes    Allergies: No Known Allergies  No prescriptions prior to admission    Review of Systems  Constitutional: Negative.   Respiratory: Negative.   Cardiovascular: Negative.   Gastrointestinal: Positive for abdominal pain and constipation. Negative for nausea, vomiting and diarrhea.  Genitourinary: Negative for dysuria, urgency, frequency, hematuria and flank pain.       Negative for vaginal bleeding, vaginal discharge, dyspareunia  Musculoskeletal: Negative.   Neurological: Negative.   Psychiatric/Behavioral: Negative.    Physical Exam   Blood pressure 125/70, pulse 55, temperature 98.6 F (37 C), temperature source Oral, resp. rate 16, last menstrual period 08/31/2015.  Physical Exam  Constitutional: She is oriented to person, place, and time. She appears well-developed and well-nourished. No distress.  HENT:  Head: Normocephalic and atraumatic.  Cardiovascular: Normal rate, regular rhythm and normal heart sounds.   Respiratory: Effort normal and breath sounds normal. No respiratory distress.  GI: Soft. Bowel sounds are normal. She exhibits no distension and no mass. There is no tenderness.  There is no rebound and no guarding.  Genitourinary: There is no rash or lesion on the right labia. There is no rash or lesion on the left labia. Uterus is not deviated, not enlarged, not fixed and not tender. Cervix exhibits no motion tenderness, no discharge and no friability. Right adnexum displays no mass, no tenderness and no fullness. Left adnexum displays no mass, no tenderness and no fullness. No erythema, tenderness or bleeding in the vagina. No vaginal discharge found.  Neurological: She is alert and oriented to person, place, and time.  Skin: Skin is warm and dry.  Psychiatric: She has a normal mood and affect.   Unable to doppler FHT MAU Course  Procedures Results for orders placed or performed during the hospital encounter of 11/18/15 (from the past 24 hour(s))  Urinalysis, Routine w reflex microscopic (not at [facility])     Status: Abnormal   Collection Time: 11/18/15 12:31 PM  Result Value Ref Range   Color, Urine YELLOW YELLOW   APPearance CLEAR CLEAR   Specific Gravity, Urine 1.020 1.005 - 1.030   pH 6.0 5.0 - 8.0   Glucose, UA NEGATIVE NEGATIVE mg/dL   Hgb urine dipstick TRACE (A) NEGATIVE   Bilirubin Urine NEGATIVE NEGATIVE   Ketones, ur NEGATIVE NEGATIVE mg/dL   Protein, ur NEGATIVE NEGATIVE mg/dL   Nitrite NEGATIVE NEGATIVE   Leukocytes, UA NEGATIVE NEGATIVE  Urine microscopic-add on     Status: Abnormal   Collection Time: 11/18/15 12:31 PM  Result Value  Ref Range   Squamous Epithelial / LPF 0-5 (A) NONE SEEN   WBC, UA 0-5 0 - 5 WBC/hpf   RBC / HPF 0-5 0 - 5 RBC/hpf   Bacteria, UA NONE SEEN NONE SEEN  Pregnancy, urine POC     Status: Abnormal   Collection Time: 11/18/15 12:36 PM  Result Value Ref Range   Preg Test, Ur POSITIVE (A) NEGATIVE  Wet prep, genital     Status: Abnormal   Collection Time: 11/18/15  1:33 PM  Result Value Ref Range   Yeast Wet Prep HPF POC NONE SEEN NONE SEEN   Trich, Wet Prep NONE SEEN NONE SEEN   Clue Cells Wet Prep HPF POC PRESENT (A)  NONE SEEN   WBC, Wet Prep HPF POC MANY (A) NONE SEEN   Sperm NONE SEEN   hCG, quantitative, pregnancy     Status: Abnormal   Collection Time: 11/18/15  1:40 PM  Result Value Ref Range   hCG, Beta Chain, Quant, S 582 (H) <5 mIU/mL  CBC     Status: None   Collection Time: 11/18/15  1:45 PM  Result Value Ref Range   WBC 8.9 4.0 - 10.5 K/uL   RBC 4.82 3.87 - 5.11 MIL/uL   Hemoglobin 13.6 12.0 - 15.0 g/dL   HCT 16.1 09.6 - 04.5 %   MCV 83.6 78.0 - 100.0 fL   MCH 28.2 26.0 - 34.0 pg   MCHC 33.7 30.0 - 36.0 g/dL   RDW 40.9 81.1 - 91.4 %   Platelets 299 150 - 400 K/uL  ABO/Rh     Status: None (Preliminary result)   Collection Time: 11/18/15  1:45 PM  Result Value Ref Range   ABO/RH(D) O POS    US Ob Comp Less 14 Wks  11/18/2015  CLINICAL DATA:  First trimester pregnancy with abdominal pain EXAM: OBSTETRIC <14 WK [place] AND TRANSVAGINAL OB US TECHNIQUE: Both transabdominal and transvaginal ultrasound examinations were performed for complete evaluation of the gestation as well as the maternal uterus, adnexal regions, and pelvic cul-de-sac. Transvaginal technique was performed to assess early pregnancy. COMPARISON:  None. FINDINGS: Intrauterine gestational sac: None none Yolk sac:  None Embryo:  None Cardiac Activity: None Maternal uterus/adnexae: Right ovary normal. Mildly complicated 2cm left ovarian cyst may be a corpus luteum or ruptured cyst. Endometrium is thick at 2.4cm. Small volume free fluid in pelvis. IMPRESSION: No IUP seen. Differential includes early pregnancy, missed spontaneous abortion, or ectopic pregnancy, of which there is no specific evidence. Recommend follow-up quantitative B-HCG levels and follow-up US in 14 days to reevaluate. This recommendation follows SRU consensus guidelines: Diagnostic Criteria for Nonviable Pregnancy Early in the First Trimester. Malva Limes Med 2013; 782:9562-13. Electronically Signed   By: Esperanza Heir M.D.   On: 11/18/2015 14:34   US Ob  Transvaginal  11/18/2015  CLINICAL DATA:  First trimester pregnancy with abdominal pain EXAM: OBSTETRIC <14 WK [place] AND TRANSVAGINAL OB US TECHNIQUE: Both transabdominal and transvaginal ultrasound examinations were performed for complete evaluation of the gestation as well as the maternal uterus, adnexal regions, and pelvic cul-de-sac. Transvaginal technique was performed to assess early pregnancy. COMPARISON:  None. FINDINGS: Intrauterine gestational sac: None none Yolk sac:  None Embryo:  None Cardiac Activity: None Maternal uterus/adnexae: Right ovary normal. Mildly complicated 2cm left ovarian cyst may be a corpus luteum or ruptured cyst. Endometrium is thick at 2.4cm. Small volume free fluid in pelvis. IMPRESSION: No IUP seen. Differential includes early pregnancy, missed spontaneous  abortion, or ectopic pregnancy, of which there is no specific evidence. Recommend follow-up quantitative B-HCG levels and follow-up US in 14 days to reevaluate. This recommendation follows SRU consensus guidelines: Diagnostic Criteria for Nonviable Pregnancy Early in the First Trimester. Malva Limes Med 2013; 960:4540-98. Electronically Signed   By: Esperanza Heir M.D.   On: 11/18/2015 14:34     Assessment and Plan   1. Bacterial vaginal infection   2. Abdominal pain in pregnancy, antepartum   Follow up in clinic in 48 hours for repeat quant, return w/ worsening pain or bleeding. Flagyl for BV.     Medication List    STOP taking these medications        metFORMIN 750 MG 24 hr tablet  Commonly known as:  GLUCOPHAGE XR     norethindrone-ethinyl estradiol-iron 1.5-30 MG-MCG tablet  Commonly known as:  MICROGESTIN FE,GILDESS FE,LOESTRIN FE      TAKE these medications        metroNIDAZOLE 500 MG tablet  Commonly known as:  FLAGYL  Take 1 tablet (500 mg total) by mouth 2 (two) times daily.     multivitamin-prenatal 27-0.8 MG Tabs tablet  Take 1 tablet by mouth daily at 12 noon.        Follow-up Information     Follow up with [facility] On 11/20/2015.   Specialty:  Obstetrics and Gynecology   Why:  between 1 and 3 PM   Contact information:   [address] [place] [address] [phone]        Toriann Spadoni 11/18/2015, 4:23 PM

## 2015-11-19 LAB — HIV ANTIBODY (ROUTINE TESTING W REFLEX): HIV Screen 4th Generation wRfx: NONREACTIVE

## 2015-11-20 ENCOUNTER — Ambulatory Visit (INDEPENDENT_AMBULATORY_CARE_PROVIDER_SITE_OTHER): Payer: Self-pay | Admitting: Advanced Practice Midwife

## 2015-11-20 DIAGNOSIS — R102 Pelvic and perineal pain: Secondary | ICD-10-CM

## 2015-11-20 DIAGNOSIS — O3680X0 Pregnancy with inconclusive fetal viability, not applicable or unspecified: Secondary | ICD-10-CM

## 2015-11-20 DIAGNOSIS — O26891 Other specified pregnancy related conditions, first trimester: Secondary | ICD-10-CM

## 2015-11-20 LAB — HCG, QUANTITATIVE, PREGNANCY: HCG, BETA CHAIN, QUANT, S: 1520 m[IU]/mL — AB (ref <5)

## 2015-11-20 LAB — GC/CHLAMYDIA PROBE AMP (~~LOC~~) NOT AT ARMC
CHLAMYDIA, DNA PROBE: NEGATIVE
Neisseria Gonorrhea: NEGATIVE

## 2015-11-20 MED FILL — metroNIDAZOLE 500 MG TABS: 500 | 7 days supply | Qty: 14 | Fill #0

## 2015-11-20 NOTE — Patient Instructions (Signed)

## 2015-11-20 NOTE — Progress Notes (Signed)
S: 24 y.o. G1P0000  by LMP presents to [facility] for repeat hcg.  She denies abdominal pain or vaginal bleeding today.  She was seen in MAU on 1/21 for pelvic/abdominal pain in early pregnancy.  She reports her pain has resolved without any treatment.   Her quant hcg on 1/21 was 582 and ultrasound showed no IUP or ectopic pregnancy.  HPI  O: LMP 08/31/2015  VS reviewed, nursing note reviewed,  Constitutional: well developed, well nourished, no distress HEENT: normocephalic CV: normal rate Pulm/chest wall: normal effort Abdomen: soft Neuro: alert and oriented x 3 Skin: warm, dry Psych: affect normal  Results for orders placed or performed in visit on 11/20/15 (from the past 24 hour(s))  hCG, quantitative, pregnancy     Status: Abnormal   Collection Time: 11/20/15  2:39 PM  Result Value Ref Range   hCG, Beta Chain, Quant, S 1520 (H) <5 mIU/mL    --/--/O POS (01/21 1345)  MDM: Ordered labs/reviewed results.  Quant hcg rose appropriately.  Discussed results with pt and S/O.  Pt to follow up with ultrasound in 1 week.  Ectopic precautions given and pt to return to MAU sooner if s/sx of ectopic, as ruptured ectopic can be life threatening.  Pt stable at time of discharge.  A: 1. Pregnancy of unknown anatomic location   2. Pelvic pain affecting pregnancy in first trimester, antepartum    3.  Appropriate rise in hcg in 48 hours  P: D/C home with ectopic/bleeding precautions F/U with outpatient ultrasound as ordered in 1 week Return to MAU as needed for emergencies  LEFTWICH-KIRBY, LISA, CNM 2:18 PM

## 2015-11-27 ENCOUNTER — Ambulatory Visit (HOSPITAL_COMMUNITY)
Admission: RE | Admit: 2015-11-27 | Discharge: 2015-11-27 | Disposition: A | Discharge status: Home / Self Care | Payer: Self-pay | Source: Ambulatory Visit | Attending: Advanced Practice Midwife | Admitting: Advanced Practice Midwife

## 2015-11-27 ENCOUNTER — Encounter: Payer: Self-pay | Admitting: Obstetrics & Gynecology

## 2015-11-27 ENCOUNTER — Ambulatory Visit (INDEPENDENT_AMBULATORY_CARE_PROVIDER_SITE_OTHER): Payer: Self-pay | Admitting: Obstetrics & Gynecology

## 2015-11-27 VITALS — BP 119/74 | HR 62 | Temp 98.0°F | Ht 62.0 in | Wt 199.6 lb

## 2015-11-27 DIAGNOSIS — Z3491 Encounter for supervision of normal pregnancy, unspecified, first trimester: Secondary | ICD-10-CM

## 2015-11-27 DIAGNOSIS — O3481 Maternal care for other abnormalities of pelvic organs, first trimester: Secondary | ICD-10-CM | POA: Insufficient documentation

## 2015-11-27 DIAGNOSIS — N8312 Corpus luteum cyst of left ovary: Secondary | ICD-10-CM | POA: Insufficient documentation

## 2015-11-27 DIAGNOSIS — Z36 Encounter for antenatal screening of mother: Secondary | ICD-10-CM | POA: Insufficient documentation

## 2015-11-27 DIAGNOSIS — R102 Pelvic and perineal pain: Secondary | ICD-10-CM

## 2015-11-27 DIAGNOSIS — O3680X0 Pregnancy with inconclusive fetal viability, not applicable or unspecified: Secondary | ICD-10-CM

## 2015-11-27 DIAGNOSIS — Z3A01 Less than 8 weeks gestation of pregnancy: Secondary | ICD-10-CM | POA: Insufficient documentation

## 2015-11-27 DIAGNOSIS — O26891 Other specified pregnancy related conditions, first trimester: Secondary | ICD-10-CM

## 2015-11-27 DIAGNOSIS — Z3201 Encounter for pregnancy test, result positive: Secondary | ICD-10-CM

## 2015-11-27 NOTE — Patient Instructions (Signed)
Primer trimestre de embarazo  (First Trimester of Pregnancy)  El primer trimestre de embarazo se extiende desde la semana 1 hasta el final de la semana 12 (mes 1 al mes 3). Una semana después de que un espermatozoide fecunda un óvulo, este se implantará en la pared uterina. Este embrión comenzará a desarrollarse hasta convertirse en un bebé. Sus genes y los de su pareja forman el bebé. Los genes del varón determinan si será un niño o una niña. Entre la semana 6 y la 8, se forman los ojos y el rostro, y los latidos del corazón pueden verse en la ecografía. Al final de las 12 semanas, todos los órganos del bebé están formados.   Ahora que está embarazada, querrá hacer todo lo que esté a su alcance para tener un bebé sano. Dos de las cosas más importantes son tener una buena atención prenatal y seguir las indicaciones del médico. La atención prenatal incluye toda la asistencia médica que usted recibe antes del nacimiento del bebé. Esta ayudará a prevenir, detectar y tratar cualquier problema durante el embarazo y el parto.  CAMBIOS EN EL ORGANISMO  Su organismo atraviesa por muchos cambios durante el embarazo, y estos varían de una mujer a otra.   · Al principio, puede aumentar o bajar algunos kilos.  · Puede tener malestar estomacal (náuseas) y vomitar. Si no puede controlar los vómitos, llame al médico.  · Puede cansarse con facilidad.  · Es posible que tenga dolores de cabeza que pueden aliviarse con los medicamentos que el médico le permita tomar.  · Puede orinar con mayor frecuencia. El dolor al orinar puede significar que usted tiene una infección de la vejiga.  · Debido al embarazo, puede tener acidez estomacal.  · Puede estar estreñida, ya que ciertas hormonas enlentecen los movimientos de los músculos que empujan los desechos a través de los intestinos.  · Pueden aparecer hemorroides o abultarse e hincharse las venas (venas varicosas).  · Las mamas pueden empezar a agrandarse y estar sensibles. Los pezones  pueden sobresalir más, y el tejido que los rodea (areola) tornarse más oscuro.  · Las encías pueden sangrar y estar sensibles al cepillado y al hilo dental.  · Pueden aparecer zonas oscuras o manchas (cloasma, máscara del embarazo) en el rostro que probablemente se atenuarán después del nacimiento del bebé.  · Los períodos menstruales se interrumpirán.  · Tal vez no tenga apetito.  · Puede sentir un fuerte deseo de consumir ciertos alimentos.  · Puede tener cambios a nivel emocional día a día, por ejemplo, por momentos puede estar emocionada por el embarazo y por otros preocuparse porque algo pueda salir mal con el embarazo o el bebé.  · Tendrá sueños más vívidos y extraños.  · Tal vez haya cambios en el cabello que pueden incluir su engrosamiento, crecimiento rápido y cambios en la textura. A algunas mujeres también se les cae el cabello durante o después del embarazo, o tienen el cabello seco o fino. Lo más probable es que el cabello se le normalice después del nacimiento del bebé.  QUÉ DEBE ESPERAR EN LAS CONSULTAS PRENATALES  Durante una visita prenatal de rutina:  · La pesarán para asegurarse de que usted y el bebé están creciendo normalmente.  · Le controlarán la presión arterial.  · Le medirán el abdomen para controlar el desarrollo del bebé.  · Se escucharán los latidos cardíacos a partir de la semana 10 o la 12 de embarazo, aproximadamente.  · Se analizarán los resultados de los estudios solicitados en visitas anteriores.  El   médico puede preguntarle:  · Cómo se siente.  · Si siente los movimientos del bebé.  · Si ha tenido síntomas anormales, como pérdida de líquido, sangrado, dolores de cabeza intensos o cólicos abdominales.  · Si está consumiendo algún producto que contenga tabaco, como cigarrillos, tabaco de mascar y cigarrillos electrónicos.  · Si tiene alguna pregunta.  Otros estudios que pueden realizarse durante el primer trimestre incluyen lo siguiente:  · Análisis de sangre para determinar el tipo  de sangre y detectar la presencia de infecciones previas. Además, se los usará para controlar si los niveles de hierro son bajos (anemia) y determinar los anticuerpos Rh. En una etapa más avanzada del embarazo, se harán análisis de sangre para saber si tiene diabetes, junto con otros estudios si surgen problemas.  · Análisis de orina para detectar infecciones, diabetes o proteínas en la orina.  · Una ecografía para confirmar que el bebé crece y se desarrolla correctamente.  · Una amniocentesis para diagnosticar posibles problemas genéticos.  · Estudios del feto para descartar espina bífida y síndrome de Down.  · Es posible que necesite otras pruebas adicionales.  · Prueba del VIH (virus de inmunodeficiencia humana). Los exámenes prenatales de rutina incluyen la prueba de detección del VIH, a menos que decida no realizársela.  INSTRUCCIONES PARA EL CUIDADO EN EL HOGAR   Medicamentos:  · Siga las indicaciones del médico en relación con el uso de medicamentos. Durante el embarazo, hay medicamentos que pueden tomarse y otros que no.  · Tome las vitaminas prenatales como se le indicó.  · Si está estreñida, tome un laxante suave, si el médico lo autoriza.  Dieta  · Consuma alimentos balanceados. Elija alimentos variados, como carne o proteínas de origen vegetal, pescado, leche y productos lácteos descremados, verduras, frutas y panes y cereales integrales. El médico la ayudará a determinar la cantidad de peso que puede aumentar.  · No coma carne cruda ni quesos sin cocinar. Estos elementos contienen bacterias que pueden causar defectos congénitos en el bebé.  · La ingesta diaria de cuatro o cinco comidas pequeñas en lugar de tres comidas abundantes puede ayudar a aliviar las náuseas y los vómitos. Si empieza a tener náuseas, comer algunas galletas saladas puede ser de ayuda. Beber líquidos entre las comidas en lugar de tomarlos durante las comidas también puede ayudar a calmar las náuseas y los vómitos.  · Si está  estreñida, consuma alimentos con alto contenido de fibra, como verduras y frutas frescas, y cereales integrales. Beba suficiente líquido para mantener la orina clara o de color amarillo pálido.  Actividad y ejercicios  · Haga ejercicio solamente como se lo haya indicado el médico. El ejercicio la ayudará a:    Controlar el peso.    Mantenerse en forma.    Estar preparada para el trabajo de parto y el parto.  · Los dolores, los cólicos en la parte baja del abdomen o los calambres en la cintura son un buen indicio de que debe dejar de hacer ejercicios. Consulte al médico antes de seguir haciendo ejercicios normales.  · Intente no estar de pie durante mucho tiempo. Mueva las piernas con frecuencia si debe estar de pie en un lugar durante mucho tiempo.  · Evite levantar pesos excesivos.  · Use zapatos de tacones bajos y mantenga una buena postura.  · Puede seguir teniendo relaciones sexuales, excepto que el médico le indique lo contrario.  Alivio del dolor o las molestias  · Use un sostén que le brinde buen   soporte si siente dolor a la palpación en las mamas.  · Dese baños de asiento con agua tibia para aliviar el dolor o las molestias causadas por las hemorroides. Use crema antihemorroidal si el médico se lo permite.  · Descanse con las piernas elevadas si tiene calambres o dolor de cintura.  · Si tiene venas varicosas en las piernas, use medias de descanso. Eleve los pies durante 15 minutos, 3 o 4 veces por día. Limite la cantidad de sal en su dieta.  Cuidados prenatales  · Programe las visitas prenatales para la semana 12 de embarazo. Generalmente se programan cada mes al principio y se hacen más frecuentes en los 2 últimos meses antes del parto.  · Escriba sus preguntas. Llévelas cuando concurra a las visitas prenatales.  · Concurra a todas las visitas prenatales como se lo haya indicado el médico.  Seguridad  · Colóquese el cinturón de seguridad cuando conduzca.  · Haga una lista de los números de teléfono de  emergencia, que incluya los números de teléfono de familiares, amigos, el hospital y los departamentos de policía y bomberos.  Consejos generales  · Pídale al médico que la derive a clases de educación prenatal en su localidad. Debe comenzar a tomar las clases antes de entrar en el mes 6 de embarazo.  · Pida ayuda si tiene necesidades nutricionales o de asesoramiento durante el embarazo. El médico puede aconsejarla o derivarla a especialistas para que la ayuden con diferentes necesidades.  · No se dé baños de inmersión en agua caliente, baños turcos ni saunas.  · No se haga duchas vaginales ni use tampones o toallas higiénicas perfumadas.  · No mantenga las piernas cruzadas durante mucho tiempo.  · Evite el contacto con las bandejas sanitarias de los gatos y la tierra que estos animales usan. Estos elementos contienen bacterias que pueden causar defectos congénitos al bebé y la posible pérdida del feto debido a un aborto espontáneo o muerte fetal.  · No fume, no consuma hierbas ni medicamentos que no hayan sido recetados por el médico. Las sustancias químicas que estos productos contienen afectan la formación y el desarrollo del bebé.  · No consuma ningún producto que contenga tabaco, lo que incluye cigarrillos, tabaco de mascar y cigarrillos electrónicos. Si necesita ayuda para dejar de fumar, consulte al médico. Puede recibir asesoramiento y otro tipo de recursos para dejar de fumar.  · Programe una cita con el dentista. En su casa, lávese los dientes con un cepillo dental blando y pásese el hilo dental con suavidad.  SOLICITE ATENCIÓN MÉDICA SI:   · Tiene mareos.  · Siente cólicos leves, presión en la pelvis o dolor persistente en el abdomen.  · Tiene náuseas, vómitos o diarrea persistentes.  · Tiene secreción vaginal con mal olor.  · Siente dolor al orinar.  · Tiene el rostro, las manos, las piernas o los tobillos más hinchados.  SOLICITE ATENCIÓN MÉDICA DE INMEDIATO SI:   · Tiene fiebre.  · Tiene una pérdida de  líquido por la vagina.  · Tiene sangrado o pequeñas pérdidas vaginales.  · Siente dolor intenso o cólicos en el abdomen.  · Sube o baja de peso rápidamente.  · Vomita sangre de color rojo brillante o material que parezca granos de café.  · Ha estado expuesta a la rubéola y no ha sufrido la enfermedad.  · Ha estado expuesta a la quinta enfermedad o a la varicela.  · Tiene un dolor de cabeza intenso.  · Le falta el aire.  · Sufre   cualquier tipo de traumatismo, por ejemplo, debido a una caída o un accidente automovilístico.     Esta información no tiene como fin reemplazar el consejo del médico. Asegúrese de hacerle al médico cualquier pregunta que tenga.     Document Released: 07/24/2005 Document Revised: 11/04/2014  [employer] Interactive Patient Education ©2016 [employer]

## 2015-11-27 NOTE — Progress Notes (Signed)
Spanish Interpreter [facility] Results Note  SUBJECTIVE HPI:  [name] is a 25 y.o. G1P0000 at [redacted]w[redacted]d by LMP who presents to the [facility] for followup ultrasound results. The patient denies abdominal pain or vaginal bleeding.  Upon review of the patient's records, patient was first seen in MAU on 1/21  BHCG on that day was 583.  Ultrasound showed no IUP.  Last seen in MAU on 1/23. BHCG was 1500.  Repeat ultrasound was performed earlier today.   Past Medical History  Diagnosis Date  . 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 Heart murmur   . Polycystic ovary syndrome 03/24/2013   Past Surgical History  Procedure Laterality Date  . Cardiac surgery     Social History   Social History  . Marital Status: Married    Spouse Name: N/A  . Number of Children: N/A  . Years of Education: N/A   Occupational History  . Not on file.   Social History Main Topics  . Smoking status: Passive Smoke Exposure - Never Smoker  . Smokeless tobacco: Never Used  . Alcohol Use: Yes  . Drug Use: No  . Sexual Activity: Yes    Birth Control/ Protection: None   Other Topics Concern  . Not on file   Social History Narrative   Current Outpatient Prescriptions on File Prior to Visit  Medication Sig Dispense Refill  . Prenatal Vit-Fe Fumarate-FA (MULTIVITAMIN-PRENATAL) 27-0.8 MG TABS tablet Take 1 tablet by mouth daily at 12 noon.     No current facility-administered medications on file prior to visit.   No Known Allergies  I have reviewed patient's Past Medical Hx, Surgical Hx, Family Hx, Social Hx, medications and allergies.   Review of Systems Review of Systems  Constitutional: Negative for fever and chills.  Gastrointestinal: Negative for nausea, vomiting, abdominal pain, diarrhea and constipation.  Genitourinary: Negative for dysuria.  Musculoskeletal: Negative for back pain.  Neurological: Negative for dizziness and weakness.     Physical Exam  BP 119/74 mmHg  Pulse 62  Temp(Src) 98 F (36.7 C) (Oral)  Ht  (1.575 m)  Wt 199 lb 9.6 oz (90.538 kg)  BMI 36.50 kg/m2  LMP 08/31/2015  GENERAL: Well-developed, well-nourished female in no acute distress.  HEENT: Normocephalic, atraumatic.   LUNGS: Effort normal ABDOMEN: soft, non-tender HEART: Regular rate  SKIN: Warm, dry and without erythema PSYCH: Normal mood and affect NEURO: Alert and oriented x 4  LAB RESULTS No results found for this or any previous visit (from the past 24 hour(s)).  IMAGING US Ob Comp Less 14 Wks  11/18/2015  CLINICAL DATA:  First trimester pregnancy with abdominal pain EXAM: OBSTETRIC <14 WK [place] AND TRANSVAGINAL OB US TECHNIQUE: Both transabdominal and transvaginal ultrasound examinations were performed for complete evaluation of the gestation as well as the maternal uterus, adnexal regions, and pelvic cul-de-sac. Transvaginal technique was performed to assess early pregnancy. COMPARISON:  None. FINDINGS: Intrauterine gestational sac: None none Yolk sac:  None Embryo:  None Cardiac Activity: None Maternal uterus/adnexae: Right ovary normal. Mildly complicated 2cm left ovarian cyst may be a corpus luteum or ruptured cyst. Endometrium is thick at 2.4cm. Small volume free fluid in pelvis. IMPRESSION: No IUP seen. Differential includes early pregnancy, missed spontaneous abortion, or ectopic pregnancy, of which there is no specific evidence. Recommend follow-up quantitative B-HCG levels and follow-up US in 14 days to reevaluate. This recommendation follows SRU consensus guidelines: Diagnostic  Criteria for Nonviable Pregnancy Early in the First Trimester. Malva Limes Med 2013; [id number]:7829-56. Electronically Signed   By: Esperanza Heir M.D.   On: 11/18/2015 14:34   US Ob Transvaginal  11/27/2015  CLINICAL DATA:  Follow-up pregnancy of unknown location EXAM: TRANSVAGINAL OB ULTRASOUND TECHNIQUE: Transvaginal ultrasound was performed for complete  evaluation of the gestation as well as the maternal uterus, adnexal regions, and pelvic cul-de-sac. COMPARISON:  11/18/2015 FINDINGS: Intrauterine gestational sac: Visualized/normal in shape. Yolk sac:  Not visualized Embryo:  Not visualized MSD: 10.1  mm   5 w   5  d Subchorionic hemorrhage:  Possible small subchronic hemorrhage. Maternal uterus/adnexae: Right ovary is not discretely visualized. Left ovary is notable for a 2.4 cm simple corpus luteal cyst. No free fluid. IMPRESSION: Single intrauterine gestational sac, measuring 5 weeks 5 days by mean sac diameter. No yolk sac or fetal pole is visualized. Consider follow-up pelvic ultrasound in 14 days to confirm viability as clinically warranted. Electronically Signed   By: Charline Bills M.D.   On: 11/27/2015 12:11   US Ob Transvaginal  11/18/2015  CLINICAL DATA:  First trimester pregnancy with abdominal pain EXAM: OBSTETRIC <14 WK [place] AND TRANSVAGINAL OB US TECHNIQUE: Both transabdominal and transvaginal ultrasound examinations were performed for complete evaluation of the gestation as well as the maternal uterus, adnexal regions, and pelvic cul-de-sac. Transvaginal technique was performed to assess early pregnancy. COMPARISON:  None. FINDINGS: Intrauterine gestational sac: None none Yolk sac:  None Embryo:  None Cardiac Activity: None Maternal uterus/adnexae: Right ovary normal. Mildly complicated 2cm left ovarian cyst may be a corpus luteum or ruptured cyst. Endometrium is thick at 2.4cm. Small volume free fluid in pelvis. IMPRESSION: No IUP seen. Differential includes early pregnancy, missed spontaneous abortion, or ectopic pregnancy, of which there is no specific evidence. Recommend follow-up quantitative B-HCG levels and follow-up US in 14 days to reevaluate. This recommendation follows SRU consensus guidelines: Diagnostic Criteria for Nonviable Pregnancy Early in the First Trimester. Malva Limes Med 2013; [id number]. Electronically Signed   By: Esperanza Heir M.D.   On: 11/18/2015 14:34    ASSESSMENT No diagnosis found.  PLAN Discharge home in stable condition Patient advised to continue taking prenatal vitamins F/U [facility] as planned Pregnancy confirmation letter given Patient advised to start prenatal care with [facility] provider of choice as soon as possible  Adam Phenix, MD  11/27/2015  1:11 PM

## 2015-11-29 ENCOUNTER — Other Ambulatory Visit (INDEPENDENT_AMBULATORY_CARE_PROVIDER_SITE_OTHER): Payer: Self-pay

## 2015-11-29 DIAGNOSIS — Z3481 Encounter for supervision of other normal pregnancy, first trimester: Secondary | ICD-10-CM

## 2015-11-29 NOTE — Progress Notes (Signed)
New ob labs done today [name] 

## 2015-11-30 LAB — OBSTETRIC PANEL
Antibody Screen: NEGATIVE
BASOS ABS: 0 10*3/uL (ref 0.0–0.1)
Basophils Relative: 0 % (ref 0–1)
Eosinophils Absolute: 0.1 10*3/uL (ref 0.0–0.7)
Eosinophils Relative: 1 % (ref 0–5)
HCT: 39.4 % (ref 36.0–46.0)
HEP B S AG: NEGATIVE
Hemoglobin: 13.3 g/dL (ref 12.0–15.0)
LYMPHS ABS: 2.8 10*3/uL (ref 0.7–4.0)
LYMPHS PCT: 37 % (ref 12–46)
MCH: 28 pg (ref 26.0–34.0)
MCHC: 33.8 g/dL (ref 30.0–36.0)
MCV: 82.9 fL (ref 78.0–100.0)
MONO ABS: 0.6 10*3/uL (ref 0.1–1.0)
MPV: 10.1 fL (ref 8.6–12.4)
Monocytes Relative: 8 % (ref 3–12)
NEUTROS ABS: 4.2 10*3/uL (ref 1.7–7.7)
Neutrophils Relative %: 54 % (ref 43–77)
PLATELETS: 273 10*3/uL (ref 150–400)
RBC: 4.75 MIL/uL (ref 3.87–5.11)
RDW: 14.2 % (ref 11.5–15.5)
RH TYPE: POSITIVE
RUBELLA: 5.43 {index} — AB (ref <0.90)
WBC: 7.7 10*3/uL (ref 4.0–10.5)

## 2015-11-30 LAB — SICKLE CELL SCREEN: Sickle Cell Screen: NEGATIVE

## 2015-11-30 LAB — HIV ANTIBODY (ROUTINE TESTING W REFLEX): HIV 1&2 Ab, 4th Generation: NONREACTIVE

## 2015-12-01 ENCOUNTER — Ambulatory Visit: Payer: Self-pay | Attending: Internal Medicine

## 2015-12-01 LAB — CULTURE, OB URINE: Colony Count: 2000

## 2015-12-06 ENCOUNTER — Encounter: Payer: Self-pay | Admitting: Internal Medicine

## 2015-12-06 ENCOUNTER — Ambulatory Visit (INDEPENDENT_AMBULATORY_CARE_PROVIDER_SITE_OTHER): Payer: Self-pay | Admitting: Internal Medicine

## 2015-12-06 ENCOUNTER — Other Ambulatory Visit (HOSPITAL_COMMUNITY)
Admission: RE | Admit: 2015-12-06 | Discharge: 2015-12-06 | Disposition: A | Discharge status: Home / Self Care | Payer: Self-pay | Source: Ambulatory Visit | Attending: Family Medicine | Admitting: Family Medicine

## 2015-12-06 VITALS — BP 123/70 | HR 61 | Temp 98.4°F | Wt 199.5 lb

## 2015-12-06 DIAGNOSIS — Z3401 Encounter for supervision of normal first pregnancy, first trimester: Secondary | ICD-10-CM

## 2015-12-06 DIAGNOSIS — Z124 Encounter for screening for malignant neoplasm of cervix: Secondary | ICD-10-CM

## 2015-12-06 DIAGNOSIS — Z23 Encounter for immunization: Secondary | ICD-10-CM

## 2015-12-06 DIAGNOSIS — Z01419 Encounter for gynecological examination (general) (routine) without abnormal findings: Secondary | ICD-10-CM | POA: Insufficient documentation

## 2015-12-06 NOTE — Patient Instructions (Addendum)
Keep taking your prenatal vitamin. I will let you know the results of your PAP smear today. Please get the ultrasound at [facility] hospital on 2/16. Make an appointment for your next visit in 4 weeks.      Safe Medications in Pregnancy   Acne: Benzoyl Peroxide Salicylic Acid  Backache/Headache: Tylenol: 2 regular strength every 4 hours OR              2 Extra strength every 6 hours  Colds/Coughs/Allergies: Benadryl (alcohol free) 25 mg every 6 hours as needed Breath right strips Claritin Cepacol throat lozenges Chloraseptic throat spray Cold-Eeze- up to three times per day Cough drops, alcohol free Flonase (by prescription only) Guaifenesin Mucinex Robitussin DM (plain only, alcohol free) Saline nasal spray/drops Sudafed (pseudoephedrine) & Actifed ** use only after [redacted] weeks gestation and if you do not have high blood pressure Tylenol Vicks Vaporub Zinc lozenges Zyrtec   Constipation: Colace Ducolax suppositories Fleet enema Glycerin suppositories Metamucil Milk of magnesia Miralax Senokot Smooth move tea  Diarrhea: Kaopectate Imodium A-D  *NO pepto Bismol  Hemorrhoids: Anusol Anusol HC Preparation H Tucks  Indigestion: Tums Maalox Mylanta Zantac  Pepcid  Insomnia: Benadryl (alcohol free)  every 6 hours as needed Tylenol PM Unisom, no Gelcaps  Leg Cramps: Tums MagGel  Nausea/Vomiting:  Bonine Dramamine Emetrol Ginger extract Sea bands Meclizine  Nausea medication to take during pregnancy:  Unisom (doxylamine succinate 25 mg tablets) Take one tablet daily at bedtime. If symptoms are not adequately controlled, the dose can be increased to a maximum recommended dose of two tablets daily (1/2 tablet in the morning, 1/2 tablet mid-afternoon and one at bedtime). Vitamin B6  tablets. Take one tablet twice a day (up to 200 mg per day).  Skin Rashes: Aveeno products Benadryl cream or  every 6 hours as needed Calamine Lotion 1%  cortisone cream  Yeast infection: Gyne-lotrimin 7 Monistat 7   **If taking multiple medications, please check labels to avoid duplicating the same active ingredients **take medication as directed on the label ** Do not exceed 4000 mg of tylenol in 24 hours **Do not take medications that contain aspirin or ibuprofen

## 2015-12-06 NOTE — Progress Notes (Signed)
Kimberly Reid is a 25 y.o. yo G1P0000 at [redacted]w[redacted]d who presents for her initial prenatal visit. Pregnancy is planned She reports nausea. Nausea is very mild without emesis. Still able to eat and take vitamins. She  is taking PNV. See flow sheet for details.  PMH, POBH, FH, meds, allergies and Social Hx reviewed.  Prenatal Exam: Gen: Well nourished, well developed.  No distress.  Vitals noted. HEENT: Normocephalic, atraumatic.  Neck supple without cervical lymphadenopathy, thyromegaly or thyroid nodules.  Fair dentition. CV: RRR no murmur, gallops or rubs Lungs: CTAB.  Normal respiratory effort without wheezes or rales. Abd: soft, NTND. +BS.  Uterus not appreciated above pelvis. GU: Normal external female genitalia without lesions.  Normal vaginal, well rugated without lesions. No vaginal discharge.  Bimanual exam: No adnexal mass or TTP. No CMT.  Uterus size consistent with dating.  Ext: No clubbing, cyanosis or edema. Psych: Normal grooming and dress.  Not depressed or anxious appearing.  Normal thought content and process without flight of ideas or looseness of associations.  Assessment & Plan: 1) 25 y.o. yo G1P0000 at [redacted]w[redacted]d via early ultrasound doing well.  Patient had two ultrasounds in early pregnancy where were unable to visualize IUP. Third U/S gave dating of [redacted]w[redacted]d. Recommended repeat US in 14 days. This has been ordered for 12/14/15 to confirm viability of IUP.  Current pregnancy issues include diagnosed with BV at the MAU. Has not begun course of Flagyl yet today. Is having vaginal discharge so recommended to complete course.  Dating is reliable. Prenatal labs reviewed; unremarkable. Will discuss genetic screening at next visit Early glucola is indicated given BMI and Hispanic ethnicity.  PAP smear performed today for routine cervical cancer screening.  PHQ-9 and Pregnancy Medical Home forms completed and reviewed. Flu vaccine administered.   Bleeding and pain precautions  reviewed. Importance of prenatal vitamins reviewed.  Follow up in 4 weeks.

## 2015-12-08 LAB — CYTOLOGY - PAP

## 2015-12-12 ENCOUNTER — Encounter: Payer: Self-pay | Admitting: *Deleted

## 2015-12-12 ENCOUNTER — Other Ambulatory Visit: Payer: Self-pay | Admitting: Internal Medicine

## 2015-12-12 DIAGNOSIS — O3680X0 Pregnancy with inconclusive fetal viability, not applicable or unspecified: Secondary | ICD-10-CM

## 2015-12-12 NOTE — Progress Notes (Signed)
Letter mailed to patient. Rochella Benner,CMA  

## 2015-12-14 ENCOUNTER — Ambulatory Visit (HOSPITAL_COMMUNITY)
Admission: RE | Admit: 2015-12-14 | Discharge: 2015-12-14 | Disposition: A | Discharge status: Home / Self Care | Payer: Self-pay | Source: Ambulatory Visit | Attending: Family Medicine | Admitting: Family Medicine

## 2015-12-14 DIAGNOSIS — O209 Hemorrhage in early pregnancy, unspecified: Secondary | ICD-10-CM | POA: Insufficient documentation

## 2015-12-14 DIAGNOSIS — Z3A15 15 weeks gestation of pregnancy: Secondary | ICD-10-CM | POA: Insufficient documentation

## 2015-12-14 DIAGNOSIS — O3680X0 Pregnancy with inconclusive fetal viability, not applicable or unspecified: Secondary | ICD-10-CM

## 2015-12-14 DIAGNOSIS — Z36 Encounter for antenatal screening of mother: Secondary | ICD-10-CM | POA: Insufficient documentation

## 2015-12-18 ENCOUNTER — Encounter: Payer: Self-pay | Admitting: Student

## 2015-12-28 ENCOUNTER — Ambulatory Visit (INDEPENDENT_AMBULATORY_CARE_PROVIDER_SITE_OTHER): Payer: Self-pay | Admitting: Internal Medicine

## 2015-12-28 VITALS — BP 131/68 | HR 75 | Temp 98.5°F | Wt 198.0 lb

## 2015-12-28 DIAGNOSIS — Z331 Pregnant state, incidental: Secondary | ICD-10-CM

## 2015-12-28 DIAGNOSIS — O0001 Abdominal pregnancy with intrauterine pregnancy: Secondary | ICD-10-CM

## 2015-12-28 DIAGNOSIS — Z3491 Encounter for supervision of normal pregnancy, unspecified, first trimester: Secondary | ICD-10-CM

## 2015-12-28 LAB — GLUCOSE, CAPILLARY: GLUCOSE-CAPILLARY: 98 mg/dL (ref 65–99)

## 2015-12-28 NOTE — Patient Instructions (Signed)

## 2015-12-28 NOTE — Progress Notes (Signed)
Kimberly Reid is a 25 y.o. G1P0000 at [redacted]w[redacted]d here for routine follow up.  She reports no complaints. See flow sheet for details.  A/P: Pregnancy at [redacted]w[redacted]d.  Doing well.   Pregnancy issues include None . Patient is interested in genetic screening. Will have AFP QUAD screen later in pregnancy.  1 hr early GTT test performed today. Will f/u results.  Bleeding and pain precautions reviewed. Taking PNV every night.  Follow up 4 weeks.

## 2016-01-22 ENCOUNTER — Ambulatory Visit (INDEPENDENT_AMBULATORY_CARE_PROVIDER_SITE_OTHER): Payer: Self-pay | Admitting: Internal Medicine

## 2016-01-22 VITALS — BP 113/67 | HR 79 | Temp 98.3°F | Wt 198.2 lb

## 2016-01-22 DIAGNOSIS — Z3490 Encounter for supervision of normal pregnancy, unspecified, unspecified trimester: Secondary | ICD-10-CM

## 2016-01-22 DIAGNOSIS — Z331 Pregnant state, incidental: Secondary | ICD-10-CM

## 2016-01-22 NOTE — Patient Instructions (Signed)
Return in 4 weeks for your next prenatal visit. Call the clinic if you have any questions/concerns about your pregnancy.   Take Care,   Dr. Earlene PlaterWallace

## 2016-01-22 NOTE — Progress Notes (Signed)
Kimberly SpikesKaren Balderas Reid is a 25 y.o. G1P0000 at 3811w5d here for routine follow up.  She reports nausea and LE being cold. . See flow sheet for details. Was unable to assess fetal heart tones with doppler. Bedside US was used. Fetal heartbeat was visualized and counted.   1. Nausea: Reports feeling nauseous most days. Has only had one episode of emesis this entire pregnancy. Able to take PNV. Eats specific meals and does not snack throughout day.   2. Lower Extremity Coldness: Reports toes/feet becoming cold usually when she wakes up in the morning. Sometimes notes color changes to purple in her toes. Reports sleeping on her side with legs brought up to her chest. Does not wear socks to bed. No leg cramping or pain associated with this.   A/P: Pregnancy at 6911w5d.  Doing well.   Pregnancy issues include nausea and pedal coldness. Nausea: Discussed light snacking throughout the day on crackers or bread and taking small sips of water throughout the day. Counseled to avoid large meals all at once. Discussed option of Vit B6 and Unasyn but patient declined. Understands she needs to return if starts having increase in emesis or loss of appetite.  Pedal Coldness: Sounds consistent with Raynaud's and could be worsened by sleeping position. No further work up needed at present as patient has palpable pedal pulses. Instructed her to wear socks at night and to try to keep her legs stretched out. Will continue to follow.  Anatomy ultrasound will be ordered at next visit to be scheduled at 18-19 weeks. Patient is interested in genetic screening. Quad screen at next visit.  Bleeding and pain precautions reviewed. Follow up 4 weeks.

## 2016-02-20 ENCOUNTER — Ambulatory Visit (INDEPENDENT_AMBULATORY_CARE_PROVIDER_SITE_OTHER): Payer: Self-pay | Admitting: Family Medicine

## 2016-02-20 ENCOUNTER — Encounter: Payer: Self-pay | Admitting: Family Medicine

## 2016-02-20 VITALS — BP 124/71 | HR 84 | Temp 98.3°F | Wt 205.7 lb

## 2016-02-20 DIAGNOSIS — J069 Acute upper respiratory infection, unspecified: Secondary | ICD-10-CM | POA: Insufficient documentation

## 2016-02-20 DIAGNOSIS — O0990 Supervision of high risk pregnancy, unspecified, unspecified trimester: Secondary | ICD-10-CM | POA: Insufficient documentation

## 2016-02-20 NOTE — Patient Instructions (Signed)
Las medicinas seguras para tomar durante el embarazo  Safe Medications in Pregnancy  Acn:  Benzoyl Peroxide (Perxido de benzolo)  Salicylic Acid (cido saliclico)  Dolor de espalda/Dolor de cabeza:  Tylenol: 2 pastillas de concentracin regular cada 4 horas O 2 pastillas de concentracin fuerte cada 6 horas  Resfriados/Tos/Alergias:  Benadryl (sin alcohol) 25 mg cada 6 horas segn lo necesite Breath Right strips (Tiras para respirar correctamente)  Claritin  Cepacol (pastillas de chupar para la garganta)  Chloraseptic (aerosol para la garganta)  Cold-Eeze- hasta tres veces por da  Cough drops (pastillas de chupar para la tos, sin alcohol)  Flonase  Guaifenesin  Mucinex  Robitussin DM (simple solamente, sin alcohol)  Saline nasal spray/drops (Aerosol nasal salino/gotas) Sudafed (pseudoephedrine) y  Actifed * utilizar slo despus de 12 semanas de gestacin y si no tiene la presin arterial alta.  Tylenol Vicks  VapoRub  Zinc lozenges (pastillas para la garganta)  Zyrtec  Estreimiento:  Colace  Ducolax (supositorios)  Fleet enema (lavado intestinal rectal)  Glycerin (supositorios)  Metamucil  Milk of magnesia (leche de magnesia)  Miralax  Senokot  Smooth Move (t)  Diarrea:  Kaopectate Imodium A-D  *NO tome Pepto-Bismol  Hemorroides:  Anusol  Anusol HC  Preparation H  Tucks  Indigestin:  Tums  Maalox  Mylanta  Zantac  Pepcid  Insomnia:  Benadryl (sin alcohol) 25mg cada 6 horas segn lo necesite  Tylenol PM  Unisom, no Gelcaps  Calambres en las piernas:  Tums  MagGel Nuseas/Vmitos:  Bonine  Dramamine  Emetrol  Ginger (extracto)  Sea-Bands  Meclizine  Medicina para las nuseas que puede tomar durante el embarazo: Unisom (doxylamine succinate, pastillas de 25 mg) Tome una pastilla al da al acostarse. Si los sntomas no estn adecuadamente controlados, la dosis puede aumentarse hasta una dosis mxima recomendada de dos pastillas al da (1/2  pastilla por la maana, 1/2 pastilla a media tarde y una pastilla al acostarse). Pastillas de Vitamina B6 de 100mg. Tome una pastilla dos veces al da (hasta 200 mg por da).  Erupciones en la piel:  Productos de Aveeno  Benadryl cream (crema o una dosis de 25mg cada 6 horas segn lo necesite)  Calamine Lotion (locin)  1% cortisone cream (crema de cortisona de 1%)  nfeccin vaginal por hongos (candidiasis):  Gyne-lotrimin 7  Monistat 7   **Si est tomando varias medicinas, por favor revise las etiquetas para evitar duplicar los mismos ingredientes activos. **Tome la medicina segn lo indicado en la etiqueta. **No tome ms de 400 mg de Tylenol en 24 horas. **No tome medicinas que contengan aspirina o ibuprofeno.     

## 2016-02-22 ENCOUNTER — Ambulatory Visit (INDEPENDENT_AMBULATORY_CARE_PROVIDER_SITE_OTHER): Payer: Self-pay | Admitting: Internal Medicine

## 2016-02-22 ENCOUNTER — Encounter: Payer: Self-pay | Admitting: Internal Medicine

## 2016-02-22 VITALS — BP 110/70 | HR 99 | Temp 98.2°F | Wt 202.0 lb

## 2016-02-22 DIAGNOSIS — Z3490 Encounter for supervision of normal pregnancy, unspecified, unspecified trimester: Secondary | ICD-10-CM

## 2016-02-22 DIAGNOSIS — Z331 Pregnant state, incidental: Secondary | ICD-10-CM

## 2016-02-22 NOTE — Assessment & Plan Note (Addendum)
Cough, congestion, sore throat x2 days in pregnant female, lungs clear - symptomatic management, gave list of safe otc meds in pregnancy - rec rest and fluids

## 2016-02-22 NOTE — Progress Notes (Signed)
   Subjective:   Kimberly Reid is a 25 y.o. female with a history of currently pregnant here for cold  URI  Has been sick for 2 days. Nasal discharge: bilateral, clear to yellow Medications tried: none Sick contacts: husband recently had a cold  Symptoms Fever: no Headache or face pain: no Tooth pain: no Sneezing: no Scratchy throat: yes Allergies: no Muscle aches: no Severe fatigue: no Stiff neck: no Shortness of breath: no Rash: no Sore throat or swollen glands: yes   ROS see HPI Smoking Status noted    Objective:  BP 124/71 mmHg  Pulse 84  Temp(Src) 98.3 F (36.8 C) (Oral)  Wt 205 lb 11.2 oz (93.305 kg)  LMP 08/31/2015  Gen:  25 y.o. female in NAD HEENT: NCAT, MMM, anicteric sclerae, rhinorrhea, serous effusion behind BTM CV: RRR, no MRG Resp: Non-labored, CTAB, no wheezes noted Abd: Soft, NTND Ext: WWP, no edema Neuro: Alert and oriented, speech normal    Assessment & Plan:     Kimberly Reid is a 25 y.o. female here for congestion/cough  URI (upper respiratory infection) Cough, congestion, sore throat x2 days in pregnant female, lungs clear - symptomatic management, gave list of safe otc meds in pregnancy - rec rest and fluids       Beverely LowElena Adamo, MD, MPH [facility] PGY-3 02/22/2016 2:28 PM

## 2016-02-22 NOTE — Patient Instructions (Signed)
For your cold, you can try Mucinex and/or Claritin. Also Afrin (nasal spray) for 3 days.   Return in 4 weeks.

## 2016-02-22 NOTE — Progress Notes (Signed)
[name] is a 25 y.o. G1P0000 at 6275w1d here for routine follow up.  She reports cough/cold symptoms. See flow sheet for details.  Cough: Present for 4-5 days. Reports cough and nasal congestion. No SOB or fevers at home. Tried Sudafed but it made her feel like her heart was racing and nauseous so she discontinued the medicine. Has been using a Zinc nasal spray with some relief.   O:  Gen: NAD but appears ill  ENT: TM normal bilaterally. Nasal turbinate swelling bilaterally. Postnasal drip present in oropharynx. No exudates.  Pulm: CTAB. Normal WOB. Coughing.    A/P: Pregnancy at 4875w1d.  Doing well.   Pregnancy issues include None. Anatomy ultrasound ordered to be scheduled at 18-19 weeks. Patient is interested in genetic screening. AFP Quad today.  Suspect cough is viral URI. Instructed patient to try OTC supportive care. Safe med options for pregnancy given. Return precautions given.  Bleeding and pain precautions reviewed. Follow up 4 weeks.

## 2016-02-26 ENCOUNTER — Ambulatory Visit (HOSPITAL_COMMUNITY)
Admission: RE | Admit: 2016-02-26 | Discharge: 2016-02-26 | Disposition: A | Discharge status: Home / Self Care | Payer: Self-pay | Source: Ambulatory Visit | Attending: Family Medicine | Admitting: Family Medicine

## 2016-02-26 DIAGNOSIS — Z3A19 19 weeks gestation of pregnancy: Secondary | ICD-10-CM | POA: Insufficient documentation

## 2016-02-26 DIAGNOSIS — Z3490 Encounter for supervision of normal pregnancy, unspecified, unspecified trimester: Secondary | ICD-10-CM

## 2016-02-26 DIAGNOSIS — Z36 Encounter for antenatal screening of mother: Secondary | ICD-10-CM | POA: Insufficient documentation

## 2016-03-20 ENCOUNTER — Ambulatory Visit (INDEPENDENT_AMBULATORY_CARE_PROVIDER_SITE_OTHER): Payer: Self-pay | Admitting: Internal Medicine

## 2016-03-20 ENCOUNTER — Encounter: Payer: Self-pay | Admitting: Internal Medicine

## 2016-03-20 VITALS — BP 124/68 | HR 79 | Temp 98.4°F | Wt 211.8 lb

## 2016-03-20 DIAGNOSIS — Z3490 Encounter for supervision of normal pregnancy, unspecified, unspecified trimester: Secondary | ICD-10-CM

## 2016-03-20 DIAGNOSIS — Z331 Pregnant state, incidental: Secondary | ICD-10-CM

## 2016-03-20 NOTE — Progress Notes (Signed)
[name] is a 25 y.o. G1P0000 at 3167w0d here for routine follow up.  She reports no concerns.  See flow sheet for details.  A/P: Pregnancy at 4067w0d.  Doing well.   Pregnancy issues include obesity.  Anatomy scan reviewed, problems are not noted. Some anatomy difficult to visualize 2/2 to maternal habitus and fetal positioning. Repeat US not required. Patient does not desire to have another anatomy U/S.  Preterm labor precautions reviewed. Follow up 4 weeks.

## 2016-03-20 NOTE — Patient Instructions (Signed)
Make your next appointment for 4 weeks from now. Congrats on the baby boy!

## 2016-04-02 ENCOUNTER — Telehealth: Payer: Self-pay | Admitting: Internal Medicine

## 2016-04-02 NOTE — Telephone Encounter (Signed)
Patient asks doctor Kimberly Reid if she should fast for next appointment. Please, follow up (Spanish).

## 2016-04-02 NOTE — Telephone Encounter (Signed)
Please call patient back and let her know that she does not need to fast before her next appointment. Thanks!

## 2016-04-03 NOTE — Telephone Encounter (Signed)
Patient informed via pacific interpreters. [phone]). Sunday SpillersSharon T Sabin Reid, CMA

## 2016-04-18 ENCOUNTER — Ambulatory Visit (INDEPENDENT_AMBULATORY_CARE_PROVIDER_SITE_OTHER): Payer: Self-pay | Admitting: Family Medicine

## 2016-04-18 VITALS — BP 137/81 | HR 98 | Temp 98.3°F | Wt 214.0 lb

## 2016-04-18 DIAGNOSIS — Z3402 Encounter for supervision of normal first pregnancy, second trimester: Secondary | ICD-10-CM

## 2016-04-18 LAB — GLUCOSE, CAPILLARY: Glucose-Capillary: 136 mg/dL — ABNORMAL HIGH (ref 65–99)

## 2016-04-18 LAB — CBC
HCT: 35.7 % (ref 35.0–45.0)
HEMOGLOBIN: 11.8 g/dL (ref 11.7–15.5)
MCH: 28.2 pg (ref 27.0–33.0)
MCHC: 33.1 g/dL (ref 32.0–36.0)
MCV: 85.4 fL (ref 80.0–100.0)
MPV: 9.8 fL (ref 7.5–12.5)
PLATELETS: 249 10*3/uL (ref 140–400)
RBC: 4.18 MIL/uL (ref 3.80–5.10)
RDW: 13.6 % (ref 11.0–15.0)
WBC: 9.3 10*3/uL (ref 3.8–10.8)

## 2016-04-18 NOTE — Progress Notes (Signed)
Kimberly SpikesKaren Balderas Reid is a 25 y.o. G1P0 at 7679w1d for routine follow up.  She reports to her hands going numb at night. Her right hand does this more than her left. Interview conducted with a video Spanish interpretor.   See flow sheet for details.  A/P: Pregnancy at 8079w1d.  Doing well.   Pregnancy issues include none.  Childbirth and education classes were offered. Has breastfeeding class scheduled for July.  Preterm labor precautions reviewed. [facility]   Most likely she is having some carpal tunnel syndrome with her hand numbness.  Advised she can use a cock up splint.   1 hr GTT today and lab work

## 2016-04-18 NOTE — Addendum Note (Signed)
Addended by: Jennette BillBUSICK, Mickala Laton L on: 04/18/2016 03:17 PM   Modules accepted: Orders

## 2016-04-18 NOTE — Patient Instructions (Signed)
Thank you for coming in,   Voy a llamar o enviar una carta con los [facility].  Por favor haga su prxima cita en 4 semanas en la clnica de OB.  Please bring all of your medications with you to each visit.   Health maintenance items that are due.  Health Maintenance  Topic Date Due  . Tetanus Vaccine  03/07/2009  . Flu Shot  05/28/2016  . Pap Smear  12/05/2018  . HIV Screening  Completed     Sign up for My Chart to have easy access to your labs results, and communication with your Primary care physician   Please feel free to call with any questions or concerns at any time, at [phone]. --Dr. Jordan LikesSchmitz

## 2016-04-19 LAB — RPR

## 2016-04-19 LAB — HIV ANTIBODY (ROUTINE TESTING W REFLEX): HIV 1&2 Ab, 4th Generation: NONREACTIVE

## 2016-04-24 ENCOUNTER — Other Ambulatory Visit (INDEPENDENT_AMBULATORY_CARE_PROVIDER_SITE_OTHER): Payer: Self-pay

## 2016-04-24 DIAGNOSIS — Z3402 Encounter for supervision of normal first pregnancy, second trimester: Secondary | ICD-10-CM

## 2016-04-24 LAB — GLUCOSE TOLERANCE, 3 HOURS
GLUCOSE 3 HOUR GTT: 144 mg/dL (ref <145)
GLUCOSE, 1 HOUR-GESTATIONAL: 119 mg/dL (ref <190)
GLUCOSE, FASTING-GESTATIONAL: 86 mg/dL (ref 65–104)
Glucose Tolerance, 2 hour: 156 mg/dL (ref <165)

## 2016-04-24 LAB — GLUCOSE, CAPILLARY: Glucose-Capillary: 93 mg/dL (ref 65–99)

## 2016-04-25 ENCOUNTER — Other Ambulatory Visit: Payer: Self-pay | Admitting: Family Medicine

## 2016-04-25 DIAGNOSIS — O24419 Gestational diabetes mellitus in pregnancy, unspecified control: Secondary | ICD-10-CM

## 2016-04-25 NOTE — Progress Notes (Addendum)
Patient ID: Kimberly Reid, female   DOB: 03-09-1991, 25 y.o.   MRN: [id number] Note adjustment.  Initially used the National diabetes data for her 3 hour glucola. After reviewing with lab we actually use the O sullivan date. Hence her 3 hour glucola is positive and [facility]. I will get this across to Dr. Earlene PlaterWallace and place [facility].

## 2016-05-09 ENCOUNTER — Ambulatory Visit (INDEPENDENT_AMBULATORY_CARE_PROVIDER_SITE_OTHER): Payer: Self-pay | Admitting: Family Medicine

## 2016-05-09 VITALS — BP 132/61 | HR 82 | Temp 98.1°F | Wt 213.0 lb

## 2016-05-09 DIAGNOSIS — Z3492 Encounter for supervision of normal pregnancy, unspecified, second trimester: Secondary | ICD-10-CM

## 2016-05-09 NOTE — Patient Instructions (Signed)
Tercer trimestre de Psychiatrist (Third Trimester of Pregnancy) El tercer trimestre comprende desde la semana29 hasta la semana42, es decir, desde el mes7 hasta el [address] En este trimestre, el feto crece muy rpido. Hacia el final del noveno mes, el feto mide alrededor de 20pulgadas (45cm) de largo y pesa entre 6y 10libras [phone]).  CUIDADOS EN EL HOGAR   No fume, no consuma hierbas ni beba alcohol. No tome frmacos que el mdico no haya autorizado.  No consuma ningn producto que contenga tabaco, lo que incluye cigarrillos, tabaco de Theatre manager o Administrator, Civil Service. Si necesita ayuda para dejar de fumar, consulte al [employer]. Puede recibir asesoramiento u otro tipo de apoyo para dejar de fumar.  Tome los medicamentos solamente como se lo haya indicado el mdico. Algunos medicamentos son seguros para tomar durante el Psychiatrist y otros no lo son.  Haga ejercicios solamente como se lo haya indicado el mdico. Interrumpa la actividad fsica si comienza a tener calambres.  Ingiera alimentos saludables de [place] regular.  Use un sostn que le brinde buen soporte si sus mamas estn sensibles.  No se d baos de inmersin en agua caliente, baos turcos ni saunas.  Colquese el cinturn de seguridad cuando conduzca.  No coma carne cruda ni queso sin cocinar; evite el contacto con las bandejas sanitarias de los gatos y la tierra que estos animales usan.  Tome las vitaminas prenatales.  Tome entre 1500 y  de calcio diariamente comenzando en la semana20 del embarazo [place].  Pruebe tomar un medicamento que la ayude a defecar (un laxante suave) si el mdico lo autoriza. Consuma ms fibra, que se encuentra en las frutas y verduras frescas y los cereales integrales. Beba suficiente lquido para mantener el pis (orina) claro o de color amarillo plido.  Dese baos de asiento con agua tibia para Engineer, materials o las molestias causadas por las hemorroides. Use una crema  para las hemorroides si el mdico la autoriza.  Si se le hinchan las venas (venas varicosas), use medias de descanso. Levante (eleve) los pies durante , 3 o 4veces por Futures trader. Limite el consumo de sal en su dieta.  No levante objetos pesados, use zapatos de tacones bajos y sintese derecha.  Descanse con las piernas elevadas si tiene calambres o dolor de cintura.  Visite a su dentista si no lo ha Occupational hygienist. Use un cepillo de cerdas suaves para cepillarse los dientes. Psese el hilo dental con suavidad.  Puede seguir [employer], a menos que el mdico le indique lo contrario.  No haga viajes de larga distancia, excepto si es obligatorio y solamente con la aprobacin del mdico.  Tome clases prenatales.  Practique ir manejando al hospital.  Prepare el bolso que llevar al hospital.  Prepare la habitacin del beb.  Concurra a los controles mdicos. SOLICITE AYUDA SI:  No est segura de si est en trabajo de parto o si ha roto la bolsa de las aguas.  Tiene mareos.  Siente calambres leves o presin en la parte inferior del abdomen.  Sufre un dolor persistente en el abdomen.  Tiene Programme researcher, broadcasting/film/video (nuseas), vmitos, o tiene deposiciones acuosas (diarrea).  Advierte un olor ftido que proviene de la vagina.  Siente dolor al [employer]. SOLICITE AYUDA DE INMEDIATO SI:   Tiene fiebre.  Tiene una prdida de lquido por la vagina.  Tiene sangrado o pequeas prdidas vaginales.  Siente dolor intenso o clicos en el abdomen.  Sube o baja de peso rpidamente.  Tiene dificultades para recuperar el aliento y siente dolor en el pecho.  Sbitamente se le hinchan mucho el rostro, las Crescentmanos, los tobillos, los pies o las piernas.  No ha sentido los movimientos del beb durante Georgianne Fickuna hora.  Siente un dolor de cabeza intenso que no se alivia con medicamentos.  Su visin se modifica.   Esta informacin no tiene Theme park managercomo fin reemplazar el  consejo del mdico. Asegrese de hacerle al mdico cualquier pregunta que tenga.   Document Released: 06/16/2013 Document Revised: 11/04/2014 Elsevier Interactive Patient Education 2016 [employer]   Southern CompanyEleccin del mtodo anticonceptivo (Contraception Choices) La anticoncepcin (control de la natalidad) es el uso de cualquier mtodo o dispositivo para Location managerevitar el embarazo. A continuacin se indican algunos de esos mtodos. MTODOS HORMONALES   El Implante contraconceptivo consiste en un tubo plstico delgado que contiene la hormona progesterona. No contiene estrgenos. El mdico inserta el tubo en la parte interna del brazo. El tubo puede Geneticist, molecularpermanecer en el lugar durante 3 aos. Despus de los 3 aos debe retirarse. El implante impide que los ovarios liberen vulos (ovulacin), espesa el moco cervical, lo que evita que los espermatozoides ingresen al tero y hace ms delgada la membrana que cubre el interior del tero.  Inyecciones de progesterona sola: las Insurance underwriteradministra el mdico cada 3 meses para Location managerevitar el embarazo. La progesterona sinttica impide que los ovarios liberen vulos. Tambin hacen que el moco cervical se espese y modifique el tejido de recubrimiento interno del tero. Esto hace ms difcil que los espermatozoides sobrevivan en el tero.  Las pldoras anticonceptivas contienen estrgenos y Education officer, museumprogesterona. Su funcin es ALLTEL Corporationevitar que los ovarios liberen vulos (ovulacin). Las hormonas de los anticonceptivos orales hacen que el moco cervical se haga ms espeso, lo que evita que el esperma ingrese al tero. Las pldoras anticonceptivas son recetadas por el mdico.Tambin se utilizan para tratar los perodos menstruales abundantes.  Minipldora: este tipo de pldora anticonceptiva contiene slo hormona progesterona. Deben tomarse todos los [address]  [address] del mes y debe recetarlas el mdico.  El parche de control de natalidad: contiene hormonas similares a las que contienen las pldoras anticonceptivas.  Deben cambiarse una vez por semana y se utilizan bajo prescripcin mdica.  Anillo vaginal: contiene hormonas similares a las que contienen las pldoras anticonceptivas. Se deja colocado durante tres semanas, se lo retira durante 1 semana y luego se coloca uno nuevo. La paciente debe sentirse cmoda al insertar y retirar el anillo de la vagina.Es necesaria la prescripcin mdica.  Anticonceptivos de emergencia: son mtodos para evitar un embarazo despus de Neomia Dearuna relacin sexual sin proteccin. Esta pldora puede tomarse inmediatamente despus de Child psychotherapisttener relaciones sexuales o hasta 5 [place] Freedomdas de haber tenido sexo sin proteccin. Es ms efectiva si se toma poco tiempo despus de la relacin sexual. Los anticonceptivos de emergencia estn disponibles sin prescripcin mdica. Consltelo con su farmacutico. No use los anticonceptivos de emergencia como nico mtodo anticonceptivo. MTODOS DE Lenis NoonBARRERA   Condn masculino: es una vaina delgada (ltex o goma) que se coloca cubriendo al pene durante el acto sexual. Deri Fuellinguede usarse con espermicida para aumentar la efectividad.  Condn femenino. Es una funda delicada y blanda que se adapta holgadamente a la vagina antes de las Clinical research associaterelaciones sexuales.  Diafragma: es una barrera de ltex redonda y suave que debe ser recomendado por un profesional. Se inserta en la vagina, junto con un gel espermicida. Debe insertarse antes de Management consultanttener relaciones sexuales. Debe dejar el diafragma colocado en la vagina durante 6 a 8 horas despus  de la relacin sexual.  Capuchn cervical: es una barrera de ltex o taza plstica redonda y [place] que cubre el cuello del tero y debe ser colocada por un mdico. Puede dejarlo colocado en la vagina hasta 48 horas despus de las Clinical research associate.  Esponja: es una pieza blanda y circular de espuma de poliuretano. Contiene un espermicida. Se inserta en la vagina despus de mojarla y antes de las [employer].  Espermicidas: son sustancias  qumicas que matan o bloquean al esperma y no lo dejan ingresar al cuello del tero y al tero. Vienen en forma de cremas, geles, supositorios, espuma o comprimidos. No es necesario tener Emergency planning/management officer. Se insertan en la vagina con un aplicador antes de Management consultant. El proceso debe repetirse cada vez que tiene relaciones sexuales. ANTICONCEPTIVOS INTRAUTERINOS  Dispositivo intrauterino (DIU) es un dispositivo en forma de T que se coloca en el tero durante el perodo menstrual, para Location manager. Hay dos tipos:  DIU de cobre: este tipo de DIU est recubierto con un alambre de cobre y se inserta dentro del tero. El cobre hace que el tero y las trompas de Falopio produzcan un liquido que [employer] espermatozoides. Puede permanecer colocado durante 10 aos.  DIU con hormona: este tipo de DIU contiene la hormona progestina (progesterona sinttica). La hormona espesa el moco cervical y evita que los espermatozoides ingresen al tero y tambin afina la membrana que cubre el tero para evitar la implantacin del vulo fertilizado. La hormona debilita o destruye los espermatozoides que ingresan al tero. Puede Geneticist, molecular durante 3-5 aos, segn el tipo de DIU que se [place]. MTODOS ANTICONCEPTIVOS PERMANENTES  Ligadura de trompas en la mujer: se realiza sellando, atando u obstruyendo quirrgicamente las trompas de Falopio lo que impide que el vulo descienda hacia el tero.  Esterilizacin histeroscpica: Implica la colocacin de un pequeo espiral o la insercin en cada trompa de Falopio. El mdico utiliza una tcnica llamada histeroscopa para Primary school teacher procedimiento. El dispositivo produce la formacin de tejido Designer, television/film set. Esto da como resultado una obstruccin permanente de las trompas de Falopio, de modo que la esperma no pueda fertilizar el vulo. Demora alrededor de 3 meses despus del procedimiento hasta que el conducto se obstruye. Tendr que usar otro mtodo  anticonceptivo durante al menos 3 meses.  Esterilizacin masculina: se realiza ligando los conductos por los que pasan los espermatozoides (vasectoma).Esto impide que el esperma ingrese a la vagina durante el acto sexual. Luego del procedimiento, el hombre puede eyacular lquido (semen). MTODOS DE PLANIFICACIN NATURAL  Planificacin familiar natural: consiste en no Management consultant o usar un mtodo de barrera (condn, [place], capuchn cervical) en los [employer] la mujer podra quedar [place].  Mtodo de calendario: consiste en el seguimiento de la duracin de cada ciclo menstrual y la identificacin de los perodos frtiles.  Mtodo de ovulacin: Paramedic las relaciones sexuales durante la ovulacin.  Mtodo sintotrmico: Advertising copywriter sexuales en la poca en la que se est ovulando, utilizando un termmetro y tendiendo en cuenta los sntomas de la ovulacin.  Mtodo postovulacin: Youth worker las relaciones sexuales para despus de haber ovulado. Independientemente del tipo o mtodo anticonceptivo que usted elija, es importante que use condones para protegerse contra las infecciones de transmisin sexual (ETS). Hable con su mdico con respecto a qu mtodo anticonceptivo es el ms apropiado para usted.   Esta informacin no tiene Theme park manager el consejo del mdico. Manufacturing engineer  de hacerle al mdico cualquier pregunta que tenga.   Document Released: 10/14/2005 Document Revised: 06/16/2013 Elsevier Interactive Patient Education [employer]

## 2016-05-09 NOTE — Progress Notes (Signed)
[name] is a 25 y.o. G1P0000 at 7160w1d for routine follow up.  She reports one episode of dizziness that lasted seconds about 5 days ago. She didn't have further episode. She also reports some pressure like pain in her LLQ. Denies contraction. Denies vaginal bleeding, gush or leak of fluid or excessive vaginal discharge. She reports fetal movement, unchanged from baseline. See flow sheet for details.  A/P: Pregnancy at 3860w1d.  Doing well.  Pregnancy issues include A1GDM FHT: 147 FH: 29 wks  Infant feeding choice: breast Contraception choice: handout given today Infant circumcision desired: not discussed  Tdapwas not given today. We are out to Tdap. Patient to get it at next visit. GTT, CBC, RPR, and HIV were done last visit (04/18/2016).  Pregnancy medical home forms were done today and reviewed.   RH status was reviewed and pt does not need Rhogam. [facility]  Childbirth and education classes were offered. Preterm labor precautions reviewed. [facility]

## 2016-05-20 ENCOUNTER — Ambulatory Visit (INDEPENDENT_AMBULATORY_CARE_PROVIDER_SITE_OTHER): Payer: Self-pay | Admitting: Obstetrics and Gynecology

## 2016-05-20 ENCOUNTER — Encounter: Payer: Self-pay | Attending: Obstetrics and Gynecology | Admitting: Dietician

## 2016-05-20 VITALS — BP 112/68 | HR 85 | Wt 213.0 lb

## 2016-05-20 DIAGNOSIS — Z23 Encounter for immunization: Secondary | ICD-10-CM

## 2016-05-20 DIAGNOSIS — Z713 Dietary counseling and surveillance: Secondary | ICD-10-CM | POA: Insufficient documentation

## 2016-05-20 DIAGNOSIS — Z3403 Encounter for supervision of normal first pregnancy, third trimester: Secondary | ICD-10-CM

## 2016-05-20 DIAGNOSIS — O34219 Maternal care for unspecified type scar from previous cesarean delivery: Secondary | ICD-10-CM

## 2016-05-20 DIAGNOSIS — O24919 Unspecified diabetes mellitus in pregnancy, unspecified trimester: Secondary | ICD-10-CM | POA: Insufficient documentation

## 2016-05-20 DIAGNOSIS — Z8774 Personal history of (corrected) congenital malformations of heart and circulatory system: Secondary | ICD-10-CM

## 2016-05-20 DIAGNOSIS — O24419 Gestational diabetes mellitus in pregnancy, unspecified control: Secondary | ICD-10-CM | POA: Insufficient documentation

## 2016-05-20 LAB — POCT URINALYSIS DIP (DEVICE)
Glucose, UA: NEGATIVE mg/dL
Glucose, UA: NEGATIVE mg/dL
Ketones, ur: NEGATIVE mg/dL
LEUKOCYTES UA: NEGATIVE
Leukocytes, UA: NEGATIVE
NITRITE: NEGATIVE
Nitrite: NEGATIVE
PH: 5.5 (ref 5.0–8.0)
Protein, ur: 30 mg/dL — AB
Protein, ur: 30 mg/dL — AB
Specific Gravity, Urine: >=1.03 (ref 1.005–1.030)
Specific Gravity, Urine: 1.025 (ref 1.005–1.030)
Urobilinogen, UA: 0.2 mg/dL (ref 0.0–1.0)
Urobilinogen, UA: 0.2 mg/dL (ref 0.0–1.0)
pH: 5.5 (ref 5.0–8.0)

## 2016-05-20 NOTE — Patient Instructions (Addendum)
Diabetes mellitus gestacional (Gestational Diabetes Mellitus) La diabetes mellitus gestacional, ms comnmente conocida como diabetes gestacional es un tipo de diabetes que desarrollan algunas mujeres durante el embarazo. En la diabetes gestacional, el pncreas no produce suficiente insulina (una hormona) o las clulas son menos sensibles a la insulina producida (resistencia a la insulina), o ambas cosas. Normalmente, la insulina mueve los azcares de los alimentos a las clulas de los tejidos. Las clulas de los tejidos utilizan los azcares para obtener energa. La falta de insulina o la falta de una respuesta normal a la insulina hace que el exceso de azcar se acumule en la sangre en lugar de penetrar en las clulas de los tejidos. Como resultado, se producen niveles altos de azcar en la sangre (hiperglucemia). El efecto de los niveles altos de azcar (glucosa) puede causar muchos problemas.  FACTORES DE RIESGO Usted tiene mayor probabilidad de desarrollar diabetes gestacional si tiene antecedentes familiares de diabetes y tambin si tiene uno o ms de los siguientes factores de riesgo:  ndice de masa corporal superior a 30 (obesidad).  Embarazo previo con diabetes gestacional.  La edad avanzada en el momento del embarazo. Si se mantienen los niveles de glucosa en la sangre en un rango normal durante el embarazo, las mujeres pueden tener un embarazo saludable. Si los niveles de glucosa en la sangre no estn bien controlados, puede haber riesgos para usted, el feto o el recin nacido, o durante el trabajo de parto y el parto.  SNTOMAS  Si se presentan sntomas, stos son similares a los sntomas que normalmente experimentar durante el embarazo. Los sntomas de la diabetes gestacional son:   Aumento de la sed (polidipsia).  Aumento de la miccin (poliuria).  Orina con ms frecuencia durante la noche (nocturia).  Prdida de peso. La prdida de peso puede ser muy rpida.  Infecciones  frecuentes y recurrentes.  Cansancio (fatiga).  Debilidad.  Cambios en la visin, como visin borrosa.  Olor a fruta en el aliento.  Dolor abdominal. DIAGNSTICO La diabetes se diagnostica cuando hay aumento de los niveles de glucosa en la sangre. El nivel de glucosa en la sangre puede controlarse en uno o ms de los siguientes anlisis de sangre:  Medicin de glucosa en la sangre en ayunas. No se le permitir comer durante al menos 8 horas antes de que se tome una muestra de sangre.  Pruebas al azar de glucosa en la sangre. El nivel de glucosa en la sangre se controla en cualquier momento del da sin importar el momento en que haya comido.  Prueba de tolerancia a la glucosa oral (PTGO). La glucosa en la sangre se mide despus de no haber comido (ayunas) durante una a tres horas y despus de beber una bebida que contenga glucosa. Dado que las hormonas que causan la resistencia a la insulina son ms altas alrededor de las semanas 24 a 28 de embarazo, generalmente se realiza una PTGO durante ese tiempo. Si tiene factores de riesgo, en la primera visita prenatal pueden hacerle pruebas de deteccin de diabetes tipo 2 no diagnosticada. TRATAMIENTO  La diabetes gestacional debe controlarse en primer lugar con dieta y ejercicios. Pueden agregarse medicamentos, pero solo si son necesarios.  Usted tendr que tomar medicamentos para la diabetes o insulina diariamente para mantener los niveles de glucosa en la sangre en el rango deseado.  Usted tendr que combinar la dosis de insulina con la actividad fsica y la eleccin de alimentos saludables. Si tiene diabetes gestacional, el objetivo del tratamiento ser   mantener los siguientes niveles sanguneos de glucosa:  Antes de las comidas (preprandial): valor de 95 mg/dl o inferior.  Despus de las comidas (posprandial):  Una hora despus de la comida: valor de 140 mg/dl o inferior.  Dos horas despus de la comida: valor de 120 mg/dl o  inferior. Si tiene diabetes tipo 1 o tipo 2 preexistente, el objetivo del tratamiento ser mantener los siguientes niveles sanguneos de glucosa:  Antes de las comidas, a la hora de acostarse y durante la noche: de 60 a 99 mg/dl.  Despus de las comidas: valor mximo de 100 a 129 mg/dl. INSTRUCCIONES PARA EL CUIDADO EN EL HOGAR   Controle su nivel de hemoglobina A1c dos veces al ao.  Contrlese a diario el nivel de glucosa en la sangre segn las indicaciones de su mdico. Es comn realizar controles frecuentes de la glucosa en la sangre.  Supervise las cetonas en la orina cuando est enferma y segn las indicaciones de su mdico.  Tome el medicamento para la diabetes y adminstrese insulina segn las indicaciones de su mdico para mantener el nivel de glucosa en la sangre en el rango deseado.  Nunca se quede sin medicamento para la diabetes o sin insulina. Es necesario que la reciba todos los das.  Ajuste la insulina segn la ingesta de hidratos de carbono. Los hidratos de carbono pueden aumentar los niveles de glucosa en la sangre, pero deben incluirse en su dieta. Los hidratos de carbono aportan vitaminas, minerales y fibra que son una parte esencial de una dieta saludable. Los hidratos de carbono se encuentran en frutas, verduras, cereales integrales, productos lcteos, legumbres y alimentos que contienen azcares aadidos.  Consuma alimentos saludables. Alterne 3 comidas con 3 colaciones.  Aumente de peso saludablemente. El aumento del peso total vara de acuerdo con el ndice de masa corporal que tena antes del embarazo (IMC).  Lleve una tarjeta de alerta mdica o use una pulsera o medalla de alerta mdica.  Lleve con usted una colacin de 15gramos de hidratos de carbono en todo momento para controlar los niveles bajos de glucosa en la sangre (hipoglucemia). Algunos ejemplos de colaciones de 15gramos de hidratos de carbono son los siguientes:  Tabletas de glucosa, 3 o 4.  Gel  de glucosa, tubo de 15 gramos.  Pasas de uva, 2 cucharadas (24 g).  Caramelos de goma, 6.  Galletas de animales, 8.  Jugo de fruta, gaseosa comn, o leche descremada, 4 onzas (120 ml).  Pastillas de goma, 9.  Reconocer la hipoglucemia. Durante el embarazo la hipoglucemia se produce cuando hay niveles de glucosa en la sangre de 60 mg/dl o menos. El riesgo de hipoglucemia aumenta durante el ayuno o cuando se saltea las comidas, durante o despus de realizar ejercicio intenso y mientras duerme. Los sntomas de hipoglucemia son:  Temblores o sacudidas.  Disminucin de la capacidad de concentracin.  Sudoracin.  Aumento de la frecuencia cardaca.  Dolor de cabeza.  Sequedad en la boca.  Hambre.  Irritabilidad.  Ansiedad.  Sueo agitado.  Alteracin del habla o de la coordinacin.  Confusin.  Tratar la hipoglucemia rpidamente. Si usted est alerta y puede tragar con seguridad, siga la regla de 15/15 que consiste en:  Tome entre 15 y 20gramos de glucosa de accin rpida o carbohidratos. Las opciones de accin rpida son un gel de glucosa, tabletas de glucosa, o 4 onzas (120 ml) de jugo de frutas, gaseosa comn, o leche baja en grasa.  Compruebe su nivel de glucosa en la sangre   15 minutos despus de tomar la glucosa.  Tome entre 15 y 20 gramos ms de glucosa si el nivel de glucosa en la sangre todava es de 70mg/dl o inferior.  Ingiera una comida o una colacin en el lapso de 1 hora una vez que los niveles de glucosa en la sangre vuelven a la normalidad.  Est atento a la poliuria (miccin excesiva) y la polidipsia (sensacin de mucha sed), que son los primeros signos de la hiperglucemia. El reconocimiento temprano de la hiperglucemia permite un tratamiento oportuno. Trate la hiperglucemia segn le indic su mdico.  Haga actividad fsica por lo menos 30minutos al da o como lo indique su mdico. Se recomienda que 30 minutos despus de cada comida, realice diez minutos  de actividad fsica para controlar los niveles de glucosa postprandial en la sangre.  Ajuste su dosis de insulina y la ingesta de alimentos, segn sea necesario, si inicia un nuevo ejercicio o deporte.  Siga su plan para los das de enfermedad cuando no pueda comer o beber como de costumbre.  Evite el tabaco y el alcohol.  Concurra a todas las visitas de control como se lo haya indicado el mdico.  Siga el consejo del mdico respecto a los controles prenatales y posteriores al parto (postparto), las visitas, la planificacin de las comidas, el ejercicio, los medicamentos, las vitaminas, los anlisis de sangre, otras pruebas mdicas y actividades fsicas.  Realice diariamente el cuidado de la piel y de los pies. Examine su piel y los pies diariamente para ver si tiene cortes, moretones, enrojecimiento, problemas en las uas, sangrado, ampollas o llagas.  Cepllese los dientes y encas por lo menos dos veces al da y use hilo dental al menos una vez por da. Concurra regularmente a las visitas de control con el dentista.  Programe un examen de vista durante el primer trimestre de su embarazo o como lo indique su mdico.  Comparta su plan de control de diabetes en el trabajo o en la escuela.  Mantngase al da con las vacunas.  Aprenda a manejar el estrs.  Obtenga la mayor cantidad posible de informacin sobre la diabetes y solicite ayuda siempre que sea necesario.  Obtenga informacin sobre el amamantamiento y analice esta posibilidad.  Debe controlar el nivel de azcar en la sangre de 6a 12semanas despus del parto. Esto se hace con una prueba de tolerancia a la glucosa oral (PTGO). SOLICITE ATENCIN MDICA SI:   No puede comer alimentos o beber por ms de 6 horas.  Tuvo nuseas o ha vomitado durante ms de 6 horas.  Tiene un nivel de glucosa en la sangre de 200 mg/dl y cetonas en la orina.  Presenta algn cambio en el estado mental.  Desarrolla problemas de visin.  Sufre  un dolor persistente de cabeza.  Siente dolor o molestias en la parte superior del abdomen.  Desarrolla una enfermedad grave adicional.  Tuvo diarrea durante ms de 6 horas.  Ha estado enfermo o ha tenido fiebre durante un par de das y no mejora. SOLICITE ATENCIN MDICA DE INMEDIATO SI:   Tiene dificultad para respirar.  Ya no siente los movimientos del beb.  Est sangrando o tiene flujo vaginal.  Comienza a tener contracciones o trabajo de parto prematuro. ASEGRESE DE QUE:  Comprende estas instrucciones.  Controlar su afeccin.  Recibir ayuda de inmediato si no mejora o si empeora.   Esta informacin no tiene como fin reemplazar el consejo del mdico. Asegrese de hacerle al mdico cualquier pregunta que tenga.     Document Released: 07/24/2005 Document Revised: 11/04/2014 Elsevier Interactive Patient Education 2016 [employer].  [employer] del mtodo anticonceptivo (Contraception Choices) La anticoncepcin (control de la natalidad) es el uso de cualquier mtodo o dispositivo para Location manager. A continuacin se indican algunos de esos mtodos. MTODOS HORMONALES   El Implante contraconceptivo consiste en un tubo plstico delgado que contiene la hormona progesterona. No contiene estrgenos. El mdico inserta el tubo en la parte interna del brazo. El tubo puede Geneticist, molecular durante 3 aos. Despus de los 3 aos debe retirarse. El implante impide que los ovarios liberen vulos (ovulacin), espesa el moco cervical, lo que evita que los espermatozoides ingresen al tero y hace ms delgada la membrana que cubre el interior del tero.  Inyecciones de progesterona sola: las Insurance underwriter cada 3 meses para Location manager. La progesterona sinttica impide que los ovarios liberen vulos. Tambin hacen que el moco cervical se espese y modifique el tejido de recubrimiento interno del tero. Esto hace ms difcil que los espermatozoides sobrevivan en el  tero.  Las pldoras anticonceptivas contienen estrgenos y Education officer, museum. Su funcin es [employer] ovarios liberen vulos (ovulacin). Las hormonas de los anticonceptivos orales hacen que el moco cervical se haga ms espeso, lo que evita que el esperma ingrese al tero. Las pldoras anticonceptivas son recetadas por el mdico.Tambin se utilizan para tratar los perodos menstruales abundantes.  Minipldora: este tipo de pldora anticonceptiva contiene slo hormona progesterona. Deben tomarse todos los [address] del mes y debe recetarlas el mdico.  El parche de control de natalidad: contiene hormonas similares a las que contienen las pldoras anticonceptivas. Deben cambiarse una vez por semana y se utilizan bajo prescripcin mdica.  Anillo vaginal: contiene hormonas similares a las que contienen las pldoras anticonceptivas. Se deja colocado durante tres semanas, se lo retira durante 1 semana y luego se coloca uno nuevo. La paciente debe sentirse cmoda al insertar y retirar el anillo de la vagina.Es necesaria la prescripcin mdica.  Anticonceptivos de emergencia: son mtodos para evitar un embarazo despus de Neomia Dear relacin sexual sin proteccin. Esta pldora puede tomarse inmediatamente despus de Child psychotherapist sexuales o hasta 5 [place] de haber tenido sexo sin proteccin. Es ms efectiva si se toma poco tiempo despus de la relacin sexual. Los anticonceptivos de emergencia estn disponibles sin prescripcin mdica. Consltelo con su farmacutico. No use los anticonceptivos de emergencia como nico mtodo anticonceptivo. MTODOS DE Lenis Noon   Condn masculino: es una vaina delgada (ltex o goma) que se coloca cubriendo al pene durante el acto sexual. Deri Fuelling con espermicida para aumentar la efectividad.  Condn femenino. Es una funda delicada y blanda que se adapta holgadamente a la vagina antes de las Clinical research associate.  Diafragma: es una barrera de ltex redonda y suave que debe ser  recomendado por un profesional. Se inserta en la vagina, junto con un gel espermicida. Debe insertarse antes de Management consultant. Debe dejar el diafragma colocado en la vagina durante 6 a 8 horas despus de la relacin sexual.  Capuchn cervical: es una barrera de ltex o taza plstica redonda y [place] que cubre el cuello del tero y debe ser colocada por un mdico. Puede dejarlo colocado en la vagina hasta 48 horas despus de las Clinical research associate.  Esponja: es una pieza blanda y circular de espuma de poliuretano. Contiene un espermicida. Se inserta en la vagina despus de mojarla y antes de las [employer].  Espermicidas: son sustancias qumicas que matan o bloquean  al esperma y no lo dejan ingresar al cuello del tero y al tero. Vienen en forma de cremas, geles, supositorios, espuma o comprimidos. No es necesario tener Emergency planning/management officer. Se insertan en la vagina con un aplicador antes de Management consultant. El proceso debe repetirse cada vez que tiene relaciones sexuales. ANTICONCEPTIVOS INTRAUTERINOS  Dispositivo intrauterino (DIU) es un dispositivo en forma de T que se coloca en el tero durante el perodo menstrual, para Location manager. Hay dos tipos:  DIU de cobre: este tipo de DIU est recubierto con un alambre de cobre y se inserta dentro del tero. El cobre hace que el tero y las trompas de Falopio produzcan un liquido que [employer] espermatozoides. Puede permanecer colocado durante 10 aos.  DIU con hormona: este tipo de DIU contiene la hormona progestina (progesterona sinttica). La hormona espesa el moco cervical y evita que los espermatozoides ingresen al tero y tambin afina la membrana que cubre el tero para evitar la implantacin del vulo fertilizado. La hormona debilita o destruye los espermatozoides que ingresan al tero. Puede Geneticist, molecular durante 3-5 aos, segn el tipo de DIU que se [place]. MTODOS ANTICONCEPTIVOS PERMANENTES  Ligadura  de trompas en la mujer: se realiza sellando, atando u obstruyendo quirrgicamente las trompas de Falopio lo que impide que el vulo descienda hacia el tero.  Esterilizacin histeroscpica: Implica la colocacin de un pequeo espiral o la insercin en cada trompa de Falopio. El mdico utiliza una tcnica llamada histeroscopa para Primary school teacher procedimiento. El dispositivo produce la formacin de tejido Designer, television/film set. Esto da como resultado una obstruccin permanente de las trompas de Falopio, de modo que la esperma no pueda fertilizar el vulo. Demora alrededor de 3 meses despus del procedimiento hasta que el conducto se obstruye. Tendr que usar otro mtodo anticonceptivo durante al menos 3 meses.  Esterilizacin masculina: se realiza ligando los conductos por los que pasan los espermatozoides (vasectoma).Esto impide que el esperma ingrese a la vagina durante el acto sexual. Luego del procedimiento, el hombre puede eyacular lquido (semen). MTODOS DE PLANIFICACIN NATURAL  Planificacin familiar natural: consiste en no Management consultant o usar un mtodo de barrera (condn, [place], capuchn cervical) en los [employer] la mujer podra quedar [place].  Mtodo de calendario: consiste en el seguimiento de la duracin de cada ciclo menstrual y la identificacin de los perodos frtiles.  Mtodo de ovulacin: Paramedic las relaciones sexuales durante la ovulacin.  Mtodo sintotrmico: Advertising copywriter sexuales en la poca en la que se est ovulando, utilizando un termmetro y tendiendo en cuenta los sntomas de la ovulacin.  Mtodo postovulacin: Youth worker las relaciones sexuales para despus de haber ovulado. Independientemente del tipo o mtodo anticonceptivo que usted elija, es importante que use condones para protegerse contra las infecciones de transmisin sexual (ETS). Hable con su mdico con respecto a qu mtodo anticonceptivo es el ms  apropiado para usted.   Esta informacin no tiene Theme park manager el consejo del mdico. Asegrese de hacerle al mdico cualquier pregunta que tenga.   Document Released: 10/14/2005 Document Revised: 06/16/2013 Elsevier Interactive Patient Education 2016 [employer].   [place] materna (Breastfeeding) [place] Museum/gallery exhibitions officer es una de las mejores elecciones que puede hacer por usted y su beb. El cambio hormonal durante el Psychiatrist produce el desarrollo del tejido mamario y [place] la cantidad y el tamao de los conductos galactforos. Estas hormonas tambin permiten que las protenas, los azcares y las grasas de la sangre produzcan  la [employer] glndulas productoras de [place]. Las hormonas impiden que la leche materna sea liberada antes del nacimiento del beb, adems de impulsar el flujo de leche luego del nacimiento. Una vez que ha comenzado a Museum/gallery exhibitions officer, Conservation officer, nature beb, as Immunologist succin o Theatre manager, pueden estimular la liberacin de [place] de las glndulas productoras de [place].  LOS BENEFICIOS DE AMAMANTAR Para el beb  La primera leche (calostro) ayuda a Careers information officer funcionamiento del sistema digestivo del beb.  La leche tiene anticuerpos que ayudan a Radio producer las infecciones en el beb.  El beb tiene una menor incidencia de asma, alergias y del sndrome de muerte sbita del lactante.  Los nutrientes en la [place] materna son mejores para el beb que la [place] maternizada y estn preparados exclusivamente para cubrir las necesidades del beb.  La leche materna mejora el desarrollo cerebral del beb.  Es menos probable que el beb desarrolle otras enfermedades, como obesidad infantil, asma o diabetes mellitus de tipo 2. Para usted   La lactancia materna favorece el desarrollo de un vnculo muy especial entre la madre y el beb.  Es conveniente. La leche materna siempre est disponible a la Human resources officer y es [place].  La lactancia materna ayuda a quemar  caloras y a perder el peso ganado durante el [place].  Favorece la contraccin del tero al tamao que tena antes del embarazo de manera ms rpida y disminuye el sangrado (loquios) despus del parto.  La lactancia materna contribuye a reducir Nurse, adult de desarrollar diabetes mellitus de tipo 2, osteoporosis o cncer de mama o de ovario en el futuro. SIGNOS DE QUE EL BEB EST HAMBRIENTO Primeros signos de [address] de [place].  Se estira.  Mueve la cabeza de un lado a otro.  Mueve la cabeza y abre la boca cuando se le toca la mejilla o la comisura de la boca (reflejo de bsqueda).  Aumenta las vocalizaciones, tales como sonidos de succin, se relame los labios, emite arrullos, suspiros, o chirridos.  Mueve la [employer] boca.  Se chupa con ganas los dedos o las manos. Signos tardos de [employer].  Llora de manera intermitente. Signos de [employer] signos de hambre extrema requerirn que lo calme y lo consuele antes de que el beb pueda alimentarse adecuadamente. No espere a que se manifiesten los siguientes signos de hambre extrema para comenzar a Museum/gallery exhibitions officer:   Designer, jewellery.  Llanto intenso y fuerte.  Gritos. INFORMACIN BSICA SOBRE LA LACTANCIA MATERNA Iniciacin de la lactancia materna  Encuentre un lugar cmodo para sentarse o acostarse, con un buen respaldo para el cuello y la espalda.  Coloque una almohada o una manta enrollada debajo del beb para acomodarlo a la altura de la mama (si est sentada). Las almohadas para Museum/gallery exhibitions officer se han diseado especialmente a fin de servir de apoyo para los brazos y el beb [employer].  Asegrese de que el abdomen del beb est frente al suyo.   Masajee suavemente la mama. Con las yemas de los dedos, masajee la pared del pecho hacia el pezn en un movimiento circular. Esto estimula el flujo de [place]. Es posible que Engineer, manufacturing systems este movimiento mientras amamanta si la leche  fluye lentamente.  Sostenga la mama con el pulgar por arriba del pezn y los otros 4 dedos por debajo de la mama. Asegrese de que los dedos se encuentren lejos del pezn y de la boca del beb.  Empuje suavemente los labios del beb con el pezn o con el dedo.  Cuando la boca del beb se abra lo suficiente, acrquelo rpidamente a la mama e introduzca todo el pezn y la zona oscura que lo rodea (areola), tanto como sea posible, dentro de la boca del beb.  Debe haber ms areola visible por arriba del labio superior del beb que por debajo del labio inferior.  La lengua del beb debe estar entre la enca inferior y la [place].  Asegrese de que la boca del beb est en la posicin correcta alrededor del pezn (prendida). Los labios del beb deben crear un sello sobre la mama y estar doblados hacia afuera (invertidos).  Es comn que el beb succione durante 2 a 3 minutos para que comience el flujo de [place]. Cmo debe prenderse Es muy importante que le ensee al beb cmo prenderse adecuadamente a la mama. Si el beb no se prende adecuadamente, puede causarle dolor en el pezn y reducir la produccin de [place], y hacer que el beb tenga un escaso aumento de [place]. Adems, si el beb no se prende adecuadamente al pezn, puede tragar aire durante la alimentacin. Esto puede causarle molestias al beb. Hacer eructar al beb al [place] de mama puede ayudarlo a liberar el aire. Sin embargo, ensearle al beb cmo prenderse a la mama adecuadamente es la mejor manera de evitar que se sienta molesto por tragar Oceanographer se alimenta. Signos de que el beb se ha prendido adecuadamente al pezn:   Payton Doughty o succiona de modo silencioso, sin causarle dolor.  Se escucha que traga cada 3 o 4 succiones.  Hay movimientos musculares por arriba y por delante de sus odos al Printmaker. Signos de que el beb no se ha prendido Audiological scientist al pezn:   Hace ruidos de succin o de chasquido mientras  se alimenta.  Siente dolor en el pezn. Si cree que el beb no se prendi correctamente, deslice el dedo en la comisura de la boca y [employer] las encas del beb para interrumpir la succin. Intente comenzar a amamantar nuevamente. Signos de Fish farm manager Signos del beb:   Disminuye gradualmente el nmero de succiones o cesa la succin por completo.  Se duerme.  Relaja el cuerpo.  Retiene una pequea cantidad de [facility] boca.  Se desprende solo del pecho. Signos que presenta usted:  Las mamas han aumentado la firmeza, el peso y el tamao 1 a 3 horas despus de Museum/gallery exhibitions officer.  Estn ms blandas inmediatamente despus de amamantar.  Un aumento del volumen de [place], y tambin un cambio en su consistencia y color se producen hacia el quinto da de Tour manager.  Los pezones no duelen, ni estn agrietados ni sangran. Signos de que su beb recibe la cantidad de leche suficiente  Moja al menos 3 paales en 24 horas. La orina debe ser clara y de color amarillo plido a los [address]  Po [address] de [place].  Defeca al menos 3 veces en 24 horas a los 5 [address]  [address] de [address] La materia fecal debe ser blanda y [place].  Defeca al menos 3 veces en 24 horas a los [address] de [address] La materia fecal debe ser grumosa y [place].  No registra una prdida de peso mayor del 10% del peso al nacer durante los primeros 3 [address]  [address] de [place].  Aumenta de peso un promedio de 4 a 7onzas (113 a 198g) por semana despus de los 4 [address]  [address] de vida.  Aumenta de [place],  diariamente, de [address] uniforme a partir de los [address] de vida, sin Passenger transport manager prdida de peso despus de las 2semanas de vida. Despus de alimentarse, es posible que el beb regurgite una pequea cantidad. Esto es frecuente. FRECUENCIA Y DURACIN DE LA LACTANCIA MATERNA El amamantamiento frecuente la ayudar a producir ms [place] y a Education officer, community de Engineer, mining en los pezones e hinchazn en las [place]. Alimente al beb cuando muestre signos de  hambre o si siente la necesidad de reducir la congestin de las [place]. Esto se denomina "lactancia a demanda". Evite el uso del chupete mientras trabaja para establecer la lactancia (las primeras 4 a 6 semanas despus del nacimiento del beb). Despus de este perodo, podr ofrecerle un chupete. Las investigaciones demostraron que el uso del chupete durante el primer ao de vida del beb disminuye el riesgo de desarrollar el sndrome de muerte sbita del lactante (SMSL). Permita que el nio se alimente en cada mama todo lo que desee. Contine amamantando al beb hasta que haya terminado de alimentarse. Cuando el beb se desprende o se queda dormido mientras se est alimentando de la primera mama, ofrzcale la segunda. Debido a que, con frecuencia, los recin [place] las primeras semanas de vida, es posible que deba despertar al beb para alimentarlo. Los horarios de Acupuncturist de un beb a otro. Sin embargo, las siguientes reglas pueden servir como gua para ayudarla a Lawyer que el beb se alimenta adecuadamente:  Se puede amamantar a los recin nacidos (bebs de 4 semanas o menos de vida) cada 1 a 3 horas.  No deben transcurrir ms de 3 horas durante el da o 5 horas durante la noche sin que se amamante a los recin nacidos.  Debe amamantar al beb 8 veces como mnimo en un perodo de 24 horas, hasta que comience a introducir slidos en su dieta, a los 6 meses de vida aproximadamente. EXTRACCIN DE [employer] MATERNA La extraccin y Contractor de la leche materna le permiten asegurarse de que el beb se alimente exclusivamente de [place], aun en momentos en los que no puede amamantar. Esto tiene especial importancia si debe regresar al Aleen Campi en el perodo en que an est amamantando o si no puede estar presente en los momentos en que el beb debe alimentarse. Su asesor en lactancia puede orientarla sobre cunto tiempo es seguro almacenar [place].  El  sacaleche es un aparato que le permite extraer leche de la mama a un recipiente estril. Luego, la leche materna extrada puede almacenarse en un refrigerador o Electrical engineer. Algunos sacaleches son Birdie Riddle, Delaney Meigs otros son elctricos. Consulte a su asesor en lactancia qu tipo ser ms conveniente para usted. Los sacaleches se pueden comprar; sin embargo, algunos hospitales y grupos de apoyo a la lactancia materna alquilan Sports coach. Un asesor en lactancia puede ensearle cmo extraer [employer], en caso de que prefiera no usar un sacaleche.  CMO CUIDAR LAS MAMAS DURANTE LA LACTANCIA MATERNA Los pezones se secan, agrietan y duelen durante la Tour manager. Las siguientes recomendaciones pueden ayudarla a Pharmacologist las [employer] y sanas:  Careers information officer usar jabn en los pezones.  Use un sostn de soporte. Aunque no son esenciales, las camisetas sin mangas o los sostenes especiales para Museum/gallery exhibitions officer estn diseados para acceder fcilmente a las mamas, para Museum/gallery exhibitions officer sin tener que quitarse todo el sostn o la camiseta. Evite usar sostenes con aro o sostenes muy ajustados.  Seque al aire sus pezones durante 3 a despus  de amamantar al beb.  Utilice solo apsitos de Haematologist sostn para Environmental health practitioner las prdidas de [place]. La prdida de un poco de [employer] tomas es normal.  Utilice lanolina sobre los pezones luego de Museum/gallery exhibitions officer. La lanolina ayuda a mantener la humedad normal de la piel. Si [place] lanolina pura, no tiene que lavarse los pezones antes de volver a Corporate treasurer al beb. La lanolina pura no es txica para el beb. Adems, puede extraer [employer] algunas gotas de [place] materna y Engineer, maintenance (IT) suavemente esa [employer], para que la [place] se seque al aire. Durante las primeras semanas despus de dar a luz, algunas mujeres pueden experimentar hinchazn en las mamas (congestin [place]). La congestin puede hacer que sienta las mamas  pesadas, calientes y sensibles al tacto. El pico de la congestin ocurre dentro de los 3 a 5 das despus del [place]. Las siguientes recomendaciones pueden ayudarla a Paramedic la congestin:  Vace por completo las mamas al [employer] o Environmental health practitioner. Puede aplicar calor hmedo en las mamas (en la ducha o con toallas hmedas para manos) antes de Museum/gallery exhibitions officer o extraer [employer]. Esto aumenta la circulacin y [place] a que la [place]. Si el beb no vaca por completo las [address] cuando lo [address], extraiga la [place] restante despus de que haya finalizado.  Use un sostn ajustado (para amamantar o comn) o una camiseta sin mangas durante 1 o 2 das para indicar al cuerpo que disminuya ligeramente la produccin de [place].  Aplique compresas de hielo [employer], a menos que le resulte demasiado incmodo.  Asegrese de que el beb est prendido y se encuentre en la posicin correcta mientras lo alimenta. Si la congestin persiste luego de 48 horas o despus de seguir estas recomendaciones, comunquese con su mdico o un Holiday representative. RECOMENDACIONES GENERALES PARA EL CUIDADO DE LA SALUD DURANTE LA LACTANCIA MATERNA  Consuma alimentos saludables. Alterne comidas y colaciones, y coma 3 de cada una por da. Dado que lo que come [employer], es posible que algunas comidas hagan que su beb se vuelva ms irritable de lo habitual. Evite comer este tipo de alimentos si percibe que afectan de manera negativa al beb.  Beba leche, jugos de fruta y agua para Patent examiner su sed (aproximadamente 10 vasos al Futures trader).  Descanse con frecuencia, reljese y tome sus vitaminas prenatales para evitar la fatiga, el estrs y la anemia.  Contine con los autocontroles de la mama.  Evite Product manager y fumar tabaco. Las sustancias qumicas de los cigarrillos que pasan a la leche materna y la exposicin al humo ambiental del tabaco pueden daar al beb.  No consuma alcohol ni drogas, incluida la marihuana. Algunos  medicamentos, que pueden ser perjudiciales para el beb, pueden pasar a travs de la [employer]. Es importante que consulte a su mdico antes de Medical sales representative, incluidos todos los medicamentos recetados y de [place], as como los suplementos vitamnicos y herbales. Puede quedar embarazada durante la lactancia. Si desea controlar la natalidad, consulte a su mdico cules son las opciones ms seguras para el beb. SOLICITE ATENCIN MDICA SI:   Usted siente que quiere dejar de Museum/gallery exhibitions officer o se siente frustrada con la lactancia.  Siente dolor en las mamas o en los pezones.  Sus pezones estn agrietados o Water quality scientist.  Sus pechos estn irritados, sensibles o calientes.  Tiene un rea hinchada en cualquiera de las mamas.  Siente escalofros o fiebre.  Tiene nuseas o vmitos.  Presenta una secrecin de otro lquido distinto de la leche materna de los pezones.  Sus mamas no se llenan antes de Museum/gallery exhibitions officer al beb para el quinto da despus del [place].  Se siente triste y deprimida.  El beb est demasiado somnoliento como para comer bien.  El beb tiene problemas para dormir.  Moja menos de 3 paales en 24 horas.  Defeca menos de 3 veces en 24 horas.  La piel del beb o la parte blanca de los ojos se vuelven amarillentas.  El beb no ha aumentado de [place] a los [address] de [place]. SOLICITE ATENCIN MDICA DE INMEDIATO SI:   El beb est muy cansado Retail buyer) y no se quiere despertar para comer.  Le sube la fiebre sin causa.   Esta informacin no tiene Theme park manager el consejo del mdico. Asegrese de hacerle al mdico cualquier pregunta que tenga.   Document Released: 10/14/2005 Document Revised: 07/05/2015 Elsevier Interactive Patient Education [employer]

## 2016-05-20 NOTE — Progress Notes (Signed)
  Subjective:    Kimberly Reid is a G1P0000 [redacted]w[redacted]d being seen today for her first obstetrical visit. Patient transferring care from [facility] clinic secondary to GDM diagnosis. Her obstetrical history is significant for gestational diabetes and h/o aortic coarctation repaired. Patient does intend to breast feed. Pregnancy history fully reviewed.  Patient reports no complaints.  Vitals:   05/20/16 1354  BP: 112/68  Pulse: 85  Weight: 213 lb (96.6 kg)    HISTORY: OB History  Gravida Para Term Preterm AB Living  1 0 0 0 0 0  SAB TAB Ectopic Multiple Live Births  0 0 0 0      # Outcome Date GA Lbr Len/2nd Weight Sex Delivery Anes PTL Lv  1 Current              Past Medical History:  Diagnosis Date  . 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 Heart murmur   . Polycystic ovary syndrome 03/24/2013   Past Surgical History:  Procedure Laterality Date  . CARDIAC SURGERY     Family History  Problem Relation Age of Onset  . Diabetes Mother      Exam    Not indicated    Assessment:    Pregnancy: G1P0000 Patient Active Problem List   Diagnosis Date Noted  . Gestational diabetes mellitus (GDM) affecting pregnancy, antepartum (HCC) 05/20/2016  . Supervision of normal first pregnancy 02/20/2016  . URI (upper respiratory infection) 02/20/2016  . PCOS (polycystic ovarian syndrome) 05/26/2013  . 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 History of repair of coarctation of aorta 05/26/2013        Plan:     Initial labs drawn. Prenatal vitamins. Problem list reviewed and updated. Genetic Screening discussed too late  Ultrasound discussed; fetal survey: results reviewed. Given maternal history of cardiac defect will order fetal echo Discussed the need of euglycemia during pregnancy to decrease the occurrence of complications such as fetal macrosomia, shoulder dystocia, IUFD and neonatal demise Patient to see diabetic educator today  Follow up in 2 weeks. 50% of 30  min visit spent on counseling and coordination of care.     Verneice Caspers 05/20/2016

## 2016-05-20 NOTE — Progress Notes (Signed)
Diabetes Education: 05/20/16 Kimberly Reid is a young Spanish woman who speaks fluent [place].  Accompanied by her Mom.  Newly diagnosed with GDM.  Currently at [redacted]w[redacted]d.  EDD 07/04/16.  Mother has type 2 DM.  Review of GDM and implications for mother and for baby. Review of the post-delivery self-management measures for preventing development of type 2 diabetes later in life. Review of factors effecting blood glucose levels. Provided handout "Nutrition, Diabetes, and Pregnancy" in Spanish. Recommended walking 30 minutes daily in cooler part of the day. Review of carbohydrates, carb counting and the CHO  recommendations for meals and snacks. Review of blood glucose monitoring process. Instructed to monitor fasting and 2 hr post meal blood glucose levels.   Instructed to record blood glucose readings and to bring blood glucose meter and log to all clinic appointments. Completed a return demonstration of glucose monitoring and her random glucose was 101 mg/dl.   Maggie Meighan Treto, RN, RD, LDN

## 2016-05-20 NOTE — Addendum Note (Signed)
Addended by: Kathee Delton on: 05/20/2016 03:17 PM   Modules accepted: Orders

## 2016-05-20 NOTE — Addendum Note (Signed)
Addended by: Darrel Hoover on: 05/20/2016 05:46 PM   Modules accepted: Orders

## 2016-05-21 ENCOUNTER — Ambulatory Visit: Payer: Self-pay

## 2016-05-22 ENCOUNTER — Telehealth: Payer: Self-pay

## 2016-05-22 NOTE — Telephone Encounter (Signed)
Patient has been scheduled for children cardiology on 05/30/2016 at 9:00 in [place]. She is aware of the appt and address

## 2016-05-29 ENCOUNTER — Encounter: Payer: Self-pay | Attending: Gynecology | Admitting: Nutrition

## 2016-05-29 DIAGNOSIS — O24919 Unspecified diabetes mellitus in pregnancy, unspecified trimester: Secondary | ICD-10-CM | POA: Insufficient documentation

## 2016-05-29 DIAGNOSIS — O24419 Gestational diabetes mellitus in pregnancy, unspecified control: Secondary | ICD-10-CM

## 2016-05-29 DIAGNOSIS — Z713 Dietary counseling and surveillance: Secondary | ICD-10-CM | POA: Insufficient documentation

## 2016-05-29 NOTE — Patient Instructions (Signed)
Continue to walk/exercise 5 days/wk, for 1 hour Continue to test blood sugars before breakfast and 2 hours after all meals. Do not drink sweet drinks, fruit juices, or eat cold cereal and milk. Call if morning blood sugars go over 95, or if 2hr. After meals go over 140.

## 2016-05-29 NOTE — Assessment & Plan Note (Addendum)
Patient is here today to review diet and blood sugar readings. Diet:  8AM: 1 package of Herbalite--Website does not list carbs for this.  Does have 21 grams of protein.  She is mixing this with water 10AM: 4 small cookies:  20 grams of carbs and water, or large apple (25 grams) 3PM: lunch 3-4 ounces meat 40 grams of carbs  5PM: protein bar (20 grams of carbs), or 4 cookies (20 grams) 7-7:30 same as lunch but smaller quantity.  35 carbs 9:30PM: occasional glass of 1% milk (8 ounces-12 grams of carbs)  SBGM:  Last week:  FBSs 74-89, 2hr. PcB: all less than 100, cL: 1 reading 124, all other less than 105,   Exercise:  Walks for 1 hour 2-3 days/wk, and does Zumba for 1 hour 2X/wk.   I encouraged her to continue the exercise and make sure she does this 5 days/wk.   Did not make any changes in her diet, since readings are so good.  She will contact me if FBSs go over 95, or if 2hr.  Readings go over 140 on a consisitent basis.  I will see her in 3 week, if not sooner.

## 2016-06-03 ENCOUNTER — Encounter: Payer: Self-pay | Admitting: Family Medicine

## 2016-06-03 ENCOUNTER — Ambulatory Visit (INDEPENDENT_AMBULATORY_CARE_PROVIDER_SITE_OTHER): Payer: Self-pay | Admitting: Obstetrics & Gynecology

## 2016-06-03 VITALS — BP 115/62 | HR 80 | Wt 214.4 lb

## 2016-06-03 DIAGNOSIS — O0993 Supervision of high risk pregnancy, unspecified, third trimester: Secondary | ICD-10-CM

## 2016-06-03 DIAGNOSIS — O24419 Gestational diabetes mellitus in pregnancy, unspecified control: Secondary | ICD-10-CM

## 2016-06-03 DIAGNOSIS — O24913 Unspecified diabetes mellitus in pregnancy, third trimester: Secondary | ICD-10-CM

## 2016-06-03 LAB — POCT URINALYSIS DIP (DEVICE)
Bilirubin Urine: NEGATIVE
Glucose, UA: NEGATIVE mg/dL
Hgb urine dipstick: NEGATIVE
KETONES UR: NEGATIVE mg/dL
Nitrite: NEGATIVE
PH: 5.5 (ref 5.0–8.0)
Protein, ur: NEGATIVE mg/dL
SPECIFIC GRAVITY, URINE: 1.02 (ref 1.005–1.030)
Urobilinogen, UA: 0.2 mg/dL (ref 0.0–1.0)

## 2016-06-03 NOTE — Patient Instructions (Signed)
Regrese a la clinica cuando tenga su cita. Si tiene problemas o preguntas, llama a la clinica o vaya a la sala de emergencia al [facility].    

## 2016-06-03 NOTE — Progress Notes (Signed)
Subjective:  Kimberly Reid is a 25 y.o. G1P0000 at 2025w5d being seen today for ongoing prenatal care.  She is currently monitored for the following issues for this high-risk pregnancy and has PCOS (polycystic ovarian syndrome); Insulin resistance; History of repair of coarctation of aorta; Supervision of high-risk pregnancy; URI (upper respiratory infection); and Gestational diabetes mellitus (GDM) affecting pregnancy, antepartum (HCC) on her problem list. Patient is Spanish-speaking only, Spanish interpreter present for this encounter.  Patient reports no complaints.  Contractions: Not present. Vag. Bleeding: None.  Movement: Present. Denies leaking of fluid.   The following portions of the patient's history were reviewed and updated as appropriate: allergies, current medications, past family history, past medical history, past social history, past surgical history and problem list. Problem list updated.  Objective:   Vitals:   06/03/16 1148  BP: 115/62  Pulse: 80  Weight: 214 lb 6.4 oz (97.3 kg)    Fetal Status: Fetal Heart Rate (bpm): 155 Fundal Height: 34 cm Movement: Present     General:  Alert, oriented and cooperative. Patient is in no acute distress.  Skin: Skin is warm and dry. No rash noted.   Cardiovascular: Normal heart rate noted  Respiratory: Normal respiratory effort, no problems with respiration noted  Abdomen: Soft, gravid, appropriate for gestational age. Pain/Pressure: Absent     Pelvic:  Cervical exam deferred        Extremities: Normal range of motion.  Edema: None  Mental Status: Normal mood and affect. Normal behavior. Normal judgment and thought content.   Urinalysis: Urine Protein: Negative Urine Glucose: Negative  Assessment and Plan:  Pregnancy: G1P0000 at 2025w5d  1. Gestational diabetes mellitus (GDM) affecting pregnancy, antepartum (HCC) CBGs are within range. Continue diet and exercise. Growth scan at 38 weeks.  2. Supervision of high-risk  pregnancy, third trimester Preterm labor symptoms and general obstetric precautions including but not limited to vaginal bleeding, contractions, leaking of fluid and fetal movement were reviewed in detail with the patient. Please refer to After Visit Summary for other counseling recommendations.  Return in about 2 weeks (around 06/17/2016) for OB Visit.   Tereso NewcomerUgonna A Ann-Marie Kluge, MD

## 2016-06-04 ENCOUNTER — Ambulatory Visit: Payer: Self-pay | Attending: Internal Medicine

## 2016-06-17 ENCOUNTER — Encounter: Payer: Self-pay | Admitting: Obstetrics and Gynecology

## 2016-06-24 ENCOUNTER — Ambulatory Visit (INDEPENDENT_AMBULATORY_CARE_PROVIDER_SITE_OTHER): Payer: Self-pay | Admitting: Obstetrics and Gynecology

## 2016-06-24 VITALS — BP 120/67 | HR 69 | Wt 218.4 lb

## 2016-06-24 DIAGNOSIS — O24919 Unspecified diabetes mellitus in pregnancy, unspecified trimester: Secondary | ICD-10-CM

## 2016-06-24 DIAGNOSIS — O0993 Supervision of high risk pregnancy, unspecified, third trimester: Secondary | ICD-10-CM

## 2016-06-24 DIAGNOSIS — O24419 Gestational diabetes mellitus in pregnancy, unspecified control: Secondary | ICD-10-CM

## 2016-06-24 LAB — POCT URINALYSIS DIP (DEVICE)
Bilirubin Urine: NEGATIVE
Bilirubin Urine: NEGATIVE
GLUCOSE, UA: NEGATIVE mg/dL
Glucose, UA: NEGATIVE mg/dL
HGB URINE DIPSTICK: NEGATIVE
Ketones, ur: NEGATIVE mg/dL
LEUKOCYTES UA: NEGATIVE
Leukocytes, UA: NEGATIVE
Nitrite: NEGATIVE
Nitrite: NEGATIVE
PROTEIN: NEGATIVE mg/dL
Protein, ur: NEGATIVE mg/dL
SPECIFIC GRAVITY, URINE: 1.02 (ref 1.005–1.030)
SPECIFIC GRAVITY, URINE: 1.025 (ref 1.005–1.030)
UROBILINOGEN UA: 1 mg/dL (ref 0.0–1.0)
Urobilinogen, UA: 1 mg/dL (ref 0.0–1.0)
pH: 6 (ref 5.0–8.0)
pH: 6 (ref 5.0–8.0)

## 2016-06-24 LAB — OB RESULTS CONSOLE GC/CHLAMYDIA: Gonorrhea: NEGATIVE

## 2016-06-24 LAB — OB RESULTS CONSOLE GBS: STREP GROUP B AG: NEGATIVE

## 2016-06-24 NOTE — Progress Notes (Signed)
Subjective:  Kimberly Reid is a 25 y.o. G1P0000 at 5766w5d being seen today for ongoing prenatal care.  She is currently monitored for the following issues for this high-risk pregnancy and has PCOS (polycystic ovarian syndrome); Insulin resistance; History of repair of coarctation of aorta; Pregnancy, supervision, high-risk; and Gestational diabetes mellitus (GDM) affecting pregnancy, antepartum (HCC) on her problem list.  Patient reports no complaints.  Contractions: Not present. Vag. Bleeding: None.  Movement: Present. Denies leaking of fluid.   The following portions of the patient's history were reviewed and updated as appropriate: allergies, current medications, past family history, past medical history, past social history, past surgical history and problem list. Problem list updated.  Objective:   Vitals:   06/24/16 1349  BP: 120/67  Pulse: 69  Weight: 218 lb 6.4 oz (99.1 kg)    Fetal Status: Fetal Heart Rate (bpm): 135   Movement: Present     General:  Alert, oriented and cooperative. Patient is in no acute distress.  Skin: Skin is warm and dry. No rash noted.   Cardiovascular: Normal heart rate noted  Respiratory: Normal respiratory effort, no problems with respiration noted  Abdomen: Soft, gravid, appropriate for gestational age. Pain/Pressure: Present     Pelvic:  Cervical exam performed        Extremities: Normal range of motion.  Edema: None  Mental Status: Normal mood and affect. Normal behavior. Normal judgment and thought content.   Urinalysis:      Assessment and Plan:  Pregnancy: G1P0000 at 8066w5d  1. Pregnancy, supervision, high-risk, third trimester - Culture, beta strep (group b only) - GC[facility]Chlamydia probe amp [facility]  2. Gestational diabetes mellitus (GDM) affecting pregnancy, antepartum (HCC) Stable. Growth U/S at 38 weeks  Preterm labor symptoms and general obstetric precautions including but not limited to vaginal bleeding,  contractions, leaking of fluid and fetal movement were reviewed in detail with the patient. Please refer to After Visit Summary for other counseling recommendations.  Return in about 1 week (around 07/01/2016) for OB visit.   Hermina StaggersMichael L Atira Borello, MD

## 2016-06-24 NOTE — Patient Instructions (Signed)
Tercer trimestre de embarazo (Third Trimester of Pregnancy) El tercer trimestre comprende desde la semana29 hasta la semana42, es decir, desde el mes7 hasta el mes9. El tercer trimestre es un perodo en el que el feto crece rpidamente. Hacia el final del noveno mes, el feto mide alrededor de 20pulgadas (45cm) de largo y pesa entre 6 y 10 libras (2,700 y 4,500kg).  CAMBIOS EN EL ORGANISMO Su organismo atraviesa por muchos cambios durante el embarazo, y estos varan de una mujer a otra.   Seguir aumentando de peso. Es de esperar que aumente entre 25 y 35libras (11 y 16kg) hacia el final del embarazo.  Podrn aparecer las primeras estras en las caderas, el abdomen y las mamas.  Puede tener necesidad de orinar con ms frecuencia porque el feto baja hacia la pelvis y ejerce presin sobre la vejiga.  Debido al embarazo podr sentir acidez estomacal con frecuencia.  Puede estar estreida, ya que ciertas hormonas enlentecen los movimientos de los msculos que empujan los desechos a travs de los intestinos.  Pueden aparecer hemorroides o abultarse e hincharse las venas (venas varicosas).  Puede sentir dolor plvico debido al aumento de peso y a que las hormonas del embarazo relajan las articulaciones entre los huesos de la pelvis. El dolor de espalda puede ser consecuencia de la sobrecarga de los msculos que soportan la postura.  Tal vez haya cambios en el cabello que pueden incluir su engrosamiento, crecimiento rpido y cambios en la textura. Adems, a algunas mujeres se les cae el cabello durante o despus del embarazo, o tienen el cabello seco o fino. Lo ms probable es que el cabello se le normalice despus del nacimiento del beb.  Las mamas seguirn creciendo y le dolern. A veces, puede haber una secrecin amarilla de las mamas llamada calostro.  El ombligo puede salir hacia afuera.  Puede sentir que le falta el aire debido a que se expande el tero.  Puede notar que el feto  "baja" o lo siente ms bajo, en el abdomen.  Puede tener una prdida de secrecin mucosa con sangre. Esto suele ocurrir en el trmino de unos pocos das a una semana antes de que comience el trabajo de parto.  El cuello del tero se vuelve delgado y blando (se borra) cerca de la fecha de parto. QU DEBE ESPERAR EN LOS EXMENES PRENATALES  Le harn exmenes prenatales cada 2semanas hasta la semana36. A partir de ese momento le harn exmenes semanales. Durante una visita prenatal de rutina:  La pesarn para asegurarse de que usted y el feto estn creciendo normalmente.  Le tomarn la presin arterial.  Le medirn el abdomen para controlar el desarrollo del beb.  Se escucharn los latidos cardacos fetales.  Se evaluarn los resultados de los estudios solicitados en visitas anteriores.  Le revisarn el cuello del tero cuando est prxima la fecha de parto para controlar si este se ha borrado. Alrededor de la semana36, el mdico le revisar el cuello del tero. Al mismo tiempo, realizar un anlisis de las secreciones del tejido vaginal. Este examen es para determinar si hay un tipo de bacteria, estreptococo Grupo B. El mdico le explicar esto con ms detalle. El mdico puede preguntarle lo siguiente:  Cmo le gustara que fuera el parto.  Cmo se siente.  Si siente los movimientos del beb.  Si ha tenido sntomas anormales, como prdida de lquido, sangrado, dolores de cabeza intensos o clicos abdominales.  Si est consumiendo algn producto que contenga tabaco, como cigarrillos, tabaco   de mascar y cigarrillos electrnicos.  Si tiene alguna pregunta. Otros exmenes o estudios de deteccin que pueden realizarse durante el tercer trimestre incluyen lo siguiente:  Anlisis de sangre para controlar los niveles de hierro (anemia).  Controles fetales para determinar su salud, nivel de actividad y crecimiento. Si tiene alguna enfermedad o hay problemas durante el embarazo, le harn  estudios.  Prueba del VIH (virus de inmunodeficiencia humana). Si corre un riesgo alto, pueden realizarle una prueba de deteccin del VIH durante el tercer trimestre del embarazo. FALSO TRABAJO DE PARTO Es posible que sienta contracciones leves e irregulares que finalmente desaparecen. Se llaman contracciones de Braxton Hicks o falso trabajo de parto. Las contracciones pueden durar horas, das o incluso semanas, antes de que el verdadero trabajo de parto se inicie. Si las contracciones ocurren a intervalos regulares, se intensifican o se hacen dolorosas, lo mejor es que la revise el mdico.  SIGNOS DE TRABAJO DE PARTO   Clicos de tipo menstrual.  Contracciones cada 5minutos o menos.  Contracciones que comienzan en la parte superior del tero y se extienden hacia abajo, a la zona inferior del abdomen y la espalda.  Sensacin de mayor presin en la pelvis o dolor de espalda.  Una secrecin de mucosidad acuosa o con sangre que sale de la vagina. Si tiene alguno de estos signos antes de la semana37 del embarazo, llame a su mdico de inmediato. Debe concurrir al hospital para que la controlen inmediatamente. INSTRUCCIONES PARA EL CUIDADO EN EL HOGAR   Evite fumar, consumir hierbas, beber alcohol y tomar frmacos que no le hayan recetado. Estas sustancias qumicas afectan la formacin y el desarrollo del beb.  No consuma ningn producto que contenga tabaco, lo que incluye cigarrillos, tabaco de mascar y cigarrillos electrnicos. Si necesita ayuda para dejar de fumar, consulte al mdico. Puede recibir asesoramiento y otro tipo de recursos para dejar de fumar.  Siga las indicaciones del mdico en relacin con el uso de medicamentos. Durante el embarazo, hay medicamentos que son seguros de tomar y otros que no.  Haga ejercicio solamente como se lo haya indicado el mdico. Sentir clicos uterinos es un buen signo para detener la actividad fsica.  Contine comiendo alimentos sanos con  regularidad.  Use un sostn que le brinde buen soporte si le duelen las mamas.  No se d baos de inmersin en agua caliente, baos turcos ni saunas.  Use el cinturn de seguridad en todo momento mientras conduce.  No coma carne cruda ni queso sin cocinar; evite el contacto con las bandejas sanitarias de los gatos y la tierra que estos animales usan. Estos elementos contienen grmenes que pueden causar defectos congnitos en el beb.  Tome las vitaminas prenatales.  Tome entre 1500 y 2000mg de calcio diariamente comenzando en la semana20 del embarazo hasta el parto.  Si est estreida, pruebe un laxante suave (si el mdico lo autoriza). Consuma ms alimentos ricos en fibra, como vegetales y frutas frescos y cereales integrales. Beba gran cantidad de lquido para mantener la orina de tono claro o color amarillo plido.  Dese baos de asiento con agua tibia para aliviar el dolor o las molestias causadas por las hemorroides. Use una crema para las hemorroides si el mdico la autoriza.  Si tiene venas varicosas, use medias de descanso. Eleve los pies durante 15minutos, 3 o 4veces por da. Limite el consumo de sal en su dieta.  Evite levantar objetos pesados, use zapatos de tacones bajos y mantenga una buena postura.  Descanse   con las piernas elevadas si tiene calambres o dolor de cintura.  Visite a su dentista si no lo ha hecho durante el embarazo. Use un cepillo de dientes blando para higienizarse los dientes y psese el hilo dental con suavidad.  Puede seguir manteniendo relaciones sexuales, a menos que el mdico le indique lo contrario.  No haga viajes largos excepto que sea absolutamente necesario y solo con la autorizacin del mdico.  Tome clases prenatales para entender, practicar y hacer preguntas sobre el trabajo de parto y el parto.  Haga un ensayo de la partida al hospital.  Prepare el bolso que llevar al hospital.  Prepare la habitacin del beb.  Concurra a todas  las visitas prenatales segn las indicaciones de su mdico. SOLICITE ATENCIN MDICA SI:  No est segura de que est en trabajo de parto o de que ha roto la bolsa de las aguas.  Tiene mareos.  Siente clicos leves, presin en la pelvis o dolor persistente en el abdomen.  Tiene nuseas, vmitos o diarrea persistentes.  Observa una secrecin vaginal con mal olor.  Siente dolor al orinar. SOLICITE ATENCIN MDICA DE INMEDIATO SI:   Tiene fiebre.  Tiene una prdida de lquido por la vagina.  Tiene sangrado o pequeas prdidas vaginales.  Siente dolor intenso o clicos en el abdomen.  Sube o baja de peso rpidamente.  Tiene dificultad para respirar y siente dolor de pecho.  Sbitamente se le hinchan mucho el rostro, las manos, los tobillos, los pies o las piernas.  No ha sentido los movimientos del beb durante una hora.  Siente un dolor de cabeza intenso que no se alivia con medicamentos.  Su visin se modifica.   Esta informacin no tiene como fin reemplazar el consejo del mdico. Asegrese de hacerle al mdico cualquier pregunta que tenga.   Document Released: 07/24/2005 Document Revised: 11/04/2014 Elsevier Interactive Patient Education 2016 Elsevier Inc.  

## 2016-06-24 NOTE — Progress Notes (Signed)
Video Interpreter # [id number] 36 wk labs today

## 2016-06-25 LAB — GC/CHLAMYDIA PROBE AMP (~~LOC~~) NOT AT ARMC
Chlamydia: NEGATIVE
Neisseria Gonorrhea: NEGATIVE

## 2016-06-26 ENCOUNTER — Encounter: Payer: Self-pay | Admitting: Nutrition

## 2016-06-26 LAB — CULTURE, BETA STREP (GROUP B ONLY)

## 2016-07-04 ENCOUNTER — Ambulatory Visit (INDEPENDENT_AMBULATORY_CARE_PROVIDER_SITE_OTHER): Payer: Self-pay | Admitting: Obstetrics and Gynecology

## 2016-07-04 VITALS — BP 121/64 | HR 87 | Wt 220.6 lb

## 2016-07-04 DIAGNOSIS — O24419 Gestational diabetes mellitus in pregnancy, unspecified control: Secondary | ICD-10-CM

## 2016-07-04 DIAGNOSIS — Z3A37 37 weeks gestation of pregnancy: Secondary | ICD-10-CM

## 2016-07-04 DIAGNOSIS — O0993 Supervision of high risk pregnancy, unspecified, third trimester: Secondary | ICD-10-CM

## 2016-07-04 LAB — POCT URINALYSIS DIP (DEVICE)
Bilirubin Urine: NEGATIVE
Glucose, UA: NEGATIVE mg/dL
HGB URINE DIPSTICK: NEGATIVE
Ketones, ur: NEGATIVE mg/dL
Nitrite: NEGATIVE
Protein, ur: NEGATIVE mg/dL
SPECIFIC GRAVITY, URINE: 1.01 (ref 1.005–1.030)
UROBILINOGEN UA: 0.2 mg/dL (ref 0.0–1.0)
pH: 7 (ref 5.0–8.0)

## 2016-07-04 NOTE — Progress Notes (Signed)
   PRENATAL VISIT NOTE  Subjective:  Kimberly Reid is a 25 y.o. G1P0000 at 5264w1d being seen today for ongoing prenatal care.  She is currently monitored for the following issues for this high-risk pregnancy and has PCOS (polycystic ovarian syndrome); Insulin resistance; History of repair of coarctation of aorta; Pregnancy, supervision, high-risk; and Gestational diabetes mellitus (GDM) affecting pregnancy, antepartum (HCC) on her problem list.  Patient reports no complaints.  Contractions: Not present. Vag. Bleeding: None.  Movement: Present. Denies leaking of fluid.   Pt reprots max am sugar is 94 but most under 90 and mx post prandial sugar 124 but most under 120.  The following portions of the patient's history were reviewed and updated as appropriate: allergies, current medications, past family history, past medical history, past social history, past surgical history and problem list. Problem list updated.  Objective:   Vitals:   07/04/16 1426  BP: 121/64  Pulse: 87  Weight: 220 lb 9.6 oz (100.1 kg)    Fetal Status: Fetal Heart Rate (bpm): 166   Movement: Present     General:  Alert, oriented and cooperative. Patient is in no acute distress.  Skin: Skin is warm and dry. No rash noted.   Cardiovascular: Normal heart rate noted  Respiratory: Normal respiratory effort, no problems with respiration noted  Abdomen: Soft, gravid, appropriate for gestational age. Pain/Pressure: Present     Pelvic:  Cervical exam deferred        Extremities: Normal range of motion.  Edema: None  Mental Status: Normal mood and affect. Normal behavior. Normal judgment and thought content.   Urinalysis: Urine Protein: Negative Urine Glucose: Negative  Assessment and Plan:  Pregnancy: G1P0000 at 8164w1d  1. Gestational diabetes mellitus (GDM) affecting pregnancy Pt due for growth US at 38wks. Ordered today.  - US MFM OB FOLLOW UP; Future -discussed with pt that we will likely induce at 40wks if  has not had child by that point.   2. [redacted] weeks gestation of pregnancy No concerns - US MFM OB FOLLOW UP; Future  Term labor symptoms and general obstetric precautions including but not limited to vaginal bleeding, contractions, leaking of fluid and fetal movement were reviewed in detail with the patient. Please refer to After Visit Summary for other counseling recommendations.  Return in about 1 week (around 07/11/2016) for HROB.  Lorne SkeensNicholas Michael Bresha Hosack, MD

## 2016-07-04 NOTE — Patient Instructions (Signed)
Induccin del trabajo de parto  (Labor Induction) Se denomina induccin del trabajo de parto cuando se inician acciones para hacer que una mujer embarazada comience el trabajo de parto. La mayora de las mujeres comienzan el trabajo de parto sin ayuda entre las semanas 37 y 42 del embarazo. Cuando esto no ocurre o cuando hay una necesidad mdica, pueden utilizarse diferentes mtodos para inducirlo. La induccin del trabajo de parto hace que el tero se contraiga. Tambin hace que el cuello del tero se ablandemadure), se abra (se dilate), y se afine (se borre). Generalmente el trabajo de parto no se induce antes de las 39 semanas excepto que haya un problema con el beb o con la madre.  Antes de inducir el trabajo de parto, el mdico considerar cierto nmero de factores incluyendo los siguientes:  El estado del beb.  Cuntas semanas tiene de embarazo.  La madurez de los pulmones del beb.  El estado del cuello del tero.  La posicin del beb. CULES SON LOS MOTIVOS PARA INDUCIR UN PARTO? El trabajo de parto puede inducirse por las siguientes razones:  La salud del beb o de la madre estn en riesgo.  El embarazo se ha pasado de trmino en 1 semana o ms.  Ha roto la bolsa de aguas pero no se ha iniciado el trabajo de parto por s mismo.  La madre tiene algn trastorno de salud o una enfermedad grave, como hipertensin arterial, una infeccin, desprendimiento abrupto de la placenta o diabetes.  Hay escaso lquido amnitico alrededor del beb.  El beb presenta sufrimiento. La conveniencia o el deseo de que el beb nazca en una cierta fecha no es un motivo para inducir el parto. CULES SON LOS MTODOS UTILIZADOS PARA INDUCIR EL TRABAJO DE PARTO? Algunos mtodos de induccin del trabajo de parto son:   Administracin del medicamentos prostaglandina. Este medicamento hace que el cuello uterino se dilate y madure. Este medicamento tambin iniciar las contracciones. Puede tomarse por  boca o insertarse en la vagina en forma de supositorio.  Insercin en la vagina de un tubo delgado (catter) con un baln en el extremo para dilatar el cuello del tero. Una vez insertado, el baln se infla con agua, lo que provoca la apertura del cuello del tero.  Ruptura de las membranas. El mdico separa el saco amnitico del cuello uterino, haciendo que el cuello uterino se distienda y cause la liberacin de la hormona llamada progesterona. Esto hace que el tero se contraiga. Este procedimiento se realiza durante una visita al consultorio mdico. Le indicarn que vuelva a su casa y espere que se inicien las contracciones. Luego tendr que volver para la induccin.  Ruptura de la bolsa de aguas. El mdico romper el saco amnitico con un pequeo instrumento. Una vez que el saco amnitico se rompe, las contracciones deben comenzar. Pueden pasar algunas horas hasta que haga efecto.  Medicamentos que desencadenen o intensifiquen las contracciones. Se lo administrarn a travs de un catter por va intravenosa (IV) que se inserta en una de las venas del brazo. Todos los mtodos de induccin, excepto la ruptura de membranas, se realizan en el hospital. La induccin se realizar en el hospital, de modo que usted y el beb puedan ser controlados cuidadosamente.  CUNTO TIEMPO LLEVA INDUCIR EL TRABAJO DE PARTO? Algunas inducciones pueden demorar entre 2 y 3 das. Generalmente lleva menos tiempo, dependiendo del estado del cuello del tero. Puede tomar ms tiempo si la induccin se realiza en etapas tempranas del embarazo o   es su primer embarazo. Si han pasado 2 o 3 das y no se inicia el trabajo de parto, podrn enviarla a su casa o realizar una cesrea. CULES SON LOS RIESGOS ASOCIADOS CON LA INDUCCiN DEL TRABAJO DE PARTO? Algunos de los riesgos de la induccin son:   Cambios en la frecuencia cardaca fetal, por ejemplo los latidos son demasiado rpidos, o lentos, o errticos.  Riesgo de distrs  fetal.  Posibilidad de infeccin en la madre o el beb.  Aumento de la posibilidad de que sea necesaria una cesrea.  Ruptura (abrupcin) de la placenta del tero (raro).  Ruptura uterina (muy raro). Cuando es necesario realizar la induccin por razones mdicas, los beneficios deben superar a los riesgos. CULES SON ALGUNAS RAZONES PARA NO INDUCIR EL TRABAJO DE PARTO? La induccin no debe realizarse si:   Se demuestra que el beb no tolera el trabajo de parto.  Fue sometida anteriormente a cirugas en el tero, como una miomectoma o le han extirpado fibromas.  La placenta est en una posicin muy baja en el tero y obstruye la abertura del cuello (placenta previa).  El beb no est ubicado con la cabeza hacia bajo.  El cordn umbilical cae hacia el canal de parto, adelante del beb. Esto puede cortar el suministro de sangre y oxgeno al beb.  Fue sometida a una cesrea anteriormente.  Hay circunstancias poco habituales, como que el beb es extremadamente prematuro.   Esta informacin no tiene como fin reemplazar el consejo del mdico. Asegrese de hacerle al mdico cualquier pregunta que tenga.   Document Released: 01/21/2008 Document Revised: 11/04/2014 Elsevier Interactive Patient Education 2016 Elsevier Inc.  

## 2016-07-09 ENCOUNTER — Encounter: Payer: Self-pay | Admitting: Nutrition

## 2016-07-11 ENCOUNTER — Encounter (HOSPITAL_COMMUNITY): Payer: Self-pay

## 2016-07-11 ENCOUNTER — Ambulatory Visit (HOSPITAL_COMMUNITY)
Admission: RE | Admit: 2016-07-11 | Discharge: 2016-07-11 | Disposition: A | Discharge status: Home / Self Care | Payer: Self-pay | Source: Ambulatory Visit | Attending: Obstetrics and Gynecology | Admitting: Obstetrics and Gynecology

## 2016-07-11 DIAGNOSIS — Z3A37 37 weeks gestation of pregnancy: Secondary | ICD-10-CM

## 2016-07-11 DIAGNOSIS — O24419 Gestational diabetes mellitus in pregnancy, unspecified control: Secondary | ICD-10-CM

## 2016-07-12 ENCOUNTER — Ambulatory Visit (INDEPENDENT_AMBULATORY_CARE_PROVIDER_SITE_OTHER): Payer: Self-pay | Admitting: Obstetrics and Gynecology

## 2016-07-12 VITALS — BP 131/67 | HR 87 | Wt 224.5 lb

## 2016-07-12 DIAGNOSIS — O24419 Gestational diabetes mellitus in pregnancy, unspecified control: Secondary | ICD-10-CM

## 2016-07-12 DIAGNOSIS — O24913 Unspecified diabetes mellitus in pregnancy, third trimester: Secondary | ICD-10-CM

## 2016-07-12 DIAGNOSIS — E282 Polycystic ovarian syndrome: Secondary | ICD-10-CM

## 2016-07-12 DIAGNOSIS — O34219 Maternal care for unspecified type scar from previous cesarean delivery: Secondary | ICD-10-CM

## 2016-07-12 DIAGNOSIS — Z8774 Personal history of (corrected) congenital malformations of heart and circulatory system: Secondary | ICD-10-CM

## 2016-07-12 DIAGNOSIS — IMO0002 Reserved for concepts with insufficient information to code with codable children: Secondary | ICD-10-CM

## 2016-07-12 DIAGNOSIS — O24919 Unspecified diabetes mellitus in pregnancy, unspecified trimester: Secondary | ICD-10-CM

## 2016-07-12 DIAGNOSIS — O0993 Supervision of high risk pregnancy, unspecified, third trimester: Secondary | ICD-10-CM

## 2016-07-12 LAB — POCT URINALYSIS DIP (DEVICE)
BILIRUBIN URINE: NEGATIVE
GLUCOSE, UA: NEGATIVE mg/dL
NITRITE: NEGATIVE
Protein, ur: 30 mg/dL — AB
Specific Gravity, Urine: 1.02 (ref 1.005–1.030)
Urobilinogen, UA: 1 mg/dL (ref 0.0–1.0)
pH: 6.5 (ref 5.0–8.0)

## 2016-07-13 NOTE — Progress Notes (Signed)
Prenatal Visit Note Date: 07/13/2016 Clinic: [facility]  Subjective:  Kimberly Reid is a 25 y.o. G1P0000 at 1022w6d being seen today for ongoing prenatal care.  She is currently monitored for the following issues for this high-risk pregnancy and has PCOS (polycystic ovarian syndrome); Insulin resistance; History of repair of coarctation of aorta; Pregnancy, supervision, high-risk; Gestational diabetes mellitus (GDM) affecting pregnancy, antepartum (HCC); and LGA (large for gestational age) fetus on her problem list.  Patient reports no complaints.   Contractions: Not present.  .  Movement: Present. Denies leaking of fluid.   The following portions of the patient's history were reviewed and updated as appropriate: allergies, current medications, past family history, past medical history, past social history, past surgical history and problem list. Problem list updated.  Objective:   Vitals:   07/12/16 1058  BP: 131/67  Pulse: 87  Weight: 101.8 kg (224 lb 8 oz)    Fetal Status: Fetal Heart Rate (bpm): 135   Movement: Present     General:  Alert, oriented and cooperative. Patient is in no acute distress.  Skin: Skin is warm and dry. No rash noted.   Cardiovascular: Normal heart rate noted  Respiratory: Normal respiratory effort, no problems with respiration noted  Abdomen: Soft, gravid, appropriate for gestational age. Pain/Pressure: Present     Pelvic:  Cervical exam deferred        Extremities: Normal range of motion.  Edema: None  Mental Status: Normal mood and affect. Normal behavior. Normal judgment and thought content.   Urinalysis:      Assessment and Plan:  Pregnancy: G1P0000 at 7522w6d  1. LGA (large for gestational age) fetus See below  2. Pregnancy, supervision, high-risk, third trimester Routine care. GBS negative  3. 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 No current issues  4. Gestational diabetes mellitus (GDM) affecting  pregnancy, antepartum (HCC) Normal BS log today. LGA fetus yesterday. D/w her re: close eye on labor curve and SD risk if she gets to the 2nd stage. Set up for IOL sometime in 39wks Continue with A1 control.    Term labor symptoms and general obstetric precautions including but not limited to vaginal bleeding, contractions, leaking of fluid and fetal movement were reviewed in detail with the patient. Please refer to After Visit Summary for other counseling recommendations.  Return for  6 weeks f/up.   [place] Bingharlie Shanita Kanan, MD

## 2016-07-14 ENCOUNTER — Inpatient Hospital Stay (HOSPITAL_COMMUNITY)
Admission: RE | Admit: 2016-07-14 | Discharge: 2016-07-18 | DRG: 775 | Disposition: A | Discharge status: Home / Self Care | Payer: Medicaid Other | Source: Ambulatory Visit | Attending: Family Medicine | Admitting: Family Medicine

## 2016-07-14 ENCOUNTER — Encounter (HOSPITAL_COMMUNITY): Payer: Self-pay

## 2016-07-14 DIAGNOSIS — Z3490 Encounter for supervision of normal pregnancy, unspecified, unspecified trimester: Secondary | ICD-10-CM

## 2016-07-14 DIAGNOSIS — Z833 Family history of diabetes mellitus: Secondary | ICD-10-CM

## 2016-07-14 DIAGNOSIS — Z6841 Body Mass Index (BMI) 40.0 and over, adult: Secondary | ICD-10-CM | POA: Diagnosis not present

## 2016-07-14 DIAGNOSIS — O24013 Pre-existing diabetes mellitus, type 1, in pregnancy, third trimester: Secondary | ICD-10-CM

## 2016-07-14 DIAGNOSIS — O99214 Obesity complicating childbirth: Secondary | ICD-10-CM | POA: Diagnosis present

## 2016-07-14 DIAGNOSIS — E109 Type 1 diabetes mellitus without complications: Secondary | ICD-10-CM

## 2016-07-14 DIAGNOSIS — Z3A39 39 weeks gestation of pregnancy: Secondary | ICD-10-CM

## 2016-07-14 DIAGNOSIS — O3663X0 Maternal care for excessive fetal growth, third trimester, not applicable or unspecified: Secondary | ICD-10-CM | POA: Diagnosis present

## 2016-07-14 DIAGNOSIS — O24420 Gestational diabetes mellitus in childbirth, diet controlled: Secondary | ICD-10-CM | POA: Diagnosis present

## 2016-07-14 DIAGNOSIS — O24419 Gestational diabetes mellitus in pregnancy, unspecified control: Secondary | ICD-10-CM | POA: Diagnosis present

## 2016-07-14 LAB — CBC
HCT: 36.6 % (ref 36.0–46.0)
Hemoglobin: 12.4 g/dL (ref 12.0–15.0)
MCH: 28 pg (ref 26.0–34.0)
MCHC: 33.9 g/dL (ref 30.0–36.0)
MCV: 82.6 fL (ref 78.0–100.0)
PLATELETS: 215 10*3/uL (ref 150–400)
RBC: 4.43 MIL/uL (ref 3.87–5.11)
RDW: 14.5 % (ref 11.5–15.5)
WBC: 6.9 10*3/uL (ref 4.0–10.5)

## 2016-07-14 LAB — GLUCOSE, CAPILLARY
GLUCOSE-CAPILLARY: 69 mg/dL (ref 65–99)
Glucose-Capillary: 74 mg/dL (ref 65–99)
Glucose-Capillary: 94 mg/dL (ref 65–99)
Glucose-Capillary: 101 mg/dL — ABNORMAL HIGH (ref 65–99)

## 2016-07-14 LAB — TYPE AND SCREEN
ABO/RH(D): O POS
Antibody Screen: NEGATIVE

## 2016-07-14 MED ORDER — OXYCODONE-ACETAMINOPHEN 5-325 MG PO TABS: 1.0000 | ORAL_TABLET | ORAL | Status: DC | PRN

## 2016-07-14 MED ORDER — FLEET ENEMA 7-19 GM/118ML RE ENEM: 1.0000 | ENEMA | RECTAL | Status: DC | PRN

## 2016-07-14 MED ORDER — OXYCODONE-ACETAMINOPHEN 5-325 MG PO TABS: 2.0000 | ORAL_TABLET | ORAL | Status: DC | PRN

## 2016-07-14 MED ORDER — MISOPROSTOL 25 MCG QUARTER TABLET
25.0000 ug | ORAL_TABLET | ORAL | Status: DC | PRN
  Administered 2016-07-14: 25 ug via VAGINAL
  Filled 2016-07-14: qty 0.25
  Filled 2016-07-14: qty 1
  Filled 2016-07-14: qty 0.25

## 2016-07-14 MED ORDER — LACTATED RINGERS IV SOLN
INTRAVENOUS | Status: DC
  Administered 2016-07-14 – 2016-07-15 (×3): via INTRAVENOUS

## 2016-07-14 MED ORDER — LACTATED RINGERS IV SOLN
500.0000 mL | INTRAVENOUS | Status: DC | PRN
Start: 2016-07-14 — End: 2016-07-16

## 2016-07-14 MED ORDER — LACTATED RINGERS IV SOLN: INTRAVENOUS | Status: DC

## 2016-07-14 MED ORDER — OXYTOCIN 40 UNITS IN LACTATED RINGERS INFUSION - SIMPLE MED
1.0000 m[IU]/min | INTRAVENOUS | Status: DC
  Administered 2016-07-15: 4 m[IU]/min via INTRAVENOUS
  Administered 2016-07-15: 2 m[IU]/min via INTRAVENOUS

## 2016-07-14 MED ORDER — ACETAMINOPHEN 325 MG PO TABS: 650.0000 mg | ORAL_TABLET | ORAL | Status: DC | PRN

## 2016-07-14 MED ORDER — LIDOCAINE HCL (PF) 1 % IJ SOLN: 30.0000 mL | INTRAMUSCULAR | Status: DC | PRN

## 2016-07-14 MED ORDER — LACTATED RINGERS IV SOLN: 500.0000 mL | INTRAVENOUS | Status: DC | PRN

## 2016-07-14 MED ORDER — SOD CITRATE-CITRIC ACID 500-334 MG/5ML PO SOLN: 30.0000 mL | ORAL | Status: DC | PRN

## 2016-07-14 MED ORDER — HYDROXYZINE HCL 50 MG PO TABS
50.0000 mg | ORAL_TABLET | Freq: Four times a day (QID) | ORAL | Status: DC | PRN
  Filled 2016-07-14: qty 1

## 2016-07-14 MED ORDER — LIDOCAINE HCL (PF) 1 % IJ SOLN
30.0000 mL | INTRAMUSCULAR | Status: DC | PRN
  Administered 2016-07-16: 30 mL via SUBCUTANEOUS
  Filled 2016-07-14: qty 30

## 2016-07-14 MED ORDER — OXYTOCIN 40 UNITS IN LACTATED RINGERS INFUSION - SIMPLE MED
2.5000 [IU]/h | INTRAVENOUS | Status: DC
Start: 2016-07-14 — End: 2016-07-17
  Filled 2016-07-14: qty 1000

## 2016-07-14 MED ORDER — ONDANSETRON HCL 4 MG/2ML IJ SOLN: 4.0000 mg | Freq: Four times a day (QID) | INTRAMUSCULAR | Status: DC | PRN

## 2016-07-14 MED ORDER — ONDANSETRON HCL 4 MG/2ML IJ SOLN
4.0000 mg | Freq: Four times a day (QID) | INTRAMUSCULAR | Status: DC | PRN
  Administered 2016-07-16: 4 mg via INTRAVENOUS
  Filled 2016-07-14: qty 2

## 2016-07-14 MED ORDER — OXYTOCIN BOLUS FROM INFUSION
500.0000 mL | Freq: Once | INTRAVENOUS | Status: AC
  Administered 2016-07-16: 400 mL via INTRAVENOUS

## 2016-07-14 MED ORDER — TERBUTALINE SULFATE 1 MG/ML IJ SOLN
0.2500 mg | Freq: Once | INTRAMUSCULAR | Status: DC | PRN
  Filled 2016-07-14: qty 1

## 2016-07-14 MED ORDER — FENTANYL CITRATE (PF) 100 MCG/2ML IJ SOLN: 50.0000 ug | INTRAMUSCULAR | Status: DC | PRN

## 2016-07-14 MED ORDER — OXYTOCIN 40 UNITS IN LACTATED RINGERS INFUSION - SIMPLE MED
2.5000 [IU]/h | INTRAVENOUS | Status: DC
  Filled 2016-07-14 (×3): qty 1000

## 2016-07-14 MED ORDER — OXYTOCIN BOLUS FROM INFUSION
500.0000 mL | Freq: Once | INTRAVENOUS | Status: AC
  Administered 2016-07-16: 500 mL via INTRAVENOUS

## 2016-07-14 NOTE — Progress Notes (Signed)
S: denies complaints O: FHR pattern reassuring, uc's q 2  And mild, SVE ft/th/post -2 foly bulb introduced thru cervix and inflated with 60 cc sterile fluid. Pt tolerated procedure well. A: IOL for GDM @ 39 wks, EFW 9-3 P: continue present cytotec

## 2016-07-14 NOTE — Anesthesia Pain Management Evaluation Note (Signed)
  [facility] Pain Management Visit Note  Patient: Kimberly Reid, 25 y.o., female  "Hello [facility] am a [facility]. We have an anesthesia team available at all times to provide care throughout the hospital, including epidural management and anesthesia for C-section. I don't know your plan for the delivery whether it a natural birth, water birth, IV sedation, nitrous supplementation, doula or epidural, but we want to meet your pain goals."   1.Was your pain managed to your expectations on prior hospitalizations?   No prior hospitalizations  2.What is your expectation for pain management during this hospitalization?     Epidural  3.How can we help you reach that goal? epidural  Record the patient's initial score and the patient's pain goal.   Pain: 0  Pain Goal: 6 [facility] wants you to be able to say your pain was always managed very well.  Kimberly Reid 07/14/2016

## 2016-07-14 NOTE — Progress Notes (Signed)
LABOR PROGRESS NOTE  Kimberly SpikesKaren Balderas Reid is a 25 y.o. G1P0000 at 4578w0d  admitted for IOL for A1DM.  Subjective: Patient doing well. Denies any concerns at this time.   Objective: Temp 98.6 F (37 C) (Oral)   Resp 20   Ht 5\' 2"  (1.575 m)   Wt 224 lb (101.6 kg)   LMP 08/31/2015   BMI 40.97 kg/m  or  Vitals:   07/14/16 1014 07/14/16 1621 07/14/16 1851  Resp:   20  Temp:  98.6 F (37 C)   TempSrc:  Oral   Weight: 224 lb (101.6 kg)    Height: 5\' 2"  (1.575 m)     NAD.  Dilation: 1 Effacement (%): 50 Station: -1 Presentation: Vertex Exam by:: Coca-[employer]aashley ellis student cnm   FHT: baseline rate 135, moderate variability, +acel, no decel  Labs: Lab Results  Component Value Date   WBC 6.9 07/14/2016   HGB 12.4 07/14/2016   HCT 36.6 07/14/2016   MCV 82.6 07/14/2016   PLT 215 07/14/2016    Patient Active Problem List   Diagnosis Date Noted  . Pregnant 07/14/2016  . Gestational diabetes mellitus (GDM) affecting pregnancy 07/14/2016  . LGA (large for gestational age) fetus 07/12/2016  . Gestational diabetes mellitus (GDM) affecting pregnancy, antepartum (HCC) 05/20/2016  . Pregnancy, supervision, high-risk 02/20/2016  . PCOS (polycystic ovarian syndrome) 05/26/2013  . 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 G1P0000 at 9678w0d here for IOL for A1DM  Labor: S/p FB placed at 1630. Will likely start Pit once FB out Fetal Wellbeing:  Category I Pain Control: supportive care at this time. May have epidural when in active labor if pt desires Anticipated MOD:  SVD  Frederik PearJulie P Rakeem Colley, MD 07/14/2016, 8:36 PM

## 2016-07-14 NOTE — H&P (Signed)
Kimberly SpikesKaren Balderas Reid is a 25 y.o. female  G1 @ 39.0 wks presenting for IOL for A1DM. Denies any VB or ROM. OB History    Gravida Para Term Preterm AB Living   1 0 0 0 0 0   SAB TAB Ectopic Multiple Live Births   0 0 0 0       Past Medical History:  Diagnosis Date  . 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 Diabetes mellitus without complication (HCC)   . Heart murmur   . Polycystic ovary syndrome 03/24/2013   Past Surgical History:  Procedure Laterality Date  . CARDIAC SURGERY     Family History: family history includes Diabetes in her mother. Social History:  reports that she is a non-smoker but has been exposed to tobacco smoke. She has never used smokeless tobacco. She reports that she drinks alcohol. She reports that she does not use drugs.     Maternal Diabetes: Yes:  Diabetes Type:  Diet controlled Genetic Screening: Normal Maternal Ultrasounds/Referrals: Normal Fetal Ultrasounds or other Referrals:  None Maternal Substance Abuse:  No Significant Maternal Medications:  None Significant Maternal Lab Results:  None Other Comments:  None  ROS Maternal Medical History:  Reason for admission: IOL for A1DM  Fetal activity: Perceived fetal activity is normal.   Last perceived fetal movement was within the past hour.    Prenatal complications: no prenatal complications Prenatal Complications - Diabetes: gestational. Diabetes is managed by diet.      Dilation: Fingertip Effacement (%): 50 Station: -1 Exam by:: Kimberly Reid student cnm Height 5\' 2"  (1.575 m), weight 224 lb (101.6 kg), last menstrual period 08/31/2015. Maternal Exam:  Abdomen: Patient reports no abdominal tenderness. Estimated fetal weight is 9-3.   Fetal presentation: vertex  Introitus: Normal vulva. Normal vagina.  Ferning test: not done.  Nitrazine test: not done. Amniotic fluid character: not assessed.  Pelvis: adequate for delivery.   Cervix: Cervix evaluated by digital exam.      Fetal Exam Fetal Monitor Review: Mode: ultrasound.   Variability: moderate (6-25 bpm).   Pattern: accelerations present and no decelerations.    Fetal State Assessment: Category I - tracings are normal.     Physical Exam  Constitutional: She is oriented to person, place, and time. She appears well-developed and well-nourished.  HENT:  Head: Normocephalic.  Eyes: Pupils are equal, round, and reactive to light.  Neck: Normal range of motion.  Cardiovascular: Normal rate and regular rhythm.   Respiratory: Effort normal and breath sounds normal.  GI: Soft. Bowel sounds are normal.  Genitourinary: Vagina normal and uterus normal.  Musculoskeletal: Normal range of motion.  Neurological: She is alert and oriented to person, place, and time. She has normal reflexes.  Skin: Skin is warm and dry.  Psychiatric: She has a normal mood and affect. Her behavior is normal. Judgment and thought content normal.    Prenatal labs: ABO, Rh: O/POS/-- (02/01 0930) Antibody: NEG (02/01 0930) Rubella: 5.43 (02/01 0930) RPR: NON REAC (06/22 1517)  HBsAg: NEGATIVE (02/01 0930)  HIV: NONREACTIVE (06/22 1517)  GBS: Negative (08/28 0000)   Assessment/Plan: Preg @ 39.0 wks, G1P0, IOL for A1DM. EFW 9lbs 3oz per U/S on 914/17, GBS neg.    [name] [name] 07/14/2016, 10:42 AM

## 2016-07-15 ENCOUNTER — Inpatient Hospital Stay (HOSPITAL_COMMUNITY): Payer: Medicaid Other | Admitting: Anesthesiology

## 2016-07-15 ENCOUNTER — Encounter (HOSPITAL_COMMUNITY): Payer: Self-pay

## 2016-07-15 LAB — GLUCOSE, CAPILLARY
GLUCOSE-CAPILLARY: 93 mg/dL (ref 65–99)
GLUCOSE-CAPILLARY: 85 mg/dL (ref 65–99)
GLUCOSE-CAPILLARY: 79 mg/dL (ref 65–99)
Glucose-Capillary: 104 mg/dL — ABNORMAL HIGH (ref 65–99)
Glucose-Capillary: 80 mg/dL (ref 65–99)
Glucose-Capillary: 77 mg/dL (ref 65–99)

## 2016-07-15 LAB — RPR: RPR: NONREACTIVE

## 2016-07-15 LAB — HIV ANTIBODY (ROUTINE TESTING W REFLEX): HIV Screen 4th Generation wRfx: NONREACTIVE

## 2016-07-15 MED ORDER — PHENYLEPHRINE 40 MCG/ML (10ML) SYRINGE FOR IV PUSH (FOR BLOOD PRESSURE SUPPORT)
PREFILLED_SYRINGE | INTRAVENOUS | Status: AC
  Filled 2016-07-15: qty 20

## 2016-07-15 MED ORDER — FENTANYL 2.5 MCG/ML BUPIVACAINE 1/10 % EPIDURAL INFUSION (WH - ANES)
INTRAMUSCULAR | Status: AC
  Filled 2016-07-15: qty 125

## 2016-07-15 MED ORDER — LACTATED RINGERS IV SOLN
INTRAVENOUS | Status: DC
  Administered 2016-07-15: 18:00:00 via INTRAUTERINE

## 2016-07-15 MED ORDER — EPHEDRINE 5 MG/ML INJ
10.0000 mg | INTRAVENOUS | Status: DC | PRN
  Filled 2016-07-15: qty 4

## 2016-07-15 MED ORDER — PHENYLEPHRINE 40 MCG/ML (10ML) SYRINGE FOR IV PUSH (FOR BLOOD PRESSURE SUPPORT)
80.0000 ug | PREFILLED_SYRINGE | INTRAVENOUS | Status: DC | PRN
  Filled 2016-07-15: qty 5

## 2016-07-15 MED ORDER — LACTATED RINGERS IV SOLN: 500.0000 mL | Freq: Once | INTRAVENOUS | Status: DC

## 2016-07-15 MED ORDER — LIDOCAINE HCL (PF) 1 % IJ SOLN
INTRAMUSCULAR | Status: DC | PRN
  Administered 2016-07-15 (×2): 6 mL

## 2016-07-15 MED ORDER — FENTANYL 2.5 MCG/ML BUPIVACAINE 1/10 % EPIDURAL INFUSION (WH - ANES)
14.0000 mL/h | INTRAMUSCULAR | Status: DC | PRN
  Administered 2016-07-15 – 2016-07-16 (×4): 14 mL/h via EPIDURAL
  Filled 2016-07-15 (×3): qty 125

## 2016-07-15 MED ORDER — DIPHENHYDRAMINE HCL 50 MG/ML IJ SOLN: 12.5000 mg | INTRAMUSCULAR | Status: DC | PRN

## 2016-07-15 MED ORDER — LACTATED RINGERS IV SOLN
500.0000 mL | Freq: Once | INTRAVENOUS | Status: AC
  Administered 2016-07-15: 500 mL via INTRAVENOUS

## 2016-07-15 NOTE — Progress Notes (Signed)
LABOR PROGRESS NOTE  Kimberly Reid is a 25 y.o. G1P0000 at 6672w0d  admitted for IOL for A1DM.  Subjective: Patient doing well. Denies any concerns at this time.   Objective: BP 123/71   Pulse 77   Temp 98.4 F (36.9 C) (Oral)   Resp 18   Ht 5\' 2"  (1.575 m)   Wt 224 lb (101.6 kg)   LMP 08/31/2015   BMI 40.97 kg/m  or  Vitals:   07/14/16 1851 07/14/16 2037 07/15/16 0104 07/15/16 0134  BP:  (!) 107/54 119/69 123/71  Pulse:  66 66 77  Resp: 20 20 18 18   Temp:  98.5 F (36.9 C) 98.4 F (36.9 C)   TempSrc:  Oral Oral   Weight:      Height:       NAD.  Dilation: 4.5 Effacement (%): 80 Station: -1 Presentation: Vertex Exam by:: LCarpenter,RN   FHT: baseline rate 135, moderate variability, +acel, no decel Toco: ctx q 2-4 min  Labs: Lab Results  Component Value Date   WBC 6.9 07/14/2016   HGB 12.4 07/14/2016   HCT 36.6 07/14/2016   MCV 82.6 07/14/2016   PLT 215 07/14/2016    Patient Active Problem List   Diagnosis Date Noted  . Pregnant 07/14/2016  . Gestational diabetes mellitus (GDM) affecting pregnancy 07/14/2016  . LGA (large for gestational age) fetus 07/12/2016  . Gestational diabetes mellitus (GDM) affecting pregnancy, antepartum (HCC) 05/20/2016  . Pregnancy, supervision, high-risk 02/20/2016  . PCOS (polycystic ovarian syndrome) 05/26/2013  . 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 G1P0 at 5872w0d here for IOL for A1DM  Labor: S/p FB and cytotec. Pit, currently at 436mU/min Fetal Wellbeing:  Category I Pain Control: supportive care at this time. May have epidural  if pt desires Anticipated MOD:  SVD  Frederik PearJulie P Apoorva Bugay, MD 07/15/2016, 3:58 AM

## 2016-07-15 NOTE — Progress Notes (Signed)
Patient ID: Kimberly Reid, female   DOB: 1990-12-23, 25 y.o.   MRN: [id number] LABOR PROGRESS NOTE  Kimberly Reid is a 25 y.o. G1P0000 at 2221w1d  admitted for IOL for gDMA1  Subjective: Doing well, feeling contractions  Objective: BP (!) 120/58   Pulse 86   Temp 98.1 F (36.7 C)   Resp 18   Ht 5\' 2"  (1.575 m)   Wt 101.6 kg (224 lb)   LMP 08/31/2015   SpO2 100%   BMI 40.97 kg/m  or  Vitals:   07/15/16 1500 07/15/16 1530 07/15/16 1600 07/15/16 1630  BP: (!) 110/50 119/80 122/67 (!) 120/58  Pulse: 74 90 82 86  Resp: 18 18 18 18   Temp:    98.1 F (36.7 C)  TempSrc:      SpO2:      Weight:      Height:         Dilation: 6 Effacement (%): 80 Cervical Position: Anterior Station: -2, -1 Presentation: Vertex Exam by:: Cher NakaiKristin McLeod Rn FHT: 135 baseline, moderate variability, +accelerations, recurrent variables Uterine activity: q2-205min  Labs: Lab Results  Component Value Date   WBC 6.9 07/14/2016   HGB 12.4 07/14/2016   HCT 36.6 07/14/2016   MCV 82.6 07/14/2016   PLT 215 07/14/2016    Patient Active Problem List   Diagnosis Date Noted  . Pregnant 07/14/2016  . Gestational diabetes mellitus (GDM) affecting pregnancy 07/14/2016  . LGA (large for gestational age) fetus 07/12/2016  . Gestational diabetes mellitus (GDM) affecting pregnancy, antepartum (HCC) 05/20/2016  . Pregnancy, supervision, high-risk 02/20/2016  . PCOS (polycystic ovarian syndrome) 05/26/2013  . 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 G1P0000 at 2621w1d here for IOL for gDMA2  Labor: IUPC now placed, start amnioinfusion and continue pitocin Fetal Wellbeing:  Category II Pain Control:  epidural Anticipated MOD:  SVD  Leland HerElsia J Carrissa Taitano, DO PGY-1 9/18/20175:59 PM

## 2016-07-15 NOTE — Progress Notes (Signed)
Asked patient if she would like and feel more comfortable to have interpreter present for epidural placement. Patient responded that she did not feel it was needed. I told her if she changed her mind I could have the interpreter come to the bedside

## 2016-07-15 NOTE — Anesthesia Preprocedure Evaluation (Addendum)
Anesthesia Evaluation  Patient identified by MRN, date of birth, ID band Patient awake    Reviewed: Allergy & Precautions, NPO status , Patient's Chart, lab work & pertinent test results  Airway Mallampati: II  TM Distance: >3 FB Neck ROM: Full    Dental no notable dental hx.    Pulmonary neg pulmonary ROS,    Pulmonary exam normal breath sounds clear to auscultation       Cardiovascular Exercise Tolerance: Good Normal cardiovascular exam+ Valvular Problems/Murmurs  Rhythm:Regular Rate:Normal  Heart surgery as a child. No current problems nor symptoms.   Neuro/Psych negative neurological ROS  negative psych ROS   GI/Hepatic negative GI ROS, Neg liver ROS,   Endo/Other  diabetes, GestationalMorbid obesity  Renal/GU negative Renal ROS  negative genitourinary   Musculoskeletal negative musculoskeletal ROS (+)   Abdominal (+) + obese,   Peds negative pediatric ROS (+)  Hematology negative hematology ROS (+)   Anesthesia Other Findings   Reproductive/Obstetrics negative OB ROS                            Anesthesia Physical Anesthesia Plan  ASA: III  Anesthesia Plan: Epidural   Post-op Pain Management:    Induction: Intravenous  Airway Management Planned: Natural Airway  Additional Equipment:   Intra-op Plan:   Post-operative Plan:   Informed Consent: I have reviewed the patients History and Physical, chart, labs and discussed the procedure including the risks, benefits and alternatives for the proposed anesthesia with the patient or authorized representative who has indicated his/her understanding and acceptance.   Dental advisory given  Plan Discussed with: CRNA  Anesthesia Plan Comments: (Informed consent obtained prior to proceeding including risk of failure, 1% risk of PDPH, risk of minor discomfort and bruising.  Discussed rare but serious complications including epidural  abscess, permanent nerve injury, epidural hematoma.  Discussed alternatives to epidural analgesia and patient desires to proceed.  Timeout performed pre-procedure verifying patient name, procedure, and platelet count.  Patient tolerated procedure well.)       Anesthesia Quick Evaluation

## 2016-07-15 NOTE — Progress Notes (Signed)
Patient ID: Kimberly Reid, female   DOB: [birth year], 25 y.o.   MRN: [id number] LABOR PROGRESS NOTE  Kimberly Reid is a 25 y.o. G1P0000 at 3014w1d  admitted for IOL for gDMA1  Subjective: Doing well, more comfortable with epidural  Objective: BP 116/66   Pulse 80   Temp 98.6 F (37 C) (Oral)   Resp 18   Ht 5\' 2"  (1.575 m)   Wt 101.6 kg (224 lb)   LMP 08/31/2015   SpO2 100%   BMI 40.97 kg/m  or  Vitals:   07/15/16 1425 07/15/16 1430 07/15/16 1432 07/15/16 1435  BP: (!) 122/54 115/70 (!) 108/57 116/66  Pulse: 81 77 71 80  Resp:      Temp:      TempSrc:      SpO2:    100%  Weight:      Height:         Dilation: 6 Effacement (%): 50 Cervical Position: Middle Station: -2 Presentation: Vertex Exam by:: Dr Artist PaisYoo FHT: 130 baseline, moderate variability, +accelerations, no decelerations Uterine activity: q2-524min  Labs: Lab Results  Component Value Date   WBC 6.9 07/14/2016   HGB 12.4 07/14/2016   HCT 36.6 07/14/2016   MCV 82.6 07/14/2016   PLT 215 07/14/2016    Patient Active Problem List   Diagnosis Date Noted  . Pregnant 07/14/2016  . Gestational diabetes mellitus (GDM) affecting pregnancy 07/14/2016  . LGA (large for gestational age) fetus 07/12/2016  . Gestational diabetes mellitus (GDM) affecting pregnancy, antepartum (HCC) 05/20/2016  . Pregnancy, supervision, high-risk 02/20/2016  . PCOS (polycystic ovarian syndrome) 05/26/2013  . 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 G1P0000 at 9314w1d here for IOL for gDMA1  Labor: now AROM to clear, pitocin Fetal Wellbeing:  Category I Pain Control:  epidural Anticipated MOD:  SVD  Kimberly HerElsia J Rondal Vandevelde, DO PGY-1 9/18/20172:43 PM

## 2016-07-15 NOTE — Anesthesia Procedure Notes (Signed)
Epidural Patient location during procedure: OB  Staffing Anesthesiologist: Sherrian DiversENENNY, Junette Bernat  Preanesthetic Checklist Completed: patient identified, site marked, surgical consent, pre-op evaluation, timeout performed, IV checked, risks and benefits discussed and monitors and equipment checked  Epidural Patient position: sitting Prep: DuraPrep Patient monitoring: blood pressure and heart rate Approach: midline Location: L4-L5 Injection technique: LOR saline  Needle:  Needle type: Tuohy  Needle gauge: 17 G Needle length: 9 cm Needle insertion depth: 6.5 cm Catheter type: closed end flexible Catheter size: 19 Gauge Catheter at skin depth: 15 cm Test dose: negative and Other  Assessment Events: blood not aspirated, injection not painful, no injection resistance, negative IV test and no paresthesia  Additional Notes Reason for block:procedure for pain

## 2016-07-16 ENCOUNTER — Encounter (HOSPITAL_COMMUNITY): Payer: Self-pay

## 2016-07-16 DIAGNOSIS — Z3A39 39 weeks gestation of pregnancy: Secondary | ICD-10-CM

## 2016-07-16 DIAGNOSIS — O2402 Pre-existing diabetes mellitus, type 1, in childbirth: Secondary | ICD-10-CM

## 2016-07-16 DIAGNOSIS — E109 Type 1 diabetes mellitus without complications: Secondary | ICD-10-CM

## 2016-07-16 LAB — GLUCOSE, CAPILLARY
Glucose-Capillary: 80 mg/dL (ref 65–99)
Glucose-Capillary: 75 mg/dL (ref 65–99)

## 2016-07-16 MED ORDER — DIPHENHYDRAMINE HCL 25 MG PO CAPS: 25.0000 mg | ORAL_CAPSULE | Freq: Four times a day (QID) | ORAL | Status: DC | PRN

## 2016-07-16 MED ORDER — PRENATAL MULTIVITAMIN CH
1.0000 | ORAL_TABLET | Freq: Every day | ORAL | Status: DC
  Administered 2016-07-17 – 2016-07-18 (×2): 1 via ORAL
  Filled 2016-07-16 (×2): qty 1

## 2016-07-16 MED ORDER — IBUPROFEN 600 MG PO TABS
600.0000 mg | ORAL_TABLET | Freq: Four times a day (QID) | ORAL | Status: DC
  Administered 2016-07-16 – 2016-07-18 (×8): 600 mg via ORAL
  Filled 2016-07-16 (×8): qty 1

## 2016-07-16 MED ORDER — BENZOCAINE-MENTHOL 20-0.5 % EX AERO
1.0000 "application " | INHALATION_SPRAY | CUTANEOUS | Status: DC | PRN
  Filled 2016-07-16: qty 56

## 2016-07-16 MED ORDER — SENNOSIDES-DOCUSATE SODIUM 8.6-50 MG PO TABS
2.0000 | ORAL_TABLET | ORAL | Status: DC
  Administered 2016-07-17 – 2016-07-18 (×2): 2 via ORAL
  Filled 2016-07-16 (×2): qty 2

## 2016-07-16 MED ORDER — INFLUENZA VAC SPLIT QUAD 0.5 ML IM SUSY
0.5000 mL | PREFILLED_SYRINGE | INTRAMUSCULAR | Status: AC
  Administered 2016-07-17: 0.5 mL via INTRAMUSCULAR
  Filled 2016-07-16: qty 0.5

## 2016-07-16 MED ORDER — WITCH HAZEL-GLYCERIN EX PADS: 1.0000 "application " | MEDICATED_PAD | CUTANEOUS | Status: DC | PRN

## 2016-07-16 MED ORDER — ONDANSETRON HCL 4 MG/2ML IJ SOLN: 4.0000 mg | INTRAMUSCULAR | Status: DC | PRN

## 2016-07-16 MED ORDER — ZOLPIDEM TARTRATE 5 MG PO TABS: 5.0000 mg | ORAL_TABLET | Freq: Every evening | ORAL | Status: DC | PRN

## 2016-07-16 MED ORDER — DIBUCAINE 1 % RE OINT: 1.0000 "application " | TOPICAL_OINTMENT | RECTAL | Status: DC | PRN

## 2016-07-16 MED ORDER — ACETAMINOPHEN 325 MG PO TABS: 650.0000 mg | ORAL_TABLET | ORAL | Status: DC | PRN

## 2016-07-16 MED ORDER — VITAMIN K1 1 MG/0.5ML IJ SOLN
INTRAMUSCULAR | Status: AC
  Filled 2016-07-16: qty 0.5

## 2016-07-16 MED ORDER — SIMETHICONE 80 MG PO CHEW: 80.0000 mg | CHEWABLE_TABLET | ORAL | Status: DC | PRN

## 2016-07-16 MED ORDER — COCONUT OIL OIL
1.0000 | TOPICAL_OIL | Status: DC | PRN
Start: 2016-07-16 — End: 2016-07-18

## 2016-07-16 MED ORDER — ONDANSETRON HCL 4 MG PO TABS: 4.0000 mg | ORAL_TABLET | ORAL | Status: DC | PRN

## 2016-07-16 MED ORDER — TETANUS-DIPHTH-ACELL PERTUSSIS 5-2.5-18.5 LF-MCG/0.5 IM SUSP: 0.5000 mL | Freq: Once | INTRAMUSCULAR | Status: DC

## 2016-07-16 NOTE — Lactation Note (Addendum)
This note was copied from a baby's chart. Lactation Consultation Note Initial visit at 7 hours of age.  Mom reports baby has not latched with several attempts.  Mom has given 1ml of EBM on spoon, but reports baby is not licking off the spoon.  Mom has been set up with DEBP by Rn and pumped 7mls.  LC assisted with STS football hold, baby does not open mouth wide for latching, with sandwiching breast baby sucked a few times and stopped.  Mom reported pain with latch, but latch was brief.  LC assisted with spoon feeding.  Tongue noted to have cleft on tip and not extending well to lick off spoon.  Placed baby back to breast as baby is more awake now.  Baby is sleepy and not eager to eat at this time.  Mom holding baby STS and will offer breast feedings and spoon feedings to give baby EBM.  [facility] given and discussed.  Encouraged to feed with early cues on demand.  Early newborn behavior discussed.  Hand expression demonstrated by mom with colostrum visible.  Mom to call for assist as needed.  Chart indicates mom has history of PCOS, mom reports irregular cycles.  Mom reports normal breast changes during pregnancy.       Patient Name: Boy [name] Today's Date: 07/16/2016 Reason for consult: Initial assessment   Maternal Data Has patient been taught Hand Expression?: Yes Does the patient have breastfeeding experience prior to this delivery?: No  Feeding Feeding Type: Breast Fed Length of feed: 1 min (few sucks off and on)  LATCH Score/Interventions Latch: Repeated attempts needed to sustain latch, nipple held in mouth throughout feeding, stimulation needed to elicit sucking reflex. Intervention(s): Skin to skin;Teach feeding cues;Waking techniques Intervention(s): Breast massage;Breast compression;Assist with latch  Audible Swallowing: None Intervention(s): Hand expression  Type of Nipple: Everted at rest and after stimulation  Comfort (Breast/Nipple): Soft /  non-tender     Hold (Positioning): Assistance needed to correctly position infant at breast and maintain latch. Intervention(s): Breastfeeding basics reviewed;Support Pillows;Position options;Skin to skin  LATCH Score: 6  Lactation Tools Discussed/Used WIC Program: Yes Pump Review: Setup, frequency, and cleaning Initiated by:: Wyona AlmasEllen Branstrator RN    Consult Status Consult Status: Follow-up Date: 07/17/16 Follow-up type: In-patient    Jannifer RodneyShoptaw, Lilyanna Lunt Lynn 07/16/2016, 6:08 PM

## 2016-07-16 NOTE — Progress Notes (Addendum)
Verified with Dr Omer JackMumaw to cancel CBC order.

## 2016-07-16 NOTE — Progress Notes (Signed)
Interpretor at bedside. 

## 2016-07-16 NOTE — Progress Notes (Signed)
Patient ID: Kimberly Reid, female   DOB: Feb 14, 1991, 25 y.o.   MRN: [id number] LABOR PROGRESS NOTE  Kimberly Reid is a 25 y.o. G1P0000 at 1348w2d  admitted for IOL for gDMA1  Subjective: Feeling uncomfortable with lots of pelvic pressure  Objective: BP (!) 148/81   Pulse 98   Temp 100.2 F (37.9 C) (Axillary)   Resp 18   Ht 5\' 2"  (1.575 m)   Wt 101.6 kg (224 lb)   LMP 08/31/2015   SpO2 100%   BMI 40.97 kg/m  or  Vitals:   07/16/16 0731 07/16/16 0801 07/16/16 0820 07/16/16 0831  BP: 133/66 136/75  (!) 148/81  Pulse: 84 83  98  Resp: 18 18  18   Temp:   100.2 F (37.9 C)   TempSrc:   Axillary   SpO2:      Weight:      Height:         Dilation: Lip/rim Effacement (%): 90 Cervical Position: Anterior Station: +1 Presentation: Vertex Exam by:: Davis,RN FHT: 150 baseline, moderate variability, no accelerations, no decelerations Uterine activity: q2-484min  Labs: Lab Results  Component Value Date   WBC 6.9 07/14/2016   HGB 12.4 07/14/2016   HCT 36.6 07/14/2016   MCV 82.6 07/14/2016   PLT 215 07/14/2016    Patient Active Problem List   Diagnosis Date Noted  . Pregnant 07/14/2016  . Gestational diabetes mellitus (GDM) affecting pregnancy 07/14/2016  . LGA (large for gestational age) fetus 07/12/2016  . Gestational diabetes mellitus (GDM) affecting pregnancy, antepartum (HCC) 05/20/2016  . Pregnancy, supervision, high-risk 02/20/2016  . PCOS (polycystic ovarian syndrome) 05/26/2013  . 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 G1P0000 at 2948w2d here for IOL for gDMA1  Labor: pitocin Fetal Wellbeing:  Category I Pain Control:  epidural Anticipated MOD:  SVD  Leland HerElsia J Raliegh Scobie, DO PGY-1 9/19/20178:55 AM

## 2016-07-16 NOTE — Anesthesia Rounding Note (Signed)
  [facility] Epidural Rounding Note  Patient: [name], 25 y.o., female  Patient's current pain level: Pain Score: Asleep (07/15/16 2301)  Agreed upon pain management level: 7, prior to epidural placement on chart  Epidural intervention: Unable to assess - patient sleeping  Comments: patient comfortable  [facility] LL[facility]IGHT,Zyrus Hetland 07/16/2016

## 2016-07-16 NOTE — Anesthesia Postprocedure Evaluation (Signed)
Anesthesia Post Note  Patient: [name]  Procedure(s) Performed: * No procedures listed *  Patient location during evaluation: Mother Baby Anesthesia Type: Epidural Level of consciousness: awake and alert Pain management: pain level controlled Vital Signs Assessment: post-procedure vital signs reviewed and stable Respiratory status: spontaneous breathing and nonlabored ventilation Cardiovascular status: stable Postop Assessment: no headache, patient able to bend at knees, no backache, no signs of nausea or vomiting, epidural receding and adequate PO intake Anesthetic complications: no     Last Vitals:  Vitals:   07/16/16 1225 07/16/16 1330  BP: 118/74 123/73  Pulse: 76 91  Resp: (!) 22 20  Temp: 37.2 C 37.4 C    Last Pain:  Vitals:   07/16/16 1330  TempSrc: Oral  PainSc: 2    Pain Goal: Patients Stated Pain Goal: 2 (07/16/16 1330)               Shakiara Lukic Hristova

## 2016-07-16 NOTE — Progress Notes (Signed)
LABOR PROGRESS NOTE  Kimberly SpikesKaren Balderas Reid is a 25 y.o. G1P0000 at 375w2d  admitted for IOL for A1DM.  Subjective: Doing well. Pain controlled with epidural.  Objective: BP 117/69   Pulse 78   Temp 98.8 F (37.1 C) (Oral)   Resp 18   Ht 5\' 2"  (1.575 m)   Wt 224 lb (101.6 kg)   LMP 08/31/2015   SpO2 100%   BMI 40.97 kg/m  or  Vitals:   07/16/16 0001 07/16/16 0031 07/16/16 0101 07/16/16 0123  BP: 118/68 (!) 95/47 110/65 117/69  Pulse: 85 88 90 78  Resp:  18 18 18   Temp:  98.8 F (37.1 C)    TempSrc:  Oral    SpO2:      Weight:      Height:        NAD Dilation: 6 Effacement (%): 90 Cervical Position: Anterior Station: -2 Presentation: Vertex Exam by:: McGraw-HillMurayyah Johnson  FHT: baseline rate 140, moderate variability, +acel, occasional variable Toco: ctx q4-5 min  Labs: Lab Results  Component Value Date   WBC 6.9 07/14/2016   HGB 12.4 07/14/2016   HCT 36.6 07/14/2016   MCV 82.6 07/14/2016   PLT 215 07/14/2016    Assessment / Plan: 25 y.o. G1P0000 at 725w2d here for IOL fo A1DM  Labor: Continue Pitocin, currently at 1614mU/min. IUPC in place Fetal Wellbeing:  Category II Pain Control:  Epidural Anticipated MOD:  SVD  Frederik PearJulie P Degele, MD 07/16/2016, 1:30 AM

## 2016-07-17 NOTE — Discharge Instructions (Signed)
Parto vaginal, Cuidados posteriores  °(Vaginal Delivery, Care After) °Siga estas instrucciones durante las próximas semanas. Estas indicaciones para el alta le proporcionan información general acerca de cómo deberá cuidarse después del parto. El médico también podrá darle instrucciones específicas. El tratamiento ha sido planificado según las prácticas médicas actuales, pero en algunos casos pueden ocurrir problemas. Comuníquese con el médico si tiene algún problema o tiene preguntas al volver a su casa.  °INSTRUCCIONES PARA EL CUIDADO EN EL HOGAR  °· Tome sólo medicamentos de venta libre o recetados, según las indicaciones del médico o del farmacéutico. °· No beba alcohol, especialmente si está amamantando o toma analgésicos. °· No mastique tabaco ni fume. °· No consuma drogas. °· Continúe con un adecuado cuidado perineal. El buen cuidado perineal incluye: °¨ Higienizarse de adelante hacia atrás. °¨ Mantener la zona perineal limpia. °· No use tampones ni duchas vaginales hasta que su médico la autorice. °· Dúchese, lávese el cabello y tome baños de inmersión según las indicaciones de su médico. °· Utilice un sostén que le ajuste bien y que brinde buen soporte a sus mamas. °· Consuma alimentos saludables. °· Beba suficiente líquido para mantener la orina clara o de color amarillo pálido. °· Consuma alimentos ricos en fibra como cereales y panes integrales, arroz, frijoles y frutas y verduras frescas todos los días. Estos alimentos pueden ayudarla a prevenir o aliviar el estreñimiento. °· Siga las recomendaciones de su médico relacionadas con la reanudación de actividades como subir escaleras, conducir automóviles, levantar objetos, hacer ejercicios o viajar. °· Hable con su médico acerca de reanudar la actividad sexual. Volver a la actividad sexual depende del riesgo de infección, la velocidad de la curación y la comodidad y su deseo de reanudarla. °· Trate de que alguien la ayude con las actividades del hogar y con  el recién nacido al menos durante un par de días después de salir del hospital. °· Descanse todo lo que pueda. Trate de descansar o tomar una siesta mientras el bebé está durmiendo. °· Aumente sus actividades gradualmente. °· Cumpla con todas las visitas de control programadas para después del parto. Es muy importante asistir a todas las citas programadas de seguimiento. En estas citas, su médico va a controlarla para asegurarse de que esté sanando física y emocionalmente. °SOLICITE ATENCIÓN MÉDICA SI:  °· Elimina coágulos grandes por la vagina. Guarde algunos coágulos para mostrarle al médico. °· Tiene una secreción con feo olor que proviene de la vagina. °· Tiene dificultad para orinar. °· Orina con frecuencia. °· Siente dolor al orinar. °· Nota un cambio en sus movimientos intestinales. °· Aumenta el enrojecimiento, el dolor o la hinchazón en la zona de la incisión vaginal (episiotomía) o el desgarro vaginal. °· Tiene pus que drena por la episiotomía o el desgarro vaginal. °· La episiotomía o el desgarro vaginal se abren. °· Sus mamas le duelen, están duras o enrojecidas. °· Sufre un dolor intenso de cabeza. °· Tiene visión borrosa o ve manchas. °· Se siente triste o deprimida. °· Tiene pensamientos acerca de lastimarse o dañar al recién nacido. °· Tiene preguntas acerca de su cuidado personal, el cuidado del recién nacido o acerca de los medicamentos. °· Se siente mareada o sufre un desmayo. °· Tiene una erupción. °· Tiene náuseas o vómitos. °· Usted amamantó al bebé y no ha tenido su período menstrual dentro de las 12 semanas después de dejar de amamantar. °· No amamanta al bebé y no tuvo su período menstrual en las últimas 12° semanas después del   parto. °· Tiene fiebre. °SOLICITE ATENCIÓN MÉDICA DE INMEDIATO SI:  °· Siente dolor persistente. °· Siente dolor en el pecho. °· Le falta el aire. °· Se desmaya. °· Siente dolor en la pierna. °· Siente dolor en el estómago. °· El sangrado vaginal satura dos o más  apósitos en 1 hora. °  °Esta información no tiene como fin reemplazar el consejo del médico. Asegúrese de hacerle al médico cualquier pregunta que tenga. °  °Document Released: 10/14/2005 Document Revised: 07/05/2015 °Elsevier Interactive Patient Education ©2016 [employer] ° °

## 2016-07-17 NOTE — Plan of Care (Signed)
Problem: Bowel/Gastric: Goal: Gastrointestinal status will improve Outcome: Progressing Passing gas   

## 2016-07-17 NOTE — Progress Notes (Signed)
Patient has been working with Advertising copywriterLactation consultant, pumping and having baby latched for past 20-30 min. Upon standing to go to bathroom, peri-pad was 75% soaked with sero-sanquinous lochia with a few dribbles to floor. Patient voided large amt urine in comode w/o difficulty and immediately got into shower, no clots were seen. After shower completed, fundus was found to be firm, 1 below umbilicus, no clots or gush of fluid on palpation. VS wnl, no excessive cramping or pain reported. And LC resumed working with patient to express colostrum to feed to infant.  Will continue to observe patient for any further episodes of bleeding. Reported to Rolin BarryMari Hernandez, RN, oncoming night nurse for patient.

## 2016-07-17 NOTE — Lactation Note (Signed)
This note was copied from a baby's chart. Lactation Consultation Note  Patient Name: Boy [name] JXBJY'NToday's Date: 07/17/2016 Reason for consult: Follow-up assessment   With this mom and term baby, now 5331 hours old. He voided today at 1300 for the first time since birth. He has a tongue that he can extend over his gum line, but it tends to sit behind his gums. His chin is a little recessed, and I advised mom to do tummy time, once home, about 3 times a day. ( I explained to he how to do this).  The baby will latch to mom's left breast, but is very fussy with the right. I asissted mom with latching him in football hold to her right breast. He refused to stay latched without a shield. I fitted mom with a 20 nipple shield and filled the shield multiple times with a total of 7 ml's of EBM. I was able to fill multiple times, because the baby was on and off frequensly. After recieving the 7 ml's, he calmed, and stayed latched with some suckles for an additional 10 minutes.  I advised mom to try and feed him at least every 3 hours, and more with cues, and to pump as close to every 3 hours  As she can. Mom has lots of easily expressed colostrum, and she has been spoon feeding him. I brought mom a foley cup, and explained to her how to use it. Mom's nurse, Debbie. N, will also assist mom with cup feeding, as needed. Mom knows to call for lactation as need, with questions/concerns.    Maternal Data    Feeding Feeding Type: Breast Fed Length of feed: 15 min (with 20 nipple shiled)  LATCH Score/Interventions Latch: Repeated attempts needed to sustain latch, nipple held in mouth throughout feeding, stimulation needed to elicit sucking reflex. (baby on and off breast, seems frustrated, even with 20 nipple shiled. ) Intervention(s): Adjust position;Assist with latch;Breast compression  Audible Swallowing: A few with stimulation Intervention(s): Hand expression  Type of Nipple: Everted at rest and  after stimulation  Comfort (Breast/Nipple): Soft / non-tender     Hold (Positioning): Assistance needed to correctly position infant at breast and maintain latch. Intervention(s): Breastfeeding basics reviewed;Support Pillows;Position options;Skin to skin  LATCH Score: 7  Lactation Tools Discussed/Used     Consult Status Consult Status: Follow-up Date: 07/18/16 Follow-up type: In-patient    Alfred LevinsLee, Kayse Puccini Anne 07/17/2016, 6:11 PM

## 2016-07-17 NOTE — Lactation Note (Signed)
This note was copied from a baby's chart. Lactation Consultation Note  Patient Name: Boy [name] [id number]'UToday's Date: 07/17/2016 Reason for consult: Follow-up assessment  With this mom and now 6333 hours old term baby. I spoke to Dr. Ezequiel EssexGable about my concerns that this baby has only voided once since birth, has trouble latching, even with nipple shield. Dr. Ezequiel EssexGable felt the baby should be supplemented with about 20 ml's EBM?formula, so mom agreed to bottle feed EBM and then add Alimentum 20 cal formula. He took a total of 25 this feeding, and did wet burp about 2-3 ml's of formula at the end of the feeding. Mom is to try breast feeding at least every 3 hours, offer EBM, and then formula , up to 20 ml's total, and then pump for 15 min in initiation setting.  I made mom an o/p appointment for next WED, 9/27 at 1 pm. [facility]. Mom is willing to do a 12 day loaner, [facility] at discharge.    Maternal Data    Feeding Feeding Type: Bottle Fed - Formula (Alimentum 20 cal) Nipple Type: Slow - flow Length of feed: 15 min  LATCH Score/Interventions Latch: Repeated attempts needed to sustain latch, nipple held in mouth throughout feeding, stimulation needed to elicit sucking reflex. (baby on and off breast, seems frustrated, even with 20 nipple shiled. ) Intervention(s): Adjust position;Assist with latch;Breast compression  Audible Swallowing: A few with stimulation Intervention(s): Hand expression  Type of Nipple: Everted at rest and after stimulation  Comfort (Breast/Nipple): Soft / non-tender     Hold (Positioning): Assistance needed to correctly position infant at breast and maintain latch. Intervention(s): Breastfeeding basics reviewed;Support Pillows;Position options;Skin to skin  LATCH Score: 7  Lactation Tools Discussed/Used     Consult Status Consult Status: Follow-up Date: 07/18/16 Follow-up type:  In-patient    Alfred LevinsLee, Kale Rondeau Anne 07/17/2016, 7:24 PM

## 2016-07-17 NOTE — Progress Notes (Addendum)
This nurse was called into room. Patient had voided and changed pad; when she stood up from toilet, she experienced a sudden "rush" of fluid, which soaked 1/3 of regular period type pad. No dizziness, nausea; pt walking around room and feeling "good."

## 2016-07-17 NOTE — Progress Notes (Signed)
Post Partum Day 1 Subjective: no complaints, up ad lib, voiding, tolerating PO and + flatus  Objective: Blood pressure 107/66, pulse 76, temperature 97.9 F (36.6 C), temperature source Oral, resp. rate 18, height 5\' 2"  (1.575 m), weight 101.6 kg (224 lb), last menstrual period 08/31/2015, SpO2 100 %, unknown if currently breastfeeding.  Physical Exam:  General: alert, cooperative and no distress Lochia: appropriate Uterine Fundus: firm DVT Evaluation: No evidence of DVT seen on physical exam. Negative Homan's sign.   Recent Labs  07/14/16 1015  HGB 12.4  HCT 36.6    Assessment/Plan: Plan for discharge tomorrow   LOS: 3 days   Leland Herlsia J Yoo 07/17/2016, 8:38 AM   OB FELLOW POSTPARTUM PROGRESS NOTE ATTESTATION  I have seen and examined this patient and agree with above documentation in the resident's note.   Ernestina PennaNicholas Terrence Pizana, MD 12:09 PM

## 2016-07-18 ENCOUNTER — Encounter: Payer: Self-pay | Admitting: Obstetrics & Gynecology

## 2016-07-18 ENCOUNTER — Other Ambulatory Visit: Payer: Self-pay | Admitting: Obstetrics & Gynecology

## 2016-07-18 MED ORDER — SENNOSIDES-DOCUSATE SODIUM 8.6-50 MG PO TABS: 2.0000 | ORAL_TABLET | ORAL | 0 refills | Status: DC

## 2016-07-18 MED ORDER — IBUPROFEN 600 MG PO TABS: 600.0000 mg | ORAL_TABLET | Freq: Four times a day (QID) | ORAL | 0 refills | Status: DC | PRN

## 2016-07-18 NOTE — Discharge Summary (Signed)
OB Discharge Summary     Patient Name: Kimberly SpikesKaren Balderas Reid DOB: 05-Feb-1991 MRN: [id number]  Date of admission: 07/14/2016 Delivering MD: Adam PhenixARNOLD, JAMES G   Date of discharge: 07/18/2016  Admitting diagnosis: INDUCTION Intrauterine pregnancy: 3728w2d     Secondary diagnosis:  Active Problems:   Pregnant   Gestational diabetes mellitus (GDM) affecting pregnancy   SVD (spontaneous vaginal delivery)  Additional problems: none     Discharge diagnosis: Term Pregnancy Delivered and GDM A1                                                                                                Post partum procedures:none  Augmentation: AROM, Pitocin, Cytotec and Foley Balloon  Complications: None  Hospital course:  Induction of Labor With Vaginal Delivery   25 y.o. yo G1P1001 at 9328w2d was admitted to the hospital 07/14/2016 for induction of labor.  Indication for induction: A1 DM.  Patient had an uncomplicated labor course as follows: Membrane Rupture Time/Date: 2:40 PM ,07/15/2016   Intrapartum Procedures: Episiotomy: None [1]                                         Lacerations:  2nd degree [3]  Patient had delivery of a Viable infant.  Information for the patient's newborn:  Kimberly Reid, Boy Sandeep [[id number]][[id number]]  Delivery Method: Vaginal, Spontaneous Delivery (Filed from Delivery Summary)   07/16/2016  Details of delivery can be found in separate delivery note.  Patient had a routine postpartum course. Patient is discharged home 07/18/16.   Physical exam Vitals:   07/16/16 1740 07/17/16 0608 07/17/16 1758 07/18/16 0621  BP: 124/72 107/66 140/73 (!) 93/43  Pulse: 92 76 85 72  Resp: 18 18 18 18   Temp: 98.8 F (37.1 C) 97.9 F (36.6 C) 98.3 F (36.8 C) 97.8 F (36.6 C)  TempSrc: Oral Oral Oral Oral  SpO2:    100%  Weight:      Height:       General: alert and cooperative Lochia: appropriate Uterine Fundus: firm Incision: N/A DVT Evaluation: No evidence of DVT seen on physical  exam. Labs: Lab Results  Component Value Date   WBC 6.9 07/14/2016   HGB 12.4 07/14/2016   HCT 36.6 07/14/2016   MCV 82.6 07/14/2016   PLT 215 07/14/2016   CMP Latest Ref Rng & Units 04/24/2016  Glucose 65 - 104 mg/dL 86  BUN 6 - 23 mg/dL -  Creatinine 7.820.50 - 9.561.10 mg/dL -  Sodium 213135 - 086145 mEq/L -  Potassium 3.5 - 5.3 mEq/L -  Chloride 96 - 112 mEq/L -  CO2 19 - 32 mEq/L -  Calcium 8.4 - 10.5 mg/dL -  Total Protein 6.0 - 8.3 g/dL -  Total Bilirubin 0.2 - 1.2 mg/dL -  Alkaline Phos 39 - 578117 U/L -  AST 0 - 37 U/L -  ALT 0 - 35 U/L -    Discharge instruction: per After Visit Summary and "Baby and Me Booklet".  After  visit meds:    Medication List    TAKE these medications   ibuprofen 600 MG tablet Commonly known as:  ADVIL,MOTRIN Take 1 tablet (600 mg total) by mouth every 6 (six) hours as needed.   multivitamin-prenatal 27-0.8 MG Tabs tablet Take 1 tablet by mouth daily at 12 noon.   senna-docusate 8.6-50 MG tablet Commonly known as:  Senokot-S Take 2 tablets by mouth daily.       Diet: routine diet  Activity: Advance as tolerated. Pelvic rest for 6 weeks.   Outpatient follow up:6 weeks- needs 2hr glucola @ PP visit Follow up Appt:Future Appointments Date Time Provider Department Center  07/24/2016 1:00 PM [facility] LAC CONSULTANT [facility] None  08/08/2016 3:20 PM Catalina Antigua, MD [facility]   Follow up Visit:No Follow-up on file.  Postpartum contraception: Condoms  Newborn Data: Live born female  Birth Weight: 7 lb 1 oz (3204 g) APGAR: 8, 9  Baby Feeding: Breast Disposition:home with mother   07/18/2016 Cam Hai, CNM  9:40 AM

## 2016-07-18 NOTE — Lactation Note (Signed)
This note was copied from a baby's chart. Lactation Consultation Note  Mother has been pumping and bottle feeding breastmilk and the difference w/ formula. She pumped 14 ml today.  Recommend she call WIC to get pump sooner.  Referral has been spent. Mom encouraged to feed baby 8-12 times/24 hours and with feeding cues.  Reviewed engorgement care and monitoring voids/stools.  Patient Name: Kimberly [name] Today's Date: 07/18/2016     Maternal Data    Feeding    LATCH Score/Interventions                      Lactation Tools Discussed/Used     Consult Status      Dahlia ByesBerkelhammer, Ruth [facility] 07/18/2016, 11:35 AM

## 2016-07-24 ENCOUNTER — Ambulatory Visit (HOSPITAL_COMMUNITY)
Admit: 2016-07-24 | Discharge: 2016-07-24 | Disposition: A | Discharge status: Home / Self Care | Payer: Self-pay | Attending: Family Medicine | Admitting: Family Medicine

## 2016-07-24 NOTE — Lactation Note (Signed)
Lactation Consult: Mardene CelesteJoanna spanish interpreter present for this visit. Weight today 7 lbs 4.8 oz 3310 g. Mom reports nipples are very sore and she does not want to latch baby to the breast at this time.Both nipples with cracking and scabs noted.  Has Ameda pump Mom pumped and family member bottle fed EBM to baby. He is getting all EBM no formula. Flanges are too small for mom's nipples. Mom pumped here for 25 min. Reports she is consistently pumping q 2-3 hours. Praise given for her efforts. [facility]- encouraged to call them and get one ASAP and to use larger flanges. Is also using lanolin. Encouraged to stop using lanolin- to use EBM and coconut oil instead. If larger flanges don't help, encouraged to call OB. # 30 flanges given and SN shells to keep clothes off her nipples. Reports she has IN shells but they are too small. Reports SN shells feel good. No further questions at present. Encouraged to call for another appointment when nipples heal for assist with latch.   Mother's reason for visit:  Here because of sore nipples Visit Type:  Feeding assessment Appointment Notes:  Using NS in hospital Consult:  Initial Lactation Consultant:  Pamelia HoitWeeks, Alani Lacivita D  ________________________________________________________________________  Baby's Name:  Patterson HammersmithAlan Daniel Juarez-Balderas Date of Birth:  07/16/2017 Gender:  female Gestational Age: 5844w2d (At Birth) Birth Weight:  7 lb 1 oz (3204 g) Weight at Discharge:  Weight: 7 lb (3175 g)              Date of Discharge:  07/18/2016      [facility]   07/16/16 1024 07/17/16 0102 07/17/16 2354  Weight: 7 lb 1 oz (3204 g) 7 lb 0.2 oz (3180 g) 7 lb (3175 g)    ________________________________________________________________________  Mother's Name: [name] Type of delivery:  vag Breastfeeding Experience:  p1  ________________________________________________________________________  Breastfeeding History (Post Discharge)  Frequency of  breastfeeding:  Has not put the baby to the breast since Saturday, reports he has been having trouble getting latched on even with the NS   Supplementation  Formula:  Volume 0 ml       Breastmilk:  Volume 3 oz Frequency:  q 2 1/2 -3 hours  Method:  Bottle,   Pumping  Type of pump:  Ameda Frequency:  q 2 1/2 - 3 hours Volume:  5-6 oz    Infant Intake and Output Assessment  Voids:  10 + in 24 hrs.  Color:  Clear yellow had void while here for appointment Stools:  10+  in 24 hrs.  Color:  Yellow  ________________________________________________________________________  Maternal Breast Assessment  Breast:  Filling Nipple:  Erect, Cracked, Bleeding and Scabs   _______________________________________________________________________ Feeding Assessment/Evaluation  Initial feeding assessment:   Total amount pumped post feed:  7 oz total Total supplement given:  4 oz

## 2016-07-25 ENCOUNTER — Encounter: Payer: Self-pay | Admitting: Family Medicine

## 2016-07-29 ENCOUNTER — Encounter (HOSPITAL_COMMUNITY): Payer: Self-pay | Admitting: *Deleted

## 2016-07-29 ENCOUNTER — Inpatient Hospital Stay (HOSPITAL_COMMUNITY)
Admission: AD | Admit: 2016-07-29 | Discharge: 2016-07-29 | Disposition: A | Discharge status: Home / Self Care | Payer: Self-pay | Source: Ambulatory Visit | Attending: Obstetrics and Gynecology | Admitting: Obstetrics and Gynecology

## 2016-07-29 DIAGNOSIS — N3000 Acute cystitis without hematuria: Secondary | ICD-10-CM | POA: Insufficient documentation

## 2016-07-29 DIAGNOSIS — Z7722 Contact with and (suspected) exposure to environmental tobacco smoke (acute) (chronic): Secondary | ICD-10-CM | POA: Insufficient documentation

## 2016-07-29 LAB — URINE MICROSCOPIC-ADD ON

## 2016-07-29 LAB — CBC WITH DIFFERENTIAL/PLATELET
BASOS ABS: 0 10*3/uL (ref 0.0–0.1)
Basophils Relative: 0 %
EOS ABS: 0.1 10*3/uL (ref 0.0–0.7)
EOS PCT: 1 %
HCT: 34.7 % — ABNORMAL LOW (ref 36.0–46.0)
HEMOGLOBIN: 11.9 g/dL — AB (ref 12.0–15.0)
Lymphocytes Relative: 12 %
Lymphs Abs: 1.6 10*3/uL (ref 0.7–4.0)
MCH: 28.5 pg (ref 26.0–34.0)
MCHC: 34.3 g/dL (ref 30.0–36.0)
MCV: 83 fL (ref 78.0–100.0)
Monocytes Absolute: 0.4 10*3/uL (ref 0.1–1.0)
Monocytes Relative: 3 %
NEUTROS PCT: 84 %
Neutro Abs: 10.6 10*3/uL — ABNORMAL HIGH (ref 1.7–7.7)
PLATELETS: 235 10*3/uL (ref 150–400)
RBC: 4.18 MIL/uL (ref 3.87–5.11)
RDW: 14.3 % (ref 11.5–15.5)
WBC: 12.7 10*3/uL — AB (ref 4.0–10.5)

## 2016-07-29 LAB — URINALYSIS, ROUTINE W REFLEX MICROSCOPIC
BILIRUBIN URINE: NEGATIVE
Glucose, UA: NEGATIVE mg/dL
Ketones, ur: NEGATIVE mg/dL
Nitrite: NEGATIVE
PROTEIN: NEGATIVE mg/dL
SPECIFIC GRAVITY, URINE: 1.01 (ref 1.005–1.030)
pH: 6 (ref 5.0–8.0)

## 2016-07-29 LAB — INFLUENZA PANEL BY PCR (TYPE A & B)
H1N1 flu by pcr: NOT DETECTED
Influenza A By PCR: NEGATIVE
Influenza B By PCR: NEGATIVE

## 2016-07-29 MED ORDER — IBUPROFEN 800 MG PO TABS
800.0000 mg | ORAL_TABLET | Freq: Three times a day (TID) | ORAL | Status: DC | PRN
  Administered 2016-07-29: 800 mg via ORAL
  Filled 2016-07-29: qty 1

## 2016-07-29 MED ORDER — CEPHALEXIN 500 MG PO CAPS: 500.0000 mg | ORAL_CAPSULE | Freq: Two times a day (BID) | ORAL | 0 refills | Status: DC

## 2016-07-29 NOTE — Discharge Instructions (Signed)
  Infeccin urinaria  (Urinary Tract Infection)  La infeccin urinaria puede ocurrir en cualquier lugar del tracto urinario. El tracto urinario es un sistema de drenaje del cuerpo por el que se eliminan los desechos y el exceso de agua. El tracto urinario est formado por dos riones, dos urteres, la vejiga y la uretra. Los riones son rganos que tienen forma de frijol. Cada rin tiene aproximadamente el tamao del puo. Estn situados debajo de las costillas, uno a cada lado de la columna vertebral CAUSAS  La causa de la infeccin son los microbios, que son organismos microscpicos, que incluyen hongos, virus, y bacterias. Estos organismos son tan pequeos que slo pueden verse a travs del microscopio. Las bacterias son los microorganismos que ms comnmente causan infecciones urinarias.  SNTOMAS  Los sntomas pueden variar segn la edad y el sexo del paciente y por la ubicacin de la infeccin. Los sntomas en las mujeres jvenes incluyen la necesidad frecuente e intensa de orinar y una sensacin dolorosa de ardor en la vejiga o en la uretra durante la miccin. Las mujeres y los hombres mayores podrn sentir cansancio, temblores y debilidad y sentir dolores musculares y dolor abdominal. Si tiene fiebre, puede significar que la infeccin est en los riones. Otros sntomas son dolor en la espalda o en los lados debajo de las costillas, nuseas y vmitos.  DIAGNSTICO  Para diagnosticar una infeccin urinaria, el mdico le preguntar acerca de sus sntomas. Tambin le solicitar una muestra de orina. La muestra de orina se analiza para detectar bacterias y glbulos blancos de la sangre. Los glbulos blancos se forman en el organismo para ayudar a combatir las infecciones.  TRATAMIENTO  Por lo general, las infecciones urinarias pueden tratarse con medicamentos. Debido a que la mayora de las infecciones son causadas por bacterias, por lo general pueden tratarse con antibiticos. La eleccin del  antibitico y la duracin del tratamiento depender de sus sntomas y el tipo de bacteria causante de la infeccin.  INSTRUCCIONES PARA EL CUIDADO EN EL HOGAR   Si le recetaron antibiticos, tmelos exactamente como su mdico le indique. Termine el medicamento aunque se sienta mejor despus de haber tomado slo algunos.  Beba gran cantidad de lquido para mantener la orina de tono claro o color amarillo plido.  Evite la cafena, el t y las bebidas gaseosas. Estas sustancias irritan la vejiga.  Vaciar la vejiga con frecuencia. Evite retener la orina durante largos perodos.  Vace la vejiga antes y despus de tener relaciones sexuales.  Despus de mover el intestino, las mujeres deben higienizarse la regin perineal desde adelante hacia atrs. Use slo un papel tissue por vez. SOLICITE ATENCIN MDICA SI:   Siente dolor en la espalda.  Le sube la fiebre.  Los sntomas no mejoran luego de 3 das. SOLICITE ATENCIN MDICA DE INMEDIATO SI:   Siente dolor intenso en la espalda o en la zona inferior del abdomen.  Comienza a sentir escalofros.  Tiene nuseas o vmitos.  Tiene una sensacin continua de quemazn o molestias al orinar. ASEGRESE DE QUE:   Comprende estas instrucciones.  Controlar su enfermedad.  Solicitar ayuda de inmediato si no mejora o empeora.   Esta informacin no tiene como fin reemplazar el consejo del mdico. Asegrese de hacerle al mdico cualquier pregunta que tenga.   Document Released: 07/24/2005 Document Revised: 07/08/2012 Elsevier Interactive Patient Education 2016 Elsevier Inc.  

## 2016-07-29 NOTE — MAU Provider Note (Signed)
History     CSN: [id number]  Arrival date and time: 07/29/16 1331   None     Chief Complaint  Patient presents with  . Fever   HPI Patient is s/p SVD two weeks ago presenting with headache, fever, chills since 2 am today. She denies blurry vision, shortness of breath, pain with urination or difficulty with bowel movements. Denies cough, denies nasal congestion. Last bowel movement was yesterday morning. Took 400 of ibuprofen this morning at 3 am. She is pumping and denies any breast pain.  OB History    Gravida Para Term Preterm AB Living   1 1 1  0 0 1   SAB TAB Ectopic Multiple Live Births   0 0 0 0 1      Past Medical History:  Diagnosis Date  . 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 Diabetes mellitus without complication (HCC)   . Heart murmur   . Polycystic ovary syndrome 03/24/2013    Past Surgical History:  Procedure Laterality Date  . CARDIAC SURGERY      Family History  Problem Relation Age of Onset  . Diabetes Mother     Social History  Substance Use Topics  . Smoking status: Passive Smoke Exposure - Never Smoker  . Smokeless tobacco: Never Used  . Alcohol use Yes    Allergies: No Known Allergies  Prescriptions Prior to Admission  Medication Sig Dispense Refill Last Dose  . ibuprofen (ADVIL,MOTRIN) 600 MG tablet Take 1 tablet (600 mg total) by mouth every 6 (six) hours as needed. 30 tablet 0   . Prenatal Vit-Fe Fumarate-FA (MULTIVITAMIN-PRENATAL) 27-0.8 MG TABS tablet Take 1 tablet by mouth daily at 12 noon.   07/13/2016 at Unknown time  . senna-docusate (SENOKOT-S) 8.6-50 MG tablet Take 2 tablets by mouth daily. 30 tablet 0     Review of Systems  Constitutional: Positive for chills and fever.  HENT: Positive for ear pain and sore throat. Negative for congestion.   Eyes: Negative.   Respiratory: Negative for cough and stridor.   Cardiovascular: Negative.   Gastrointestinal: Negative.  Negative for abdominal pain, constipation and  diarrhea.  Genitourinary: Negative.   Musculoskeletal: Negative.   Skin: Negative.   Neurological: Positive for headaches.  Endo/Heme/Allergies: Negative.    Physical Exam   Blood pressure 120/62, pulse 108, temperature 99.9 F (37.7 C), temperature source Oral, resp. rate 20, height 5\' 2"  (1.575 m), weight 204 lb (92.5 kg), last menstrual period 08/31/2015, SpO2 98 %, unknown if currently breastfeeding.  Physical Exam  Constitutional: She is oriented to person, place, and time. She appears well-developed and well-nourished.  HENT:  Head: Normocephalic and atraumatic.  Right Ear: No swelling.  Left Ear: There is swelling. Tympanic membrane is erythematous.  Mouth/Throat: Uvula is midline, oropharynx is clear and moist and mucous membranes are normal. No oropharyngeal exudate, posterior oropharyngeal edema, posterior oropharyngeal erythema or tonsillar abscesses.  Eyes: Pupils are equal, round, and reactive to light.  Neck: Normal range of motion.  Cardiovascular: Normal rate, regular rhythm and normal heart sounds.   Respiratory: Effort normal and breath sounds normal. No stridor. No respiratory distress. She has no wheezes. She has no rales. She exhibits no tenderness.  GI: Soft. She exhibits no distension and no mass. There is no tenderness. There is no rebound and no guarding.  Genitourinary: Vagina normal and uterus normal.  Genitourinary Comments: Cervix is firm and fingertip. No CMT. Fundus is 4 FB below umbilicus.  Vaginal laceration is well-healed. No blood or odor from vagina or laceration. Scant lochia on glove after bimanual exam.   Musculoskeletal: Normal range of motion.  Neurological: She is alert and oriented to person, place, and time.  Skin: Skin is warm and dry.   Breasts are soft, non-tender with no masses or warmth. No tenderness on palpation.  MAU Course  Procedures  iburprofen 800 mg PO x 1.    MDM Labs ordered and reviewed. VS stable; no evidence of  endometritis or mastitis. Influenza and strep pending, although unlikely source of infection. Based on UA will treat for UTI, UC pending.   Assessment and Plan   1. Acute cystitis without hematuria    Discharge home.  Will call patient with positive cultures. Increased water intake, cranberry juice, reviewed when to return to MAU.    Medication List    STOP taking these medications   senna-docusate 8.6-50 MG tablet Commonly known as:  Senokot-S     TAKE these medications   cephALEXin 500 MG capsule Commonly known as:  KEFLEX Take 1 capsule (500 mg total) by mouth 2 (two) times daily.   ibuprofen 600 MG tablet Commonly known as:  ADVIL,MOTRIN Take 1 tablet (600 mg total) by mouth every 6 (six) hours as needed. What changed:  Another medication with the same name was removed. Continue taking this medication, and follow the directions you see here.   multivitamin-prenatal 27-0.8 MG Tabs tablet Take 1 tablet by mouth daily at 12 noon.        Charlesetta Garibaldi Dejour Vos 07/29/2016, 4:10 PM

## 2016-07-29 NOTE — MAU Note (Signed)
Pt. Presents to MAU following fever , chills, H/A x 24 hours. Had vaginal delivery 07/16/18. Pt. States last night at 0200 pm temp 104.

## 2016-07-31 LAB — URINE CULTURE: SPECIAL REQUESTS: NORMAL

## 2016-08-01 ENCOUNTER — Telehealth: Payer: Self-pay | Admitting: Family Medicine

## 2016-08-01 LAB — CULTURE, GROUP A STREP (THRC)

## 2016-08-01 NOTE — Telephone Encounter (Signed)
Reached patient by phone today. She is feeling much better on Keflex, no headache, ear pain, sore throat or cough or temperature.  Given that she has improved, recommended finishing course of Keflex rather than switching to amox. Patient verbalized understanding and will return to MAU if symptoms return.

## 2016-08-08 ENCOUNTER — Encounter: Payer: Self-pay | Admitting: Obstetrics and Gynecology

## 2016-08-08 ENCOUNTER — Ambulatory Visit (INDEPENDENT_AMBULATORY_CARE_PROVIDER_SITE_OTHER): Payer: Self-pay | Admitting: Obstetrics and Gynecology

## 2016-08-08 NOTE — Progress Notes (Signed)
Subjective:     Kimberly Reid is a 25 y.o. female who presents for a postpartum visit. She is 3 weeks postpartum following a spontaneous vaginal delivery. I have fully reviewed the prenatal and intrapartum course. The delivery was at 39.2 gestational weeks (IOL secondary to GDM). Outcome: spontaneous vaginal delivery. Anesthesia: epidural. Postpartum course has been uncomplicated. Baby's course has been uncomplicated. Baby is feeding by breast. Bleeding thin lochia. Bowel function is normal. Bladder function is normal. Patient is not sexually active. Contraception method is abstinence. Postpartum depression screening: negative.     Review of Systems Pertinent items are noted in HPI.   Objective:    LMP 08/31/2015   General:  alert, cooperative and no distress   Breasts:  inspection negative, no nipple discharge or bleeding, no masses or nodularity palpable  Lungs: clear to auscultation bilaterally  Heart:  regular rate and rhythm  Abdomen: soft, non-tender; bowel sounds normal; no masses,  no organomegaly   Vulva:  normal  Vagina: normal vagina, no discharge, exudate, lesion, or erythema  Cervix:  multiparous appearance  Corpus: normal size, contour, position, consistency, mobility, non-tender  Adnexa:  no mass, fullness, tenderness  Rectal Exam: Not performed.        Assessment:     Normal postpartum exam. Pap smear not done at today's visit.   Plan:    1. Contraception: condoms 2. Patient is medically cleared to resume all activities of daily living. Patient with GDM and needs to return in 4 weeks for 2 hr glucola. 3. Follow up in: 4 weeks for 2 hr glucola or as needed.

## 2016-08-08 NOTE — Progress Notes (Deleted)
Subjective:     Kimberly Reid is a 25 y.o. female who presents for a postpartum visit. She is 3 weeks postpartum following a spontaneous vaginal delivery. I have fully reviewed the prenatal and intrapartum course. The delivery was at 39.2 gestational weeks. Outcome: spontaneous vaginal delivery. Anesthesia: epidural. Postpartum course has been unremarkable. Baby's course has been unremarkable. Baby is feeding by breast. Bleeding staining only. Bowel function is normal. Bladder function is normal. Patient is not sexually active. Contraception method is none. Patient is interested in using condoms when active. Depression screening: negative.  {Common ambulatory SmartLinks:19316}  Review of Systems {ros; complete:30496}   Objective:    LMP 08/31/2015   General:  {gen appearance:16600}   Breasts:  {breast exam:1202::"inspection negative, no nipple discharge or bleeding, no masses or nodularity palpable"}  Lungs: {lung exam:16931}  Heart:  {heart exam:5510}  Abdomen: {abdomen exam:16834}   Vulva:  {labia exam:12198}  Vagina: {vagina exam:12200}  Cervix:  {cervix exam:14595}  Corpus: {uterus exam:12215}  Adnexa:  {adnexa exam:12223}  Rectal Exam: {rectal/vaginal exam:12274}        Assessment:    *** postpartum exam. Pap smear {done:10129} at today's visit.   Plan:    1. Contraception: {method:5051} 2. *** 3. Follow up in: {1-10:13787} {time; units:19136} or as needed.

## 2016-08-22 ENCOUNTER — Encounter: Payer: Self-pay | Admitting: Internal Medicine

## 2016-09-05 ENCOUNTER — Other Ambulatory Visit: Payer: Self-pay

## 2016-09-05 DIAGNOSIS — O99814 Abnormal glucose complicating childbirth: Secondary | ICD-10-CM

## 2016-09-06 LAB — GLUCOSE TOLERANCE, 2 HOURS
Glucose, 2 hour: 112 mg/dL (ref <140)
Glucose, Fasting: 81 mg/dL (ref 65–99)

## 2016-09-22 IMAGING — US US OB TRANSVAGINAL
1 series · 15 of 26 positions shown · non-contrast
Comparison: None.

CLINICAL DATA: Followup viability. Quantitative beta HCG on
11/20/2015 was [DATE]. LMP was 08/31/2015.Gestational age by LMP is
15 weeks 0 days.EDC by LMP is 06/06/2016.

EXAM:
TRANSVAGINAL OB ULTRASOUND
TECHNIQUE: Transvaginal ultrasound was performed for complete evaluation of the
gestation as well as the maternal uterus, adnexal regions, and
pelvic cul-de-sac.

[Series 1: us ob transvaginal · 26 acquisitions, 15 frames shown]
[im 1/26]
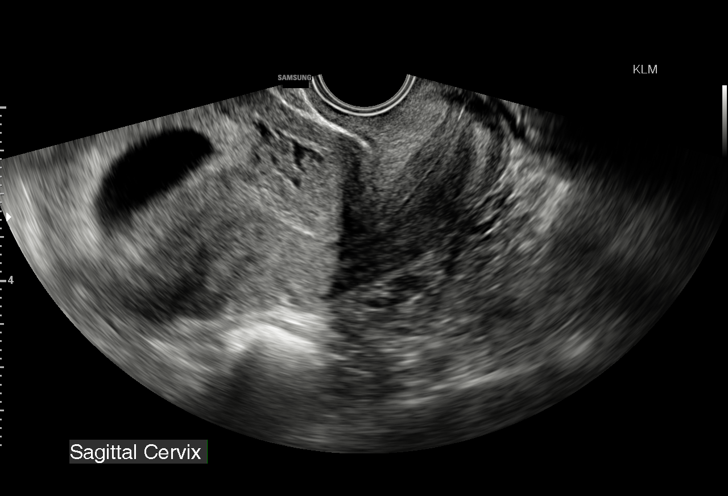
[im 3/26]
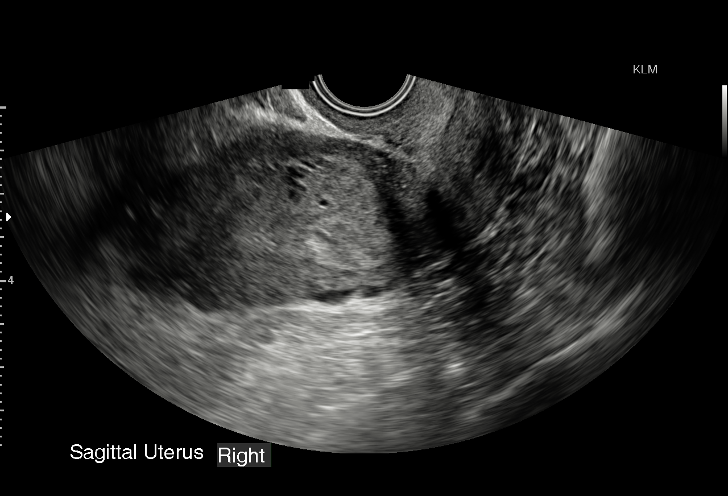
[im 5/26]
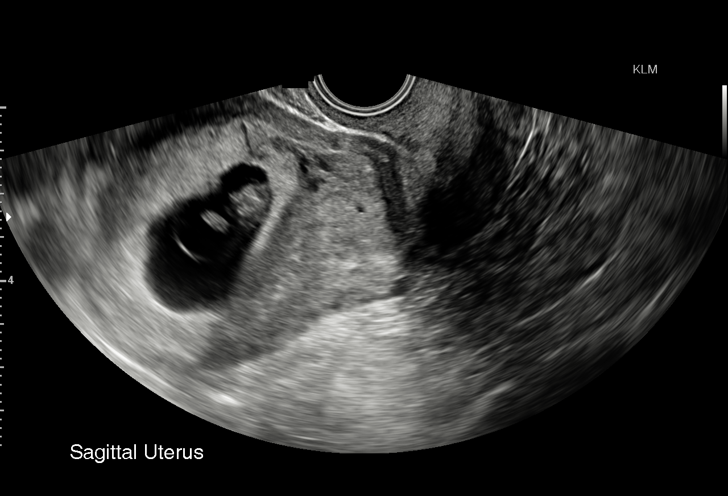
[im 7/26]
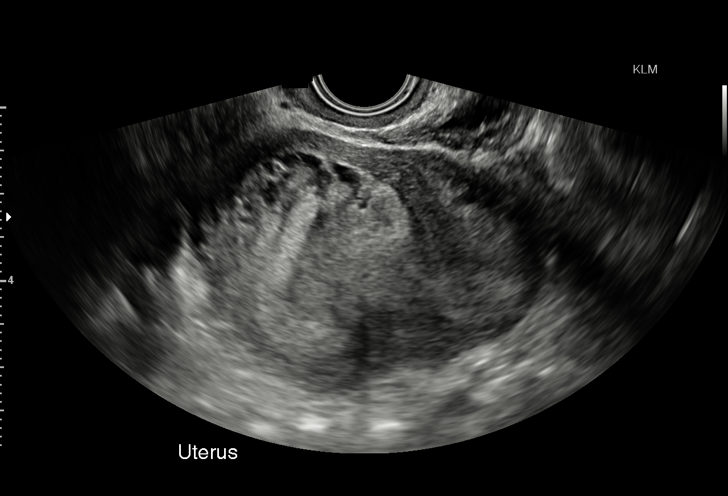
[im 8/26]
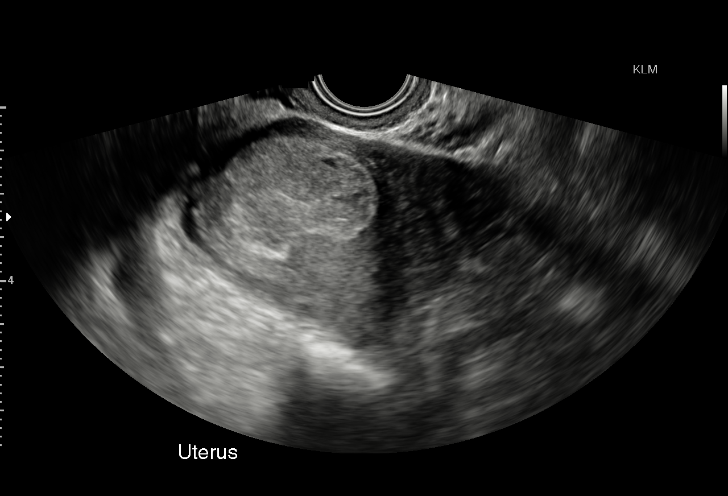
[im 10/26]
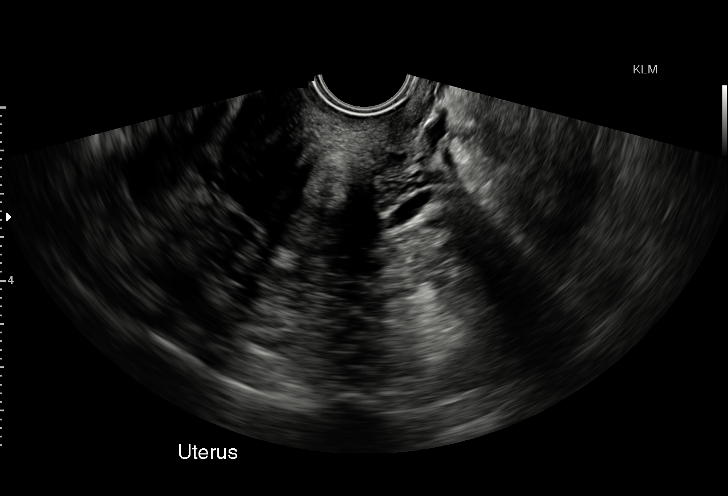
[im 12/26]
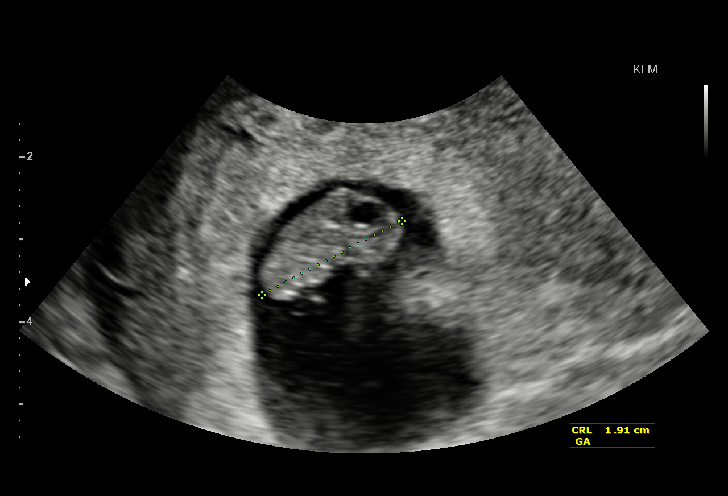
[im 14/26]
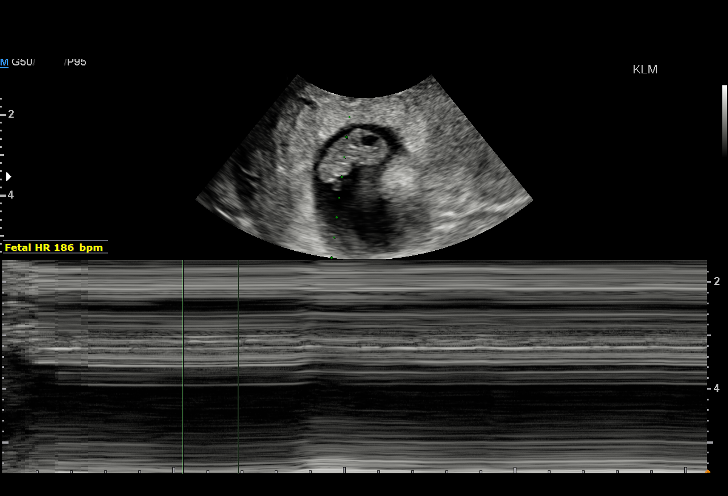
[im 15/26]
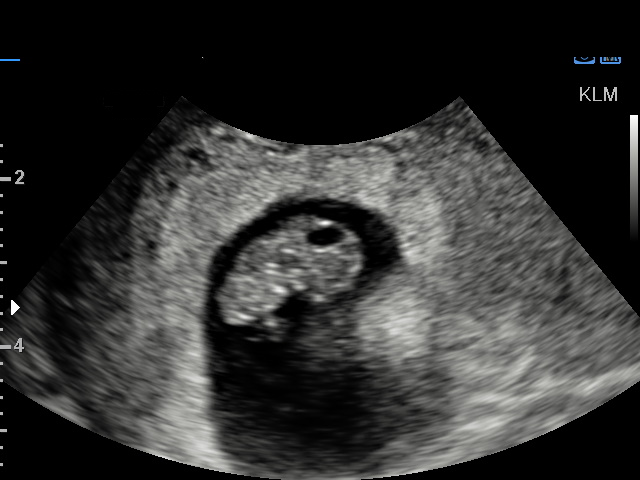
[im 17/26]
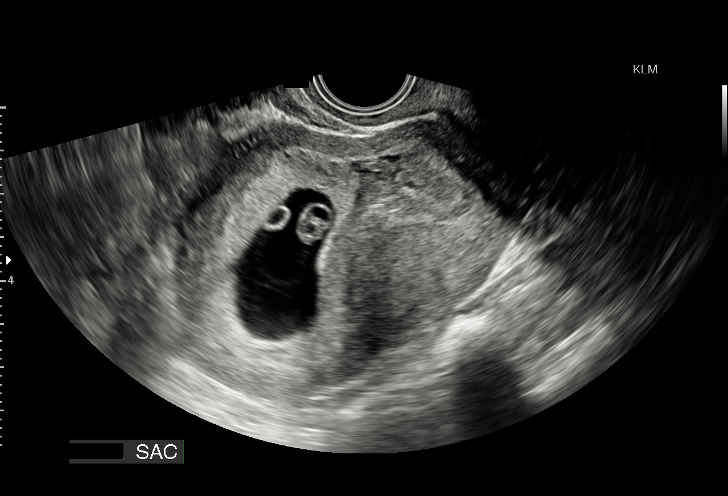
[im 19/26]
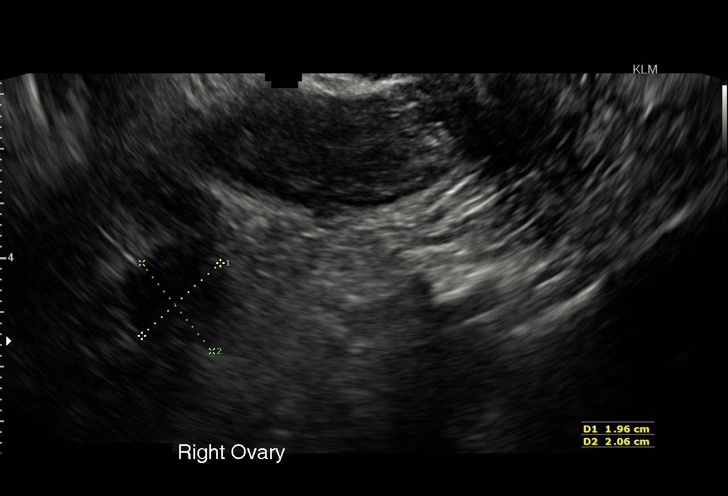
[im 20/26]
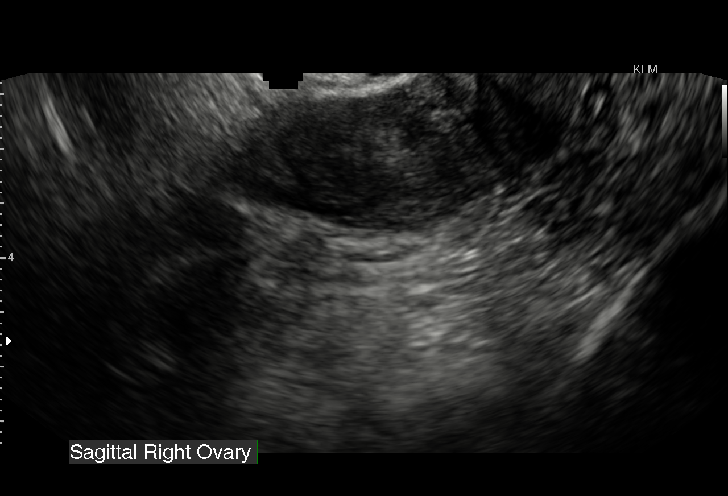
[im 22/26]
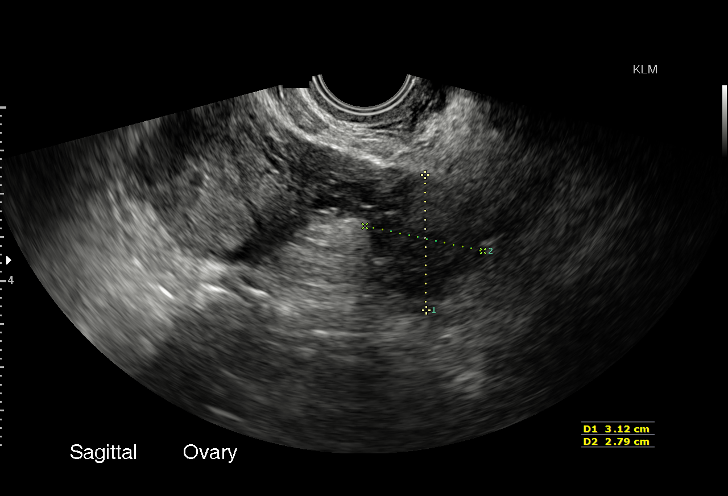
[im 24/26]
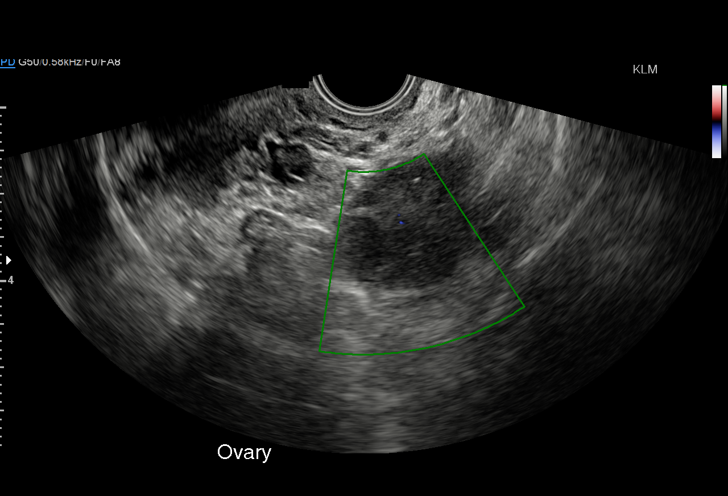
[im 26/26]
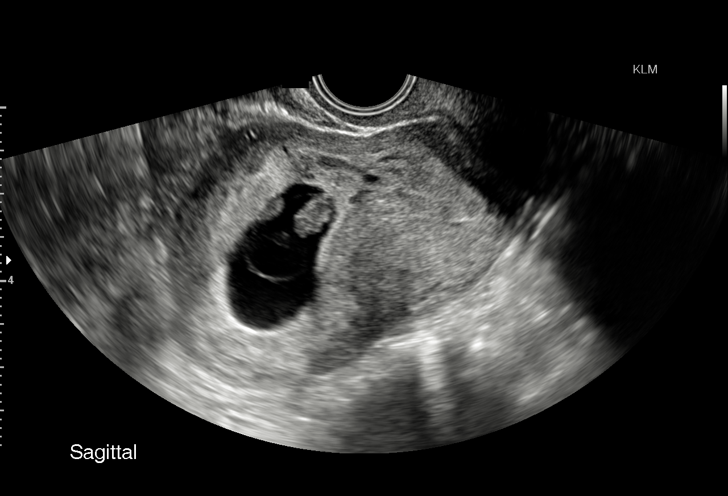

[15 of 26 positions shown; findings below may reference images not displayed]

FINDINGS: Intrauterine gestational sac: Visualized/normal in shape.

Yolk sac:  Present

Embryo:  Present

Cardiac Activity: Present

Heart Rate: 186 bpm

CRL:   19.6  mm   8 w 4 d                  US EDC: 07/21/2016

Subchorionic hemorrhage:  Small subchorionic hemorrhage is present.

Maternal uterus/adnexae: Normal appearing ovaries. No free pelvic
fluid.
IMPRESSION: 1. Single living intrauterine embryo.
2. Imaging and clinical dating differ.
3. By today's exam EDC is 07/21/2016.

## 2017-01-22 ENCOUNTER — Ambulatory Visit: Payer: Self-pay

## 2017-01-29 ENCOUNTER — Ambulatory Visit: Payer: Self-pay | Attending: Internal Medicine

## 2017-04-28 ENCOUNTER — Ambulatory Visit: Payer: Self-pay | Attending: Family Medicine | Admitting: Family Medicine

## 2017-04-28 VITALS — BP 112/70 | HR 63 | Temp 98.4°F | Resp 18 | Ht 63.0 in | Wt 210.4 lb

## 2017-04-28 DIAGNOSIS — Z8742 Personal history of other diseases of the female genital tract: Secondary | ICD-10-CM

## 2017-04-28 DIAGNOSIS — E282 Polycystic ovarian syndrome: Secondary | ICD-10-CM | POA: Insufficient documentation

## 2017-04-28 DIAGNOSIS — N912 Amenorrhea, unspecified: Secondary | ICD-10-CM | POA: Insufficient documentation

## 2017-04-28 DIAGNOSIS — Z79899 Other long term (current) drug therapy: Secondary | ICD-10-CM | POA: Insufficient documentation

## 2017-04-28 DIAGNOSIS — N911 Secondary amenorrhea: Secondary | ICD-10-CM

## 2017-04-28 LAB — POCT URINE PREGNANCY: PREG TEST UR: NEGATIVE

## 2017-04-28 LAB — GLUCOSE, POCT (MANUAL RESULT ENTRY): POC Glucose: 103 mg/dl — AB (ref 70–99)

## 2017-04-28 LAB — POCT GLYCOSYLATED HEMOGLOBIN (HGB A1C): Hemoglobin A1C: 5.4

## 2017-04-28 NOTE — Patient Instructions (Addendum)
Amenorrea secundaria (Secondary Amenorrhea) La amenorrea secundaria es la detencin del flujo menstrual durante 3-6 meses en una mujer que previamente tena sus perodos. Hay muchas causas posibles: La mayora de las causas no son graves. Generalmente, al tratar el problema subyacente que causa la detencin de la [place], podr volver a tener sus perodos normales. CAUSAS Algunas causas comunes de la falta de menstruacin son:  Desnutricin.  Bajo nivel de Banker (hipoglucemia).  Enfermedad poliqustica de los ovarios.  Estrs o miedos.  Aletta Edouard.  Desequilibrio hormonal.  Insuficiencia ovrica.  Medicamentos.  Obesidad extrema.  Fibrosis qustica.  Reduccin de peso drstica por cualquier causa.  Menopausia precoz.  Extirpacin de los ovarios o del tero.  Anticonceptivos.  Enfermedades.  Enfermedades de larga duracin (crnicas).  Sndrome de [place].  Problemas de tiroides.  Pldoras, parches o anillos vaginales para el control de la natalidad. FACTORES DE RIESGO Puede tener ms riesgo de amenorrea secundaria si:  Tiene una historia familiar de este problema.  Sufre un trastorno alimentario.  Realiza entrenamiento deportivo. DIAGNSTICO Este diagnstico la realiza el mdico por medio de la historia clnica y el examen fsico. Incluir un examen plvico para verificar si hay problemas en los rganos reproductores. Debe descartarse la posibilidad de embarazo. Generalmente se indicarn diferentes anlisis de sangre para medir diferentes tipos de hormonas en el organismo. Le indicarn anlisis de [place]. Le harn algunos estudios especializados (ecografas, tomografa computada, resonancia magntica o histeroscopa) y tambin medirn su ndice de masa corporal [facility]). TRATAMIENTO El tratamiento depende de la causa de la [place]. Si hay un trastorno de Psychologist, sport and exercise, deber tratarse con la dieta y la terapia [place]. Los trastornos  crnicos pueden mejorar con el tratamiento de la enfermedad. La amenorrea puede corregirse con medicamentos, cambios en el estilo de vida o con [place]. Si la amenorrea no puede corregirse, algunas veces es posible crear una falsa menstruacin con medicamentos. INSTRUCCIONES PARA EL CUIDADO EN EL HOGAR  Consuma una dieta saludable.  Controle los problemas de [place].  Haga ejercicios con regularidad, pero no excesivamente.  Duerma lo suficiente.  Controle el estrs.  Observe si hay cambios en el ciclo menstrual. Mantenga un registro del momento en que ocurren los perodos. Anote la fecha de inicio de los perodos, cunto duran y si hay problemas. SOLICITE ATENCIN MDICA SI: Los sntomas no mejoran con Scientist, research (medical). Esta informacin no tiene Theme park manager el consejo del mdico. Asegrese de hacerle al mdico cualquier pregunta que tenga. Document Released: 06/16/2013 Document Revised: 11/04/2014 Document Reviewed: 04/01/2013 Elsevier Interactive Patient Education  2018 Elsevier Inc.    Sndrome ovrico poliqustico (Polycystic Ovarian Syndrome) El sndrome ovrico poliqustico (PCOS) es un trastorno hormonal comn en mujeres en edad reproductiva. La mayora de las mujeres con PCOS tienen pequeos sacos llenos de lquido (quistes) que crecen en los ovarios y [place] no son parte del ciclo normal de Building control surveyor. Esto puede ocasionar problemas con los perodos menstruales y dificultades para quedar embarazada. Tambin puede aumentar el riesgo de aborto espontneo. Si no se trata, el sndrome ovrico poliqustico puede acarrear graves problemas de [place], como diabetes y enfermedades del Programmer, multimedia. CAUSAS Se desconoce la causa del sndrome ovrico poliqustico, aunque puede ser la combinacin de ciertos factores, por ejemplo:  Perodos menstruales irregulares.  Niveles altos de ciertas hormonas (andrgenos).  Problemas con las hormonas que ayudan a Chief Operating Officer la glucemia (resistencia a  la insulina).  Ciertos genes. FACTORES DE RIESGO Es ms probable que esta afeccin se produzca en mujeres con  antecedentes familiares de PCOS. SNTOMAS Los sntomas del PCOS pueden incluir lo siguiente:  Mltiples quistes ovricos.  Perodos menstruales irregulares o ausentes.  Perodos menstruales que son muy frecuentes o demasiado intensos.  Perodos menstruales impredecibles.  Incapacidad para quedar embarazada (infertilidad) debido a la falta de ovulacin.  Aumento del crecimiento del vello en el rostro, el trax, el [address], la espalda, los muslos o los dedos de los pies.  Piel grasa o acn. El acn se puede desarrollar durante la Lafe Garinadultez y es posible que no responda a Pharmacist, communityun tratamiento.  Dolor en la pelvis.  Sobrepeso u obesidad.  Manchas de piel gruesa marrn oscura o negra en el cuello, brazos, pechos o caderas (acanthosis nigricans).  Exceso de crecimiento de vello en el rostro, el pecho, el abdomen o la parte superior de las caderas (hirsutismo). DIAGNSTICO Esta afeccin se diagnostica en funcin de lo siguiente:  Sus antecedentes mdicos.  Un examen fsico, incluido un examen plvico. El mdico puede buscar las zonas con crecimiento de vello sobre la piel.  Estudios, por ejemplo: ? Ecografa. Esto puede utilizarse para Entergy Corporationexaminar los ovarios y las paredes del tero (endometrio) en busca de quistes. ? Anlisis de Sterling Heightssangre. Estos se pueden Chemical engineerutilizar para Glass blower/designercontrolar en Genuine Partssangre los niveles de azcar (glucosa), las hormonas masculinas (testosterona) y las hormonas femeninas (estrgeno) y (progesterona). TRATAMIENTO No hay cura para el sndrome ovrico poliqustico, pero un tratamiento puede ayudar a Chief Operating Officercontrolar los sntomas y Automotive engineerevitar que se desarrollen otros problemas de Blancosalud. Los tratamientos varan en funcin de lo siguiente:  Sus sntomas.  Si desea tener un beb o usar un mtodo de control de la natalidad (anticonceptivos). El tratamiento puede incluir cambios en la dieta  y el estilo de vida junto con:  Hormona progesterona, para iniciar un perodo menstrual.  Pldoras anticonceptivas para Orthoptistregularizar los perodos menstruales.  Medicamentos para estimular la ovulacin, si quiere quedar embarazada.  Medicamentos para reducir el crecimiento excesivo de vello.  Kandis BanCiruga, solo The Procter & Gamblecuando los casos son graves. Esta puede consistir en hacer pequeos orificios en uno o ambos ovarios. Esto disminuye la cantidad de testosterona que produce el cuerpo. INSTRUCCIONES PARA EL CUIDADO EN EL HOGAR  Tome los medicamentos de venta libre y los recetados solamente como se lo haya indicado el mdico.  Siga un plan de alimentacin saludable. Esto puede ayudar a Web designerreducir los efectos del sndrome ovrico poliqustico. ? Un plan de alimentacin saludable incluye consumir protenas magras, hidratos de carbono complejos, frutas y verduras frescas, productos lcteos con bajo contenido de [place]the [place]grasa y grasas saludables. Asegrese de Teacher, musiccomer suficiente fibra.  Si tiene sobrepeso, baje de General Electricpeso como se lo haya indicado el mdico. ? Perder el 10% del peso corporal puede mejorar los sntomas. ? El mdico puede determinar cuntos kilos tiene que bajar y Bosworthayudarlo a que adelgace de Wellsite geologistmanera segura.  Concurra a todas las visitas de control como se lo haya indicado el mdico. Esto es importante. SOLICITE ATENCIN MDICA SI:  Los sntomas no mejoran con los United Parcelmedicamentos.  Presenta nuevos sntomas. Esta informacin no tiene Theme park managercomo fin reemplazar el consejo del mdico. Asegrese de hacerle al mdico cualquier pregunta que tenga. Document Released: 01/30/2009 Document Revised: 08/04/2013 Document Reviewed: 03/31/2016 Elsevier Interactive Patient Education  2018 ArvinMeritorElsevier Inc.

## 2017-04-28 NOTE — Progress Notes (Signed)
   Subjective:  Patient ID: Kimberly Reid, female    DOB: [birth year]  Age: 26 y.o. MRN: [id number]  CC: Establish Care   HPI Kimberly Reid presents for complains of amenorrhea. Reports giving birth on 07/16/16.  LPM was 07/22/17 and bleeding lasted for 3 days and stopped. She reports history of abnormal menstrual periods. She reports longest time without period was 5 months. Menstrual cycles typically occur every 3 months and last for about 1 week. Age of menarche 26 years old. History of PCOS. She denies any previous treatments for menstrual abnormalities.   Outpatient Medications Prior to Visit  Medication Sig Dispense Refill  . Prenatal Vit-Fe Fumarate-FA (MULTIVITAMIN-PRENATAL) 27-0.8 MG TABS tablet Take 1 tablet by mouth daily at 12 noon.     No facility-administered medications prior to visit.     ROS Review of Systems  Constitutional: Negative.   Respiratory: Negative.   Cardiovascular: Negative.   Gastrointestinal: Negative.  Negative for abdominal pain.  Genitourinary: Positive for menstrual problem.   Objective:  BP 112/70 (BP Location: Left Arm, Patient Position: Sitting, Cuff Size: Normal)   Pulse 63   Temp 98.4 F (36.9 C) (Oral)   Resp 18   Ht 5\' 3"  (1.6 m)   Wt 210 lb 6.4 oz (95.4 kg)   SpO2 97%   BMI 37.27 kg/m   BP/Weight 04/28/2017 08/08/2016 07/29/2016  Systolic BP 112 119 123  Diastolic BP 70 56 60  Wt. (Lbs) 210.4 205 204  BMI 37.27 37.49 37.31    Physical Exam  Constitutional: She appears well-developed and well-nourished.  Cardiovascular: Normal rate, regular rhythm, normal heart sounds and intact distal pulses.   Pulmonary/Chest: Effort normal and breath sounds normal.  Abdominal: Soft. Bowel sounds are normal. There is no tenderness.  Nursing note and vitals reviewed.  Assessment & Plan:   Problem List Items Addressed This Visit    None    Visit Diagnoses    History of PCOS    -  Primary   Declines oral contraceptives at this  time due to desire to get pregnant.    Relevant Orders   POCT glycosylated hemoglobin (Hb A1C) (Completed)   FSH/LH (Completed)   Secondary amenorrhea       Relevant Orders   POCT urine pregnancy (Completed)   TSH (Completed)   Glucose (CBG) (Completed)      No orders of the defined types were placed in this encounter.   Follow-up: Return As needed.   Lizbeth BarkMandesia R Kiffany Schelling FNP

## 2017-04-28 NOTE — Progress Notes (Signed)
Patient is here no cycle since Sept 2017

## 2017-04-29 LAB — FSH/LH
FSH: 5.3 m[IU]/mL
LH: 16.4 m[IU]/mL

## 2017-04-29 LAB — TSH: TSH: 2.25 u[IU]/mL (ref 0.450–4.500)

## 2017-05-05 ENCOUNTER — Telehealth: Payer: Self-pay

## 2017-05-05 NOTE — Telephone Encounter (Signed)
-----   Message from Lizbeth BarkMandesia R Hairston, FNP sent at 05/05/2017 10:18 AM EDT ----- Thyroid function normal FSH/LH that evaluates ovarian function is normal.

## 2017-05-05 NOTE — Telephone Encounter (Signed)
CMA call regarding lab results   Patient verify DOB  Patient was aware and understood  

## 2017-11-14 ENCOUNTER — Ambulatory Visit: Payer: Self-pay | Attending: Family Medicine

## 2017-11-25 ENCOUNTER — Encounter: Payer: Self-pay | Admitting: Family Medicine

## 2017-12-09 ENCOUNTER — Encounter: Payer: Self-pay | Admitting: Family Medicine

## 2018-01-26 ENCOUNTER — Ambulatory Visit: Payer: Self-pay | Attending: Nurse Practitioner | Admitting: Nurse Practitioner

## 2018-01-26 ENCOUNTER — Encounter: Payer: Self-pay | Admitting: Nurse Practitioner

## 2018-01-26 VITALS — BP 115/77 | HR 60 | Temp 99.0°F | Ht 63.0 in | Wt 198.4 lb

## 2018-01-26 DIAGNOSIS — Z0000 Encounter for general adult medical examination without abnormal findings: Secondary | ICD-10-CM | POA: Insufficient documentation

## 2018-01-26 DIAGNOSIS — Z8632 Personal history of gestational diabetes: Secondary | ICD-10-CM | POA: Insufficient documentation

## 2018-01-26 DIAGNOSIS — N921 Excessive and frequent menstruation with irregular cycle: Secondary | ICD-10-CM | POA: Insufficient documentation

## 2018-01-26 DIAGNOSIS — E282 Polycystic ovarian syndrome: Secondary | ICD-10-CM | POA: Insufficient documentation

## 2018-01-26 LAB — POCT URINE PREGNANCY: Preg Test, Ur: NEGATIVE

## 2018-01-26 NOTE — Patient Instructions (Signed)
Sndrome ovrico poliqustico (Polycystic Ovarian Syndrome) El sndrome ovrico poliqustico (PCOS) es un trastorno hormonal comn en mujeres en edad reproductiva. La mayora de las mujeres con PCOS tienen pequeos sacos llenos de lquido (quistes) que crecen en los ovarios y [place] no son parte del ciclo normal de Building control surveyor. Esto puede ocasionar problemas con los perodos menstruales y dificultades para quedar embarazada. Tambin puede aumentar el riesgo de aborto espontneo. Si no se trata, el sndrome ovrico poliqustico puede acarrear graves problemas de [place], como diabetes y enfermedades del Programmer, multimedia. CAUSAS Se desconoce la causa del sndrome ovrico poliqustico, aunque puede ser la combinacin de ciertos factores, por ejemplo:  Perodos menstruales irregulares.  Niveles altos de ciertas hormonas (andrgenos).  Problemas con las hormonas que ayudan a Chief Operating Officer la glucemia (resistencia a la insulina).  Ciertos genes. FACTORES DE RIESGO Es ms probable que esta afeccin se produzca en mujeres con antecedentes familiares de PCOS. SNTOMAS Los sntomas del PCOS pueden incluir lo siguiente:  Mltiples quistes ovricos.  Perodos menstruales irregulares o ausentes.  Perodos menstruales que son muy frecuentes o demasiado intensos.  Perodos menstruales impredecibles.  Incapacidad para quedar embarazada (infertilidad) debido a la falta de ovulacin.  Aumento del crecimiento del vello en el rostro, el trax, el [address], la espalda, los muslos o los dedos de los pies.  Piel grasa o acn. El acn se puede desarrollar durante la Lafe Garin y es posible que no responda a Pharmacist, community.  Dolor en la pelvis.  Sobrepeso u obesidad.  Manchas de piel gruesa marrn oscura o negra en el cuello, brazos, pechos o caderas (acanthosis nigricans).  Exceso de crecimiento de vello en el rostro, el pecho, el abdomen o la parte superior de las caderas (hirsutismo). DIAGNSTICO Esta afeccin se  diagnostica en funcin de lo siguiente:  Sus antecedentes mdicos.  Un examen fsico, incluido un examen plvico. El mdico puede buscar las zonas con crecimiento de vello sobre la piel.  Estudios, por ejemplo: ? Ecografa. Esto puede utilizarse para Enter[employer] Corporation ovarios y las paredes del tero (endometrio) en busca de quistes. ? Anlisis de [place]. Estos se pueden Chemical engineer para Glass blower/designer [employer] niveles de azcar (glucosa), las hormonas masculinas (testosterona) y las hormonas femeninas (estrgeno) y (progesterona). TRATAMIENTO No hay cura para el sndrome ovrico poliqustico, pero un tratamiento puede ayudar a Chief Operating Officer los sntomas y Automotive engineer que se desarrollen otros problemas de [place]. Los tratamientos varan en funcin de lo siguiente:  Sus sntomas.  Si desea tener un beb o usar un mtodo de control de la natalidad (anticonceptivos). El tratamiento puede incluir cambios en la dieta y el estilo de vida junto con:  Hormona progesterona, para iniciar un perodo menstrual.  Pldoras anticonceptivas para Orthoptist.  Medicamentos para estimular la ovulacin, si quiere quedar embarazada.  Medicamentos para reducir el crecimiento excesivo de vello.  Kandis Ban, solo The Pro[employer] Gam[employer] son graves. Esta puede consistir en hacer pequeos orificios en uno o ambos ovarios. Esto disminuye la cantidad de testosterona que produce el cuerpo. INSTRUCCIONES PARA EL CUIDADO EN EL HOGAR  Tome los medicamentos de venta libre y los recetados solamente como se lo haya indicado el mdico.  Siga un plan de alimentacin saludable. Esto puede ayudar a Web designer del sndrome ovrico poliqustico. ? Un plan de alimentacin saludable incluye consumir protenas magras, hidratos de carbono complejos, frutas y verduras frescas, productos lcteos con bajo contenido de [place] y grasas saludables. Asegrese de Teacher, music.  Si tiene sobrepeso, baje de [place]  se lo  haya indicado el mdico. ? Perder el 10% del peso corporal puede mejorar los sntomas. ? El mdico puede determinar cuntos kilos tiene que bajar y Richardtonayudarlo a que adelgace de Wellsite geologistmanera segura.  Concurra a todas las visitas de control como se lo haya indicado el mdico. Esto es importante. SOLICITE ATENCIN MDICA SI:  Los sntomas no mejoran con los United Parcelmedicamentos.  Presenta nuevos sntomas. Esta informacin no tiene Theme park managercomo fin reemplazar el consejo del mdico. Asegrese de hacerle al mdico cualquier pregunta que tenga. Document Released: 01/30/2009 Document Revised: 08/04/2013 Document Reviewed: 03/31/2016 Elsevier Interactive Patient Education  2018 Elsevier Inc.  Dieta para el sndrome ovrico poliqustico (Diet for Polycystic Ovarian Syndrome) El sndrome ovrico poliqustico (SOP) es un trastorno de los mensajeros qumicos (hormonas) que Charity fundraiserregulan la menstruacin. La afeccin causa el desequilibrio de hormonas importantes. El SOP puede causar lo siguiente:  Menstruaciones irregulares o la ausencia de la Cheweymenstruacin.  El desarrollo de quistes en los ovarios.  Dificultad para quedar embarazada.  La inhibicin de la respuesta del organismo a los efectos de la insulina (resistencia a la [place] Crystalinsulina), lo que puede derivar en obesidad y diabetes. Modificar la dieta puede ayudar a Retail bankermantener el SOP bajo control y a Scientist, clinical (histocompatibility and immunogenetics)mejorar su estado de Doversalud. Puede ser de ayuda para bajar de peso y Scientist, clinical (histocompatibility and immunogenetics)mejorar la forma en que el organismo hace uso de la insulina. EN QU CONSISTE EL PLAN?  [place], almuerce y cene, y [address] Rdagregue dos colaciones diarias.  Incluya protenas en cada comida y colacin.  Elija los Counselling psychologistcereales integrales en lugar de los productos elaborados con Icelandharina refinada.  Consuma diferentes alimentos.  Haga actividad fsica habitualmente como se lo haya indicado el mdico. QU DEBO SABER ACERCA DE ESTE PLAN DE ALIMENTACIN? Si tiene sobrepeso u obesidad, preste atencin a la cantidad de USAAcaloras que  ingiere. Reducir las caloras puede ayudarlo a Publishing copybajar de Pierrepeso. Trabaje con el mdico o el nutricionista para saber cuntas caloras Psychologist, forensicnecesita cada da. QU ALIMENTOS PUEDO COMER? Cereales Cereales integrales, como salvado. Panes, galletitas, cereales y pastas integrales. Avena sin endulzar, trigo, Qatarcebada, quinua o arroz integral. Tortillas de harina de maz o de salvado. Hoover BrunetteVerduras Deatra JamesLechuga. Espinaca. Guisantes. Remolachas. Coliflor. Repollo. Brcoli. Zanahorias. Tomates. Calabaza. Christella NoaBerenjena. Hierbas. Pimienta. Cebollas. Pepinos. Repollitos de Bruselas. Frutas Frutos rojos. Bananas. Manzanas. Naranjas. Uvas. Papaya. Mango. [place]. Kiwi. Pomelo. Cerezas. Carnes y [address]otras fuentes de protenas Protenas magras, como pescado, pollo, frijoles, huevos y tofu. Lcteos Productos lcteos con bajo contenido de East Griffingrasa, como Northvilleleche descremada, palitos de queso y Dentistyogur. Bebidas Bebidas con bajo contenido de grasa o sin Kelvin Cellargrasa, como agua, leche semidescremada, Minnesotabebidas sin azcar y jugo 100% de frutas. Condimentos [place]. Mostaza. Salsa barbacoa. Salsa de pepinillos. Mayonesa con bajo contenido de grasa o sin grasa. Grasas y aceites Aceite de oliva o de canola. Nueces y Hondahalmendras. Los artculos mencionados arriba pueden no ser Raytheonuna lista completa de las bebidas o los alimentos recomendados. Comunquese con el nutricionista para conocer ms opciones. QU ALIMENTOS NO SE RECOMIENDAN? Alimentos con alto contenido de caloras o Holiday representativegrasa. Comidas fritas. Dulces. Los productos elaborados con harina blanca refinada, entre ellos, pan blanco, pasteles, arroz blanco y pastas. Los artculos mencionados arriba pueden no ser Raytheonuna lista completa de las bebidas y los alimentos que se Theatre stage managerdeben evitar. Comunquese con el nutricionista para obtener ms informacin. Esta informacin no tiene Theme park managercomo fin reemplazar el consejo del mdico. Asegrese de hacerle al mdico cualquier pregunta que tenga. Document Released: 02/05/2016 Document  Revised: 02/05/2016 Document Reviewed: 10/26/2014 Elsevier Interactive  Patient Education  2018 Elsevier Inc.  

## 2018-01-26 NOTE — Progress Notes (Signed)
Assessment & Plan:  Kimberly Reid was seen today for establish care and menstrual problem.  Diagnoses and all orders for this visit:  Metrorrhagia -     CBC -     POCT urine pregnancy  History of gestational diabetes -     Hemoglobin A1c  Routine adult health maintenance -     Basic metabolic panel -     Lipid panel -     Microalbumin / creatinine urine ratio -     VITAMIN D 25 Hydroxy (Vit-D Deficiency, Fractures)    Patient has been counseled on age-appropriate routine health concerns for screening and prevention. These are reviewed and up-to-date. Referrals have been placed accordingly. Immunizations are up-to-date or declined.    Subjective:   Chief Complaint  Patient presents with  . Establish Care    Pt. is here to establish care.   . Menstrual Problem    Patient stated her period hasn't come for two months.    HPI Kimberly Reid 27 y.o. female presents to office today to establish care.  Abnormal Uterine Bleeding Patient complains of abnormal menstrual symptoms and PCOS. Symptoms are chronic in nature. Patient describes symptoms of  migraine headaches (mild) and increased acne vulgaris, metrorrhagia. Symptoms occur erratically during the cycle. Patient denies anxiety and insomnia. Evaluation to date includes GYN evaluation (last PAP 2017-normal).  Treatment to date includes metformin for PCOS. The patient is sexually active. She delivered on 07-16-2016. She stopped breastfeeding March 2018. Her first menstrual cycle since March 2018 was January 2019. It was a regular menstrual cycle lasting for a week. She does have a history of metrorrhagia with amenorrhea for up to 3 months. She is concerned that she did not have a menstrual cycle between March and January. I explained to her that the delay in her menstrual cycle could have been related to breast feeding as well as her history of metrorrhagia and PCOS. She took a UPT 2 weeks ago which was negative. She denies any  abdominal pain at this time.   Review of Systems  Constitutional: Negative for fever, malaise/fatigue and weight loss.  HENT: Negative.  Negative for nosebleeds.   Eyes: Negative.  Negative for blurred vision, double vision and photophobia.  Respiratory: Negative.  Negative for cough and shortness of breath.   Cardiovascular: Negative.  Negative for chest pain, palpitations and leg swelling.  Gastrointestinal: Negative.  Negative for heartburn, nausea and vomiting.  Genitourinary:       SEE HPI  Musculoskeletal: Negative.  Negative for myalgias.  Neurological: Negative.  Negative for dizziness, focal weakness, seizures and headaches.  Psychiatric/Behavioral: Negative.  Negative for suicidal ideas.    Past Medical History:  Diagnosis Date  . 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 Gestational diabetes   . Heart murmur   . Polycystic ovary syndrome 03/24/2013    Past Surgical History:  Procedure Laterality Date  . CARDIAC SURGERY      Family History  Problem Relation Age of Onset  . Diabetes Mother     Social History Reviewed with no changes to be made today.   Outpatient Medications Prior to Visit  Medication Sig Dispense Refill  . Prenatal Vit-Fe Fumarate-FA (MULTIVITAMIN-PRENATAL) 27-0.8 MG TABS tablet Take 1 tablet by mouth daily at 12 noon.     No facility-administered medications prior to visit.     No Known Allergies     Objective:    BP 115/77 (BP Location: Left Arm,  Patient Position: Sitting, Cuff Size: Normal)   Pulse 60   Temp 99 F (37.2 C) (Oral)   Ht 5\' 3"  (1.6 m)   Wt 198 lb 6.4 oz (90 kg)   LMP 11/17/2017   SpO2 97%   BMI 35.14 kg/m  Wt Readings from Last 3 Encounters:  01/26/18 198 lb 6.4 oz (90 kg)  04/28/17 210 lb 6.4 oz (95.4 kg)  08/08/16 205 lb (93 kg)    Physical Exam  Constitutional: She is oriented to person, place, and time. She appears well-developed and well-nourished. She is cooperative.  HENT:  Head:  Normocephalic and atraumatic.  Eyes: EOM are normal.  Neck: Normal range of motion.  Cardiovascular: Normal rate, regular rhythm, normal heart sounds and intact distal pulses. Exam reveals no gallop and no friction rub.  No murmur heard. Pulmonary/Chest: Effort normal and breath sounds normal. No tachypnea. No respiratory distress. She has no decreased breath sounds. She has no wheezes. She has no rhonchi. She has no rales. She exhibits no tenderness.  Abdominal: Soft. Bowel sounds are normal.  Musculoskeletal: Normal range of motion. She exhibits no edema.  Neurological: She is alert and oriented to person, place, and time. Coordination normal.  Skin: Skin is warm and dry.  Psychiatric: She has a normal mood and affect. Her behavior is normal. Judgment and thought content normal.  Nursing note and vitals reviewed.      Patient has been counseled extensively about nutrition and exercise as well as the importance of adherence with medications and regular follow-up. The patient was given clear instructions to go to ER or return to medical center if symptoms don't improve, worsen or new problems develop. The patient verbalized understanding.   Follow-up: Return for Physical ONLY no labs.   Claiborne RiggZelda W Fleming, FNP-BC [facility] [place], [place] [phone]   01/26/2018, 3:06 PM

## 2018-01-27 ENCOUNTER — Telehealth: Payer: Self-pay

## 2018-01-27 LAB — BASIC METABOLIC PANEL
BUN / CREAT RATIO: 19 (ref 9–23)
BUN: 10 mg/dL (ref 6–20)
CO2: 23 mmol/L (ref 20–29)
CREATININE: 0.54 mg/dL — AB (ref 0.57–1.00)
Calcium: 9.4 mg/dL (ref 8.7–10.2)
Chloride: 99 mmol/L (ref 96–106)
GFR, EST AFRICAN AMERICAN: 150 mL/min/{1.73_m2} (ref >59)
GFR, EST NON AFRICAN AMERICAN: 130 mL/min/{1.73_m2} (ref >59)
Glucose: 81 mg/dL (ref 65–99)
Potassium: 4 mmol/L (ref 3.5–5.2)
Sodium: 139 mmol/L (ref 134–144)

## 2018-01-27 LAB — HEMOGLOBIN A1C
Est. average glucose Bld gHb Est-mCnc: 103 mg/dL
Hgb A1c MFr Bld: 5.2 % (ref 4.8–5.6)

## 2018-01-27 LAB — CBC
Hematocrit: 40.5 % (ref 34.0–46.6)
Hemoglobin: 13.7 g/dL (ref 11.1–15.9)
MCH: 28.1 pg (ref 26.6–33.0)
MCHC: 33.8 g/dL (ref 31.5–35.7)
MCV: 83 fL (ref 79–97)
PLATELETS: 269 10*3/uL (ref 150–379)
RBC: 4.88 x10E6/uL (ref 3.77–5.28)
RDW: 14 % (ref 12.3–15.4)
WBC: 7.5 10*3/uL (ref 3.4–10.8)

## 2018-01-27 LAB — VITAMIN D 25 HYDROXY (VIT D DEFICIENCY, FRACTURES): Vit D, 25-Hydroxy: 16.8 ng/mL — ABNORMAL LOW (ref 30.0–100.0)

## 2018-01-27 LAB — LIPID PANEL
CHOL/HDL RATIO: 3.4 ratio (ref 0.0–4.4)
Cholesterol, Total: 148 mg/dL (ref 100–199)
HDL: 44 mg/dL (ref >39)
LDL CALC: 81 mg/dL (ref 0–99)
Triglycerides: 114 mg/dL (ref 0–149)
VLDL Cholesterol Cal: 23 mg/dL (ref 5–40)

## 2018-01-27 LAB — MICROALBUMIN / CREATININE URINE RATIO
CREATININE, UR: 94.6 mg/dL
MICROALB/CREAT RATIO: 4.4 mg/g{creat} (ref 0.0–30.0)
Microalbumin, Urine: 4.2 ug/mL

## 2018-01-27 NOTE — Telephone Encounter (Signed)
-----   Message from Guy Francoravia Benjamin, RN sent at 01/27/2018  4:16 PM EDT -----   ----- Message ----- From: Claiborne RiggFleming, Zelda W, NP Sent: 01/26/2018   4:08 PM To: Vidal SchwalbeBien Yy, CMA  Pregnancy test is negative

## 2018-01-27 NOTE — Telephone Encounter (Signed)
CMA call regarding lab results   Patient verify DOB  Patient was aware and understood  

## 2018-01-27 NOTE — Progress Notes (Signed)
Will route to PCP 

## 2018-01-27 NOTE — Progress Notes (Signed)
Patient is asking for acne cream that the pcp was going to prescribe & some Birth control pills

## 2018-01-30 ENCOUNTER — Telehealth: Payer: Self-pay

## 2018-01-30 ENCOUNTER — Other Ambulatory Visit: Payer: Self-pay | Admitting: Nurse Practitioner

## 2018-01-30 MED ORDER — NORETHIN ACE-ETH ESTRAD-FE 1.5-30 MG-MCG PO TABS: 1.0000 | ORAL_TABLET | Freq: Every day | ORAL | 11 refills | Status: DC

## 2018-01-30 NOTE — Telephone Encounter (Signed)
CMA call regarding birth control pills been sent to pharmacy and advice about acne   Patient did not answer & no VM set up

## 2018-01-30 NOTE — Progress Notes (Signed)
[facility] Let's first see if this will help with her acne and if not she can call back next month for acne cream. Until then she can use an OTC acne wash or acne cleaning pads with benzoyl peroxide.

## 2018-02-02 ENCOUNTER — Other Ambulatory Visit: Payer: Self-pay

## 2018-02-02 ENCOUNTER — Telehealth: Payer: Self-pay

## 2018-02-02 NOTE — Telephone Encounter (Signed)
Patient return CMA call   Patient was aware and understood that she has RX to be pick up at the pharmacy

## 2018-02-02 NOTE — Telephone Encounter (Signed)
CMA call second attempt regarding birth control pills been sent to pharmacy and advice about acne    Patient did not answer & no VM set up

## 2018-02-02 NOTE — Progress Notes (Unsigned)
JA 

## 2018-02-03 ENCOUNTER — Telehealth: Payer: Self-pay

## 2018-02-03 NOTE — Telephone Encounter (Signed)
-----   Message from Claiborne RiggZelda W Fleming, NP sent at 01/28/2018  9:51 PM EDT ----- Vitamin D is only slightly low. You can take 1000units OTC of vitamin D. Your A1c does not show diabetes or prediabetes. All of your other labs are normal as well

## 2018-02-03 NOTE — Telephone Encounter (Signed)
CMA called patient and spoke to patient's sister who is on patient DPR form.  Patient's sister was informed on results and will inform patient.

## 2018-02-17 MED FILL — LARIN FE 1.5-30 TABLET: 1.5-30 | 28 days supply | Qty: 28 | Fill #0

## 2018-02-18 MED FILL — AMOX-CLAV 875-125 MG TABLET: 875-125 | 10 days supply | Qty: 20 | Fill #0

## 2018-02-18 MED FILL — MECLIZINE 25 MG TABLET: 25 | 10 days supply | Qty: 30 | Fill #0

## 2018-03-30 ENCOUNTER — Encounter: Payer: Self-pay | Admitting: Nurse Practitioner

## 2018-05-18 MED FILL — LARIN FE 1.5-30 TABLET: 1.5-30 | 28 days supply | Qty: 28 | Fill #1

## 2018-05-25 ENCOUNTER — Ambulatory Visit: Payer: Self-pay

## 2018-06-03 ENCOUNTER — Ambulatory Visit: Payer: Self-pay | Attending: Family Medicine

## 2018-06-10 ENCOUNTER — Ambulatory Visit: Payer: Self-pay | Attending: Family Medicine | Admitting: Physician Assistant

## 2018-06-10 VITALS — BP 114/82 | HR 66 | Temp 98.7°F | Resp 18 | Ht 63.0 in | Wt 184.0 lb

## 2018-06-10 DIAGNOSIS — E282 Polycystic ovarian syndrome: Secondary | ICD-10-CM | POA: Insufficient documentation

## 2018-06-10 DIAGNOSIS — Z789 Other specified health status: Secondary | ICD-10-CM

## 2018-06-10 DIAGNOSIS — H9201 Otalgia, right ear: Secondary | ICD-10-CM | POA: Insufficient documentation

## 2018-06-10 DIAGNOSIS — Q249 Congenital malformation of heart, unspecified: Secondary | ICD-10-CM | POA: Insufficient documentation

## 2018-06-10 DIAGNOSIS — R739 Hyperglycemia, unspecified: Secondary | ICD-10-CM | POA: Insufficient documentation

## 2018-06-10 DIAGNOSIS — Z8632 Personal history of gestational diabetes: Secondary | ICD-10-CM | POA: Insufficient documentation

## 2018-06-10 DIAGNOSIS — H60331 Swimmer's ear, right ear: Secondary | ICD-10-CM | POA: Insufficient documentation

## 2018-06-10 LAB — GLUCOSE, POCT (MANUAL RESULT ENTRY): POC Glucose: 114 mg/dl — AB (ref 70–99)

## 2018-06-10 MED ORDER — NEOMYCIN-POLYMYXIN-HC 3.5-10000-1 OT SOLN: 3.0000 [drp] | Freq: Four times a day (QID) | OTIC | 0 refills | Status: DC

## 2018-06-10 MED FILL — NEO/POLYMYXIN/HC EAR SOLN: 3.5-10000-1 | 14 days supply | Qty: 10 | Fill #0

## 2018-06-10 MED FILL — LARIN FE 1.5-30 TABLET: 1.5-30 | 28 days supply | Qty: 28 | Fill #2

## 2018-06-10 NOTE — Patient Instructions (Signed)
Otitis externa  (Otitis Externa)  La otitis externa es una infeccin del canal auditivo externo. El canal auditivo externo se ubica entre la parte externa del odo y la membrana del tmpano. A veces a la otitis externa se la conoce como "odo del nadador".  CUIDADOS EN EL HOGAR   Si le indicaron que se coloque gotas para los odos con antibitico, pngaselas como se lo haya indicado el mdico. No deje de usarlas aunque la afeccin mejore.   Tome los medicamentos de venta libre y los recetados solamente como se lo haya indicado el mdico.   Concurra a todas las visitas de control como se lo haya indicado el mdico. Esto es importante.  PREVENCIN   Mantenga el odo seco. Use la punta de una toalla para secarse el odo despus de nadar o darse un bao.   Intente no rascarse ni ponerse cosas en el odo. Esto facilita el ingreso de los grmenes al odo.   No nade en lagos, agua sucia o piscinas que pueden no contener la cantidad correcta de una sustancia qumica llamada cloro.   Considere la posibilidad de preparar gotas ticas y ponerse 3 o 4gotas en cada odo despus de nadar. Pregntele al mdico cmo puede preparar las gotas para los odos.  SOLICITE AYUDA SI:   Tiene fiebre.   El odo sigue estando rojo, hinchado o le duele despus de 3das.   Sigue teniendo secrecin de pus despus de 3das.   El dolor, la hinchazn o el enrojecimiento empeoran.   Siente un dolor de cabeza muy intenso.   Tiene enrojecimiento, hinchazn, dolor o sensibilidad detrs de la oreja.  Esta informacin no tiene como fin reemplazar el consejo del mdico. Asegrese de hacerle al mdico cualquier pregunta que tenga.  Document Released: 01/06/2012 Document Revised: 11/04/2014 Document Reviewed: 07/24/2015  Elsevier Interactive Patient Education  2018 Elsevier Inc.

## 2018-06-10 NOTE — Progress Notes (Signed)
Patient ID: Kimberly Reid, female   DOB: 03/24/1991, 27 y.o.   MRN: [id number]      Kimberly Reid, is a 27 y.o. female  [id number]  [id number]  DOB - 03/24/1991  Subjective:  Chief Complaint and HPI: Kimberly Reid is a 27 y.o. female here today for R ear pain.  Decreased hearing and pain for 3 days.  No recent swimming or hair treatments/water exposure.  No f/c.  No URI s/sx  Eulis FosterAlberto with [employer] interpreters translating.    ROS:   Constitutional:  No f/c, No night sweats, No unexplained weight loss. EENT:  No vision changes, No blurry vision, No hearing changes. No mouth, throat problem.  Respiratory: No cough, No SOB Cardiac: No CP, no palpitations GI:  No abd pain, No N/V/D. GU: No Urinary s/sx Musculoskeletal: No joint pain Neuro: No headache, no dizziness, no motor weakness.  Skin: No rash Endocrine:  No polydipsia. No polyuria.  Psych: Denies SI/HI  No problems updated.  ALLERGIES: No Known Allergies  PAST MEDICAL HISTORY: Past Medical History:  Diagnosis Date  . 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 Gestational diabetes   . Heart murmur   . Polycystic ovary syndrome 03/24/2013    MEDICATIONS AT HOME: Prior to Admission medications   Medication Sig Start Date End Date Taking? Authorizing Provider  norethindrone-ethinyl estradiol-iron (MICROGESTIN FE 1.5/30) 1.5-30 MG-MCG tablet Take 1 tablet by mouth daily. 01/30/18  Yes Claiborne RiggFleming, Zelda W, NP  neomycin-polymyxin-hydrocortisone (CORTISPORIN) OTIC solution Place 3 drops into the right ear 4 (four) times daily. 06/10/18   Anders SimmondsMcClung, Elayna Tobler M, PA-C     Objective:  EXAM:   Vitals:   06/10/18 1516  BP: 114/82  Pulse: 66  Resp: 18  Temp: 98.7 F (37.1 C)  TempSrc: Oral  SpO2: 98%  Weight: 184 lb (83.5 kg)  Height: 5\' 3"  (1.6 m)    General appearance : A&OX3. NAD. Non-toxic-appearing HEENT: Atraumatic and Normocephalic.  PERRLA. EOM intact.  L canl and TM WNL.  R  canal ~60% swollen and erythematous.  +pain with manipulation of pinna.  Only able to visualize part of TM which appears normal Mouth-MMM, post pharynx WNL w/o erythema, No PND. Neck: supple, no JVD. No cervical lymphadenopathy. No thyromegaly Chest/Lungs:  Breathing-non-labored, Good air entry bilaterally, breath sounds normal without rales, rhonchi, or wheezing  CVS: S1 S2 regular, no murmurs, gallops, rubs  Extremities: Bilateral Lower Ext shows no edema, both legs are warm to touch with = pulse throughout Neurology:  CN II-XII grossly intact, Non focal.   Psych:  TP linear. J/I WNL. Normal speech. Appropriate eye contact and affect.  Skin:  No Rash  Data Review Lab Results  Component Value Date   HGBA1C 5.2 01/26/2018   HGBA1C 5.4 04/28/2017   HGBA1C 5.1% 05/26/2013     Assessment & Plan   1. Acute swimmer's ear of right side She may end up requiring a wick-I explained this and to go to UC or ED if worsens - neomycin-polymyxin-hydrocortisone (CORTISPORIN) OTIC solution; Place 3 drops into the right ear 4 (four) times daily.  Dispense: 10 mL; Refill: 0  2. Hyperglycemia I have had a lengthy discussion and provided education about insulin resistance and the intake of too much sugar/refined carbohydrates.  I have advised the patient to work at a goal of eliminating sugary drinks, candy, desserts, sweets, refined sugars, processed foods, and white carbohydrates.  The patient expresses understanding.  - Glucose (CBG)  3. Language barrier [employer] interpreters used and additional time performing visit was required.   Patient have been counseled extensively about nutrition and exercise  Return for keep 07/21/2018 appt.  The patient was given clear instructions to go to ER or return to medical center if symptoms don't improve, worsen or new problems develop. The patient verbalized understanding. The patient was told to call to get lab results if they haven't heard anything in the next  week.     Georgian CoAngela Dola Lunsford, PA-C [facility] [place], [place] [phone]   06/10/2018, 3:25 PM

## 2018-07-21 ENCOUNTER — Encounter: Payer: Self-pay | Admitting: Nurse Practitioner

## 2018-08-11 ENCOUNTER — Encounter: Payer: Self-pay | Admitting: Nurse Practitioner

## 2018-08-19 MED FILL — LARIN FE 1.5-30 TABLET: 1.5-30 | 28 days supply | Qty: 28 | Fill #3

## 2018-10-28 NOTE — L&D Delivery Note (Signed)
OB/GYN Faculty Practice Delivery Note  Kimberly Reid is a 27 y.o. G2P1001 s/p NSVD at [redacted]w[redacted]d. She was admitted for spontaneous labor and SROM.   ROM: 16h 52m with clear fluid GBS Status:  Negative (07/30 0000) Maximum Maternal Temperature: 99.1 F    Labor Progress: . Patient arrived at 1.5 cm and progressed to 7 cm at which point pitocin augmentation was begun and she progressed to fully  Delivery Date/Time: 06/18/2019 at 0738 Delivery: Called to room and patient was complete and pushing. Head delivered with maternal effort. No nuchal cord present. Shoulder and body delivered in usual fashion. Infant with spontaneous cry, placed on mother's abdomen, dried and stimulated. Cord clamped x 2 after 1-minute delay, and cut by father. Cord blood drawn. Placenta delivered spontaneously with gentle cord traction. Fundus firm with massage and Pitocin. Labia, perineum, vagina, and cervix inspected with no lacerations.   Placenta: 3v cord, intact Complications: none Lacerations: none EBL: 300 cc Analgesia: epidural   Postpartum Planning [ ]  message to sent to schedule follow-up  [ ]  vaccines UTD  Infant: Apgars 9, [place], MD/[facility] OB/GYN Fellow, Faculty Practice

## 2018-11-03 ENCOUNTER — Encounter (HOSPITAL_COMMUNITY): Payer: Self-pay | Admitting: *Deleted

## 2018-11-03 ENCOUNTER — Inpatient Hospital Stay (HOSPITAL_COMMUNITY)
Admission: AD | Admit: 2018-11-03 | Discharge: 2018-11-03 | Disposition: A | Discharge status: Home / Self Care | Payer: Self-pay | Attending: Family Medicine | Admitting: Family Medicine

## 2018-11-03 ENCOUNTER — Inpatient Hospital Stay (HOSPITAL_COMMUNITY): Payer: Self-pay

## 2018-11-03 ENCOUNTER — Other Ambulatory Visit: Payer: Self-pay

## 2018-11-03 DIAGNOSIS — Z3A01 Less than 8 weeks gestation of pregnancy: Secondary | ICD-10-CM | POA: Insufficient documentation

## 2018-11-03 DIAGNOSIS — H60331 Swimmer's ear, right ear: Secondary | ICD-10-CM

## 2018-11-03 DIAGNOSIS — R1032 Left lower quadrant pain: Secondary | ICD-10-CM | POA: Insufficient documentation

## 2018-11-03 DIAGNOSIS — O208 Other hemorrhage in early pregnancy: Secondary | ICD-10-CM | POA: Insufficient documentation

## 2018-11-03 DIAGNOSIS — R109 Unspecified abdominal pain: Secondary | ICD-10-CM

## 2018-11-03 DIAGNOSIS — O26891 Other specified pregnancy related conditions, first trimester: Secondary | ICD-10-CM

## 2018-11-03 LAB — URINALYSIS, ROUTINE W REFLEX MICROSCOPIC
Bilirubin Urine: NEGATIVE
GLUCOSE, UA: NEGATIVE mg/dL
Hgb urine dipstick: NEGATIVE
KETONES UR: NEGATIVE mg/dL
LEUKOCYTES UA: NEGATIVE
NITRITE: NEGATIVE
PH: 5 (ref 5.0–8.0)
Protein, ur: NEGATIVE mg/dL
SPECIFIC GRAVITY, URINE: 1.021 (ref 1.005–1.030)

## 2018-11-03 LAB — CBC
HCT: 40.5 % (ref 36.0–46.0)
Hemoglobin: 13.7 g/dL (ref 12.0–15.0)
MCH: 29.5 pg (ref 26.0–34.0)
MCHC: 33.8 g/dL (ref 30.0–36.0)
MCV: 87.1 fL (ref 80.0–100.0)
Platelets: 223 10*3/uL (ref 150–400)
RBC: 4.65 MIL/uL (ref 3.87–5.11)
RDW: 12.9 % (ref 11.5–15.5)
WBC: 7.6 10*3/uL (ref 4.0–10.5)
nRBC: 0 % (ref 0.0–0.2)

## 2018-11-03 LAB — HCG, QUANTITATIVE, PREGNANCY: hCG, Beta Chain, Quant, S: 71811 m[IU]/mL — ABNORMAL HIGH (ref <5)

## 2018-11-03 LAB — POCT PREGNANCY, URINE: Preg Test, Ur: POSITIVE — AB

## 2018-11-03 MED ORDER — NEOMYCIN-POLYMYXIN-HC 3.5-10000-1 OT SOLN: 3.0000 [drp] | Freq: Four times a day (QID) | OTIC | 0 refills | Status: DC

## 2018-11-03 NOTE — MAU Note (Signed)
Pt presents to MAU with complaints of pain in the lower left abdomen. Had back pain last night. + HPT on December the 9th. Denies any VB

## 2018-11-03 NOTE — MAU Provider Note (Signed)
History     CSN: [id number]  Arrival date and time: 11/03/18 1543   First Provider Initiated Contact with Patient 11/03/18 1728      Chief Complaint  Patient presents with  . Abdominal Pain   Kimberly Reid is a 28 yo G2P1001 female who is currently in her first trimester of pregnancy at 7 weeks 4 days presenting with a chief complaint of abdominal pain. Patient states that this pain began last Thursday and described it as a diffuse "burning" similar to that of reflux. This pain resolved until Sunday when she began experiencing diffuse low back pain and suprapubic pain. Patient describes this pain as "constant" and "sharp." She states that her back pain has since resolved but the suprapubic pain has migrated to the LLQ yesterday and worsened in severity rating it 8/10 last night. She continues to experience this pain today but rates it a 3-4/10 currently. Patient claims that nothing makes the pain better or worse and she has not tried anything to relieve it. Patient denies vaginal bleeding and discharge, dysuria, urinary frequency and urgency, fever, diarrhea, constipation, and vomiting. She does admit to some nausea but claims that this is relatively normal and related to her pregnancy. Patient notes that she has been experiencing some numbness in her arms extending from her fingers to her shoulders that is worse in the right arm and only occurs when she wakes up in the middle of the night and resolves by the morning. She denies edema and decreased ROM in her extremities when this occurs.    OB History    Gravida  2   Para  1   Term  1   Preterm  0   AB  0   Living  1     SAB  0   TAB  0   Ectopic  0   Multiple  0   Live Births  1           Past Medical History:  Diagnosis Date  . 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 Gestational diabetes   . Heart murmur   . Polycystic ovary syndrome 03/24/2013    Past Surgical History:  Procedure  Laterality Date  . CARDIAC SURGERY      Family History  Problem Relation Age of Onset  . Diabetes Mother     Social History   Tobacco Use  . Smoking status: Never Smoker  . Smokeless tobacco: Never Used  Substance Use Topics  . Alcohol use: Yes  . Drug use: No    Allergies: No Known Allergies  Medications Prior to Admission  Medication Sig Dispense Refill Last Dose  . Prenatal Vit-Fe Fumarate-FA (MULTIVITAMIN-PRENATAL) 27-0.8 MG TABS tablet Take 1 tablet by mouth daily at 12 noon.   11/03/2018 at Unknown time  . neomycin-polymyxin-hydrocortisone (CORTISPORIN) OTIC solution Place 3 drops into the right ear 4 (four) times daily. 10 mL 0   . norethindrone-ethinyl estradiol-iron (MICROGESTIN FE 1.5/30) 1.5-30 MG-MCG tablet Take 1 tablet by mouth daily. 1 Package 11 Taking    Review of Systems  Constitutional: Negative for appetite change and fever.  Gastrointestinal: Positive for abdominal pain (left suprapubic) and nausea. Negative for constipation, diarrhea and vomiting.  Genitourinary: Negative for difficulty urinating, dysuria, flank pain, frequency, hematuria, urgency, vaginal bleeding and vaginal discharge.  Neurological: Positive for numbness (bilateral upper extremities, worse in the left). Negative for facial asymmetry, speech difficulty and weakness.   Physical Exam  Blood pressure (!) 123/54, pulse (!) 55, temperature 98.1 F (36.7 C), resp. rate 16, height 5\' 3"  (1.6 m), weight 84.4 kg, last menstrual period 09/11/2018, SpO2 100 %, currently breastfeeding.  Physical Exam  Constitutional: She appears well-developed and well-nourished. No distress.  GI: Soft. Normal appearance. She exhibits no distension. There is abdominal tenderness in the left lower quadrant. There is no rigidity and no guarding.  Genitourinary:    Vagina normal.  Cervix exhibits discharge (cloudy white appearance without blood). Right adnexum displays no tenderness. Left adnexum displays  tenderness.    MAU Course  Procedures  MDM 28 yo G2P1001 female who is currently in her first trimester of pregnancy at 7 weeks 4 days presenting with a chief complaint of LLQ abdominal pain without vaginal bleeding. UA negative for UTI. Ultrasound showed IUP measuring 8 weeks with small subchorionic hemorrhage as likely cause of abdominal pain.    Results for orders placed or performed during the hospital encounter of 11/03/18 (from the past 24 hour(s))  Urinalysis, Routine w reflex microscopic     Status: None   Collection Time: 11/03/18  4:40 PM  Result Value Ref Range   Color, Urine YELLOW YELLOW   APPearance CLEAR CLEAR   Specific Gravity, Urine 1.021 1.005 - 1.030   pH 5.0 5.0 - 8.0   Glucose, UA NEGATIVE NEGATIVE mg/dL   Hgb urine dipstick NEGATIVE NEGATIVE   Bilirubin Urine NEGATIVE NEGATIVE   Ketones, ur NEGATIVE NEGATIVE mg/dL   Protein, ur NEGATIVE NEGATIVE mg/dL   Nitrite NEGATIVE NEGATIVE   Leukocytes, UA NEGATIVE NEGATIVE  Pregnancy, urine POC     Status: Abnormal   Collection Time: 11/03/18  4:51 PM  Result Value Ref Range   Preg Test, Ur POSITIVE (A) NEGATIVE  hCG, quantitative, pregnancy     Status: Abnormal   Collection Time: 11/03/18  5:44 PM  Result Value Ref Range   hCG, Beta Chain, Quant, S 71,811 (H) <5 mIU/mL  CBC     Status: None   Collection Time: 11/03/18  5:44 PM  Result Value Ref Range   WBC 7.6 4.0 - 10.5 K/uL   RBC 4.65 3.87 - 5.11 MIL/uL   Hemoglobin 13.7 12.0 - 15.0 g/dL   HCT 16.1 09.6 - 04.5 %   MCV 87.1 80.0 - 100.0 fL   MCH 29.5 26.0 - 34.0 pg   MCHC 33.8 30.0 - 36.0 g/dL   RDW 40.9 81.1 - 91.4 %   Platelets 223 150 - 400 K/uL   nRBC 0.0 0.0 - 0.2 %    US Ob Comp Less 14 Wks  Result Date: 11/03/2018 CLINICAL DATA:  Left lower quadrant pain for 1 day. Gestational age by LMP of 7 weeks 4 days. EXAM: OBSTETRIC <14 WK [place] AND TRANSVAGINAL OB US TECHNIQUE: Both transabdominal and transvaginal ultrasound examinations were performed for  complete evaluation of the gestation as well as the maternal uterus, adnexal regions, and pelvic cul-de-sac. Transvaginal technique was performed to assess early pregnancy. COMPARISON:  None. FINDINGS: Intrauterine gestational sac: Single Yolk sac:  Visualized. Embryo:  Visualized. Cardiac Activity: Visualized. Heart Rate: 169 bpm CRL:  16 mm   8 w   0 d                  [place] EDC: 06/15/2019 Subchorionic hemorrhage:  Probable small subchorionic hemorrhage. Maternal uterus/adnexae: Normal appearance of both ovaries. No mass or abnormal free fluid identified. IMPRESSION: Single living IUP measuring 8 weeks 0 days, with [place] [facility]  of 06/15/2019. Probable small subchorionic hemorrhage noted. Electronically Signed   By: Myles Rosenthal M.D.   On: 11/03/2018 18:51   US Ob Transvaginal  Result Date: 11/03/2018 CLINICAL DATA:  Left lower quadrant pain for 1 day. Gestational age by LMP of 7 weeks 4 days. EXAM: OBSTETRIC <14 WK [place] AND TRANSVAGINAL OB US TECHNIQUE: Both transabdominal and transvaginal ultrasound examinations were performed for complete evaluation of the gestation as well as the maternal uterus, adnexal regions, and pelvic cul-de-sac. Transvaginal technique was performed to assess early pregnancy. COMPARISON:  None. FINDINGS: Intrauterine gestational sac: Single Yolk sac:  Visualized. Embryo:  Visualized. Cardiac Activity: Visualized. Heart Rate: 169 bpm CRL:  16 mm   8 w   0 d                  [place] EDC: 06/15/2019 Subchorionic hemorrhage:  Probable small subchorionic hemorrhage. Maternal uterus/adnexae: Normal appearance of both ovaries. No mass or abnormal free fluid identified. IMPRESSION: Single living IUP measuring 8 weeks 0 days, with [place] EDC of 06/15/2019. Probable small subchorionic hemorrhage noted. Electronically Signed   By: Myles Rosenthal M.D.   On: 11/03/2018 18:51     Assessment and Plan  Kimberly Reid is a 28 yo G2P1001 female who is currently in her first trimester of pregnancy at 7 weeks 4 days  presenting with a chief complaint of LLQ abdominal pain without vaginal bleeding. Ultrasound confirmed IUP measuring 8 weeks and showed a small subchorionic hemorrhage as the likely cause of patient's abdominal pain. These results were discussed with patient and patient was instructed to follow up with outpatient OBGYN  Kimberly Reid 11/03/2018, 5:56 PM

## 2018-11-03 NOTE — Discharge Instructions (Signed)

## 2018-11-04 LAB — HIV ANTIBODY (ROUTINE TESTING W REFLEX): HIV Screen 4th Generation wRfx: NONREACTIVE

## 2018-11-11 ENCOUNTER — Encounter: Payer: Self-pay | Admitting: Family Medicine

## 2018-11-12 ENCOUNTER — Other Ambulatory Visit (INDEPENDENT_AMBULATORY_CARE_PROVIDER_SITE_OTHER): Payer: Self-pay

## 2018-11-12 DIAGNOSIS — Z3490 Encounter for supervision of normal pregnancy, unspecified, unspecified trimester: Secondary | ICD-10-CM

## 2018-11-12 LAB — POCT URINALYSIS DIP (MANUAL ENTRY)
Bilirubin, UA: NEGATIVE
Blood, UA: NEGATIVE
GLUCOSE UA: NEGATIVE mg/dL
Leukocytes, UA: NEGATIVE
Nitrite, UA: NEGATIVE
Protein Ur, POC: NEGATIVE mg/dL
SPEC GRAV UA: 1.015 (ref 1.010–1.025)
Urobilinogen, UA: 1 E.U./dL
pH, UA: 7.5 (ref 5.0–8.0)

## 2018-11-14 LAB — CULTURE, OB URINE

## 2018-11-14 LAB — URINE CULTURE, OB REFLEX: Organism ID, Bacteria: NO GROWTH

## 2018-11-17 LAB — HGB FRAC. W/SOLUBILITY
HGB S: 0 %
Hgb A2 Quant: 2.1 % (ref 1.8–3.2)
Hgb A: 97.9 % (ref 96.4–98.8)
Hgb C: 0 %
Hgb F Quant: 0 % (ref 0.0–2.0)
Hgb Solubility: NEGATIVE
Hgb Variant: 0 %

## 2018-11-17 LAB — OBSTETRIC PANEL, INCLUDING HIV
Antibody Screen: NEGATIVE
Basophils Absolute: 0 10*3/uL (ref 0.0–0.2)
Basos: 1 %
EOS (ABSOLUTE): 0.1 10*3/uL (ref 0.0–0.4)
Eos: 1 %
HEMOGLOBIN: 13.4 g/dL (ref 11.1–15.9)
HIV Screen 4th Generation wRfx: NONREACTIVE
Hematocrit: 39.7 % (ref 34.0–46.6)
Hepatitis B Surface Ag: NEGATIVE
IMMATURE GRANS (ABS): 0 10*3/uL (ref 0.0–0.1)
Immature Granulocytes: 0 %
LYMPHS: 34 %
Lymphocytes Absolute: 2.2 10*3/uL (ref 0.7–3.1)
MCH: 28.9 pg (ref 26.6–33.0)
MCHC: 33.8 g/dL (ref 31.5–35.7)
MCV: 86 fL (ref 79–97)
Monocytes Absolute: 0.6 10*3/uL (ref 0.1–0.9)
Monocytes: 8 %
Neutrophils Absolute: 3.6 10*3/uL (ref 1.4–7.0)
Neutrophils: 56 %
Platelets: 237 10*3/uL (ref 150–450)
RBC: 4.64 x10E6/uL (ref 3.77–5.28)
RDW: 12.8 % (ref 11.7–15.4)
RPR Ser Ql: NONREACTIVE
Rh Factor: POSITIVE
Rubella Antibodies, IGG: 5.48 index (ref >0.99)
WBC: 6.5 10*3/uL (ref 3.4–10.8)

## 2018-11-19 ENCOUNTER — Other Ambulatory Visit (HOSPITAL_COMMUNITY): Admission: RE | Admit: 2018-11-19 | Payer: Self-pay | Source: Ambulatory Visit | Admitting: Family Medicine

## 2018-11-19 ENCOUNTER — Other Ambulatory Visit: Payer: Self-pay

## 2018-11-19 ENCOUNTER — Ambulatory Visit (INDEPENDENT_AMBULATORY_CARE_PROVIDER_SITE_OTHER): Payer: Self-pay | Admitting: Family Medicine

## 2018-11-19 DIAGNOSIS — Z3481 Encounter for supervision of other normal pregnancy, first trimester: Secondary | ICD-10-CM

## 2018-11-19 DIAGNOSIS — O0990 Supervision of high risk pregnancy, unspecified, unspecified trimester: Secondary | ICD-10-CM | POA: Insufficient documentation

## 2018-11-19 LAB — POCT 1 HR PRENATAL GLUCOSE: Glucose 1 Hr Prenatal, POC: 89 mg/dL

## 2018-11-19 NOTE — Progress Notes (Signed)
Kimberly Reid is a 28 y.o. yo G2P1001 at [redacted]w[redacted]d who presents for her initial prenatal visit.last child born in 2017.  Pregnancy  is notplanned but was considering pregnancy.  She reports fatigue, frequent urination, nausea and feeling of cold.  the same thing happened with her son.  her fingsre become cyanotic she states. . She  isTaking PNV. She uses the [employer] brand.   See flow sheet for details.  Other Concerns: She had heart surgery as a baby.  Treated until she was [age].   Gestational diabetes with the last pregnancy.   Not taking any medications currently and does not have any significant recent medical history   No allergies.   Lives with husband and son.    Date of conception 09/08/2018 according to dating ultrasound.    Does not drink, Smoke  Or do drugs.    She is happy about her pregnancy.    PMH, POBH, FH, meds, allergies and Social Hx reviewed.  BP 108/62   Pulse 60   Temp 98.7 F (37.1 C)   Wt 189 lb 12.8 oz (86.1 kg)   LMP 09/11/2018   BMI 33.62 kg/m   Prenatal exam:Gen: Well nourished, well developed.  No distress.  Vitals noted. HEENT: Normocephalic, atraumatic.  Neck supple without cervical lymphadenopathy, thyromegaly or thyroid nodules.  fair dentition. CV: RRR. Systolic murmur heard.   Lungs: CTA B.  Normal respiratory effort without wheezes or rales. Abd: soft, NTND. +BS.  Uterus not appreciated above pelvis. GU: Normal external female genitalia without lesions.  Nl vaginal, well rugated without lesions. No vaginal discharge.  Bimanual exam: No adnexal mass or TTP. No CMT.   Ext: No clubbing, cyanosis or edema. Psych: Normal grooming and dress.  Not depressed or anxious appearing.  Normal thought content and process without flight of ideas or looseness of associations   Assessment/plan: 1) Pregnancy [redacted]w[redacted]d doing well.  Current pregnancy issues include history of gestational diabetes and history of congenital heart disorder. 1 hour glucola was  given and referral to cardiology was given for murmur. TSH ordered bc patient complaining of feeling cold, fatigue, and cyanosis of fingers.  Dating is not reliable Prenatal labs reviewed, nothing abnormal.  . Bleeding and pain precautions reviewed. Importance of prenatal vitamins reviewed.  Genetic screening offered.  Early glucola is indicated.    Follow up 4 weeks.

## 2018-11-19 NOTE — Patient Instructions (Addendum)
Congratulations!  You are currently 9 weeks and 6 days pregnant.    I will call you with the results of your lab tests.    I have sent out a referral to cardiology because of your history of heart problems as a child and because I heard a murmur when listening to your heart.  Someone will call you to set up an appointment.    You can come back in 4 weeks for your next appointment.    Have a great day,   Frederic Jericho, MD

## 2018-11-20 LAB — CERVICOVAGINAL ANCILLARY ONLY
Chlamydia: NEGATIVE
Neisseria Gonorrhea: NEGATIVE

## 2018-11-20 LAB — TSH: TSH: 1.83 u[IU]/mL (ref 0.450–4.500)

## 2018-11-24 LAB — CYTOLOGY - PAP: Diagnosis: NEGATIVE

## 2018-12-17 ENCOUNTER — Ambulatory Visit: Payer: Self-pay | Admitting: Family Medicine

## 2018-12-21 ENCOUNTER — Ambulatory Visit: Payer: Self-pay | Admitting: Nurse Practitioner

## 2018-12-23 ENCOUNTER — Ambulatory Visit (INDEPENDENT_AMBULATORY_CARE_PROVIDER_SITE_OTHER): Payer: Self-pay | Admitting: Family Medicine

## 2018-12-23 ENCOUNTER — Other Ambulatory Visit: Payer: Self-pay

## 2018-12-23 VITALS — BP 98/61 | HR 55 | Temp 98.1°F | Wt 188.0 lb

## 2018-12-23 DIAGNOSIS — Z3482 Encounter for supervision of other normal pregnancy, second trimester: Secondary | ICD-10-CM

## 2018-12-23 DIAGNOSIS — Z3A14 14 weeks gestation of pregnancy: Secondary | ICD-10-CM

## 2018-12-23 NOTE — Patient Instructions (Addendum)
We are scheduling you a time for your ultrasound.  The quad screen is a blood test we will do today and discuss the results with you on your next visit.  Your anatomy ultrasound will also likely be done by your next visit as well.    Your pregnancy is going well so far.  Please follow up in 4 weeks for your next prental visit.    Have a great day,   Kimberly Reid

## 2018-12-23 NOTE — Progress Notes (Signed)
Kimberly Reid is a 28 y.o. G 2 P 1001 at [redacted]w[redacted]d for routine follow up.  She reports Mild left-sided abdominal pain See flow sheet for details.  Patient states she is having some mild left-sided abdominal pain that is worse at night.  She occasionally takes Advil when the pain is bad enough.  She states it felt like a "ball "in her belly the other day. She changed the prenatal vitamins to Gummies that she takes twice a day. She states nobody called her about her echocardiogram that was referred during last visit. Patient states she sometimes feels her baby move a little bit.  She has no vaginal bleeding, no loss of fluid, no contractions.  BP 98/61   Pulse (!) 55   Temp 98.1 F (36.7 C)   Wt 188 lb (85.3 kg)   LMP 09/11/2018   BMI 33.30 kg/m  GEN: Alert and oriented.  No acute distress. CV: Normal rate, regular rhythm.  Systolic murmur appreciated.  Similar to last exam. PULM: Lungs clear to auscultation bilaterally.  No wheezes or crackles. GI: Mild tenderness to palpation in the lower left quadrant.  Normal bowel sounds.  Fetal heart tones appreciated.  125 Bpm SKIN: No rashes. PSYCH: Pleasant affect.  Patient smiling.  A/P: Pregnancy at [redacted]w[redacted]d.  Doing well.  Patient has a uneventful pregnancy. Pregnancy issues include murmur that has been present since childhood; round ligament pain on the left side.  It appears patient has a cardiology appointment with Dr. Jens Som on 2/28.  We will follow-up with results during next visit. Anatomy ultrasound ordered to be scheduled on her next visit. Pt  is interested in genetic screening.  Quad screen labs were drawn today Bleeding and pain precautions reviewed. Follow up 4 weeks.

## 2018-12-25 ENCOUNTER — Ambulatory Visit (INDEPENDENT_AMBULATORY_CARE_PROVIDER_SITE_OTHER): Payer: Self-pay | Admitting: Cardiology

## 2018-12-25 ENCOUNTER — Encounter: Payer: Self-pay | Admitting: Cardiology

## 2018-12-25 VITALS — BP 108/64 | HR 61 | Ht 62.0 in | Wt 191.0 lb

## 2018-12-25 DIAGNOSIS — Z8774 Personal history of (corrected) congenital malformations of heart and circulatory system: Secondary | ICD-10-CM

## 2018-12-25 DIAGNOSIS — R011 Cardiac murmur, unspecified: Secondary | ICD-10-CM

## 2018-12-25 NOTE — Patient Instructions (Signed)
Medication Instructions:  NO CHANGE If you need a refill on your cardiac medications before your next appointment, please call your pharmacy.   Lab work: If you have labs (blood work) drawn today and your tests are completely normal, you will receive your results only by: Marland Kitchen MyChart Message (if you have MyChart) OR . A paper copy in the mail If you have any lab test that is abnormal or we need to change your treatment, we will call you to review the results.  Testing/Procedures: Your physician has requested that you have an echocardiogram. Echocardiography is a painless test that uses sound waves to create images of your heart. It provides your doctor with information about the size and shape of your heart and how well your heart's chambers and valves are working. This procedure takes approximately one hour. There are no restrictions for this procedure.  [address]  Follow-Up: At [facility], you and your health needs are our priority.  As part of our continuing mission to provide you with exceptional heart care, we have created designated Provider Care Teams.  These Care Teams include your primary Cardiologist (physician) and Advanced Practice Providers (APPs -  Physician Assistants and Nurse Practitioners) who all work together to provide you with the care you need, when you need it. Your physician recommends that you schedule a follow-up appointment in: 4 MONTHS WITH DR Jens Som

## 2018-12-25 NOTE — Progress Notes (Signed)
Referring-Marshall Chambliss MD Reason for referral-murmur; history of repair of coarctation of the aorta  HPI: 28 year old female for evaluation of murmur and history of repair of coarctation of the aorta at request of Pearlean Brownie, MD. Patient underwent repair of coarctation at approximately 42 months of age in [place].  She denies dyspnea on exertion, orthopnea, PND, pedal edema, exertional chest pain or syncope.  Occasional palpitations at night.  Cardiology now asked to evaluate.  Current Outpatient Medications  Medication Sig Dispense Refill  . Prenatal Vit-Fe Fumarate-FA (MULTIVITAMIN-PRENATAL) 27-0.8 MG TABS tablet Take 1 tablet by mouth daily at 12 noon.     No current facility-administered medications for this visit.     No Known Allergies   Past Medical History:  Diagnosis Date  . 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 Gestational diabetes   . Heart murmur   . Polycystic ovary syndrome 03/24/2013    Past Surgical History:  Procedure Laterality Date  . CARDIAC SURGERY    . COARCTATION OF AORTA REPAIR      Social History   Socioeconomic History  . Marital status: Married    Spouse name: Not on file  . Number of children: 1  . Years of education: Not on file  . Highest education level: Not on file  Occupational History  . Not on file  Social Needs  . Financial resource strain: Not on file  . Food insecurity:    Worry: Not on file    Inability: Not on file  . Transportation needs:    Medical: Not on file    Non-medical: Not on file  Tobacco Use  . Smoking status: Never Smoker  . Smokeless tobacco: Never Used  Substance and Sexual Activity  . Alcohol use: Not Currently    Frequency: Never  . Drug use: No  . Sexual activity: Yes    Birth control/protection: None  Lifestyle  . Physical activity:    Days per week: Not on file    Minutes per session: Not on file  . Stress: Not on file  Relationships  . Social connections:   Talks on phone: Not on file    Gets together: Not on file    Attends religious service: Not on file    Active member of club or organization: Not on file    Attends meetings of clubs or organizations: Not on file    Relationship status: Not on file  . Intimate partner violence:    Fear of current or ex partner: Not on file    Emotionally abused: Not on file    Physically abused: Not on file    Forced sexual activity: Not on file  Other Topics Concern  . Not on file  Social History Narrative  . Not on file    Family History  Problem Relation Age of Onset  . Diabetes Mother   . Congenital Murmur Sister     ROS: no fevers or chills, productive cough, hemoptysis, dysphasia, odynophagia, melena, hematochezia, dysuria, hematuria, rash, seizure activity, orthopnea, PND, pedal edema, claudication. Remaining systems are negative.  Physical Exam:   Blood pressure 108/64, pulse 61, height 5\' 2"  (1.575 m), weight 191 lb (86.6 kg), last menstrual period 09/11/2018, currently breastfeeding.  General:  Well developed/well nourished in NAD Skin warm/dry Patient not depressed No peripheral clubbing Back-normal HEENT-normal/normal eyelids Neck supple/normal carotid upstroke bilaterally; no bruits; no JVD; no thyromegaly chest - CTA/ normal expansion CV - RRR/normal S1  and S2; no rubs or gallops;  PMI nondisplaced; 2/6 to 3/6 systolic murmur left sternal border radiating to carotids. Abdomen -NT/ND, no HSM, + bowel sounds, no bruit 2+ femoral pulses, no bruits, 13-week intrauterine pregnancy Ext-no edema, chords, 2+ DP Neuro-grossly nonfocal  ECG -sinus rhythm at a rate of 61, RV conduction delay, left ventricular hypertrophy.  Personally reviewed  A/P  1 history of coarctation repair-no symptoms.  We will arrange an echocardiogram to assess LV function, aortic valve and ascending aortic gradient.  If no significant gradient she should not have significant issues with delivery.  Note she  delivered her first child 2 years ago vaginally with no issues.  2 murmur-plan echocardiogram to further assess.  3 13-week intrauterine pregnancy-Per OB.  Olga Millers, MD

## 2018-12-29 ENCOUNTER — Other Ambulatory Visit: Payer: Self-pay | Admitting: Family Medicine

## 2018-12-29 DIAGNOSIS — Z3481 Encounter for supervision of other normal pregnancy, first trimester: Secondary | ICD-10-CM

## 2018-12-29 NOTE — Progress Notes (Signed)
Called pt to let her know that we will need to redraw the quad screen test next week due to being too early last time, and that we are putting in a referral to have her transferred to high risk OB due to her preexisting cardiac issues. She should keep her next appointment with [place] unless she hears otherwise from [place].   Used [employer], who called and stated it rang once and he could not leave a message.

## 2018-12-30 ENCOUNTER — Telehealth: Payer: Self-pay | Admitting: *Deleted

## 2018-12-30 ENCOUNTER — Ambulatory Visit (HOSPITAL_COMMUNITY): Payer: Self-pay | Attending: Cardiology

## 2018-12-30 DIAGNOSIS — Z8774 Personal history of (corrected) congenital malformations of heart and circulatory system: Secondary | ICD-10-CM | POA: Insufficient documentation

## 2018-12-30 NOTE — Telephone Encounter (Signed)
-----   Message from Sandre Kitty, MD sent at 12/29/2018  5:47 PM EST ----- Regarding: patient call Tried to call this patient regarding referral to OB and to repeat the quad screen.  No answer.  Can someone try calling her again to let her know this.  See my orders only encounter for more details.    Thanks,   Jesusita Oka

## 2018-12-30 NOTE — Telephone Encounter (Signed)
Tried to contact pt using [employer] [phone], she stated that the phone said that VM has not been set up. Please give her the message and schedule her a lab visit if she calls back.Lamonte Sakai, Camren Henthorn D, [place]

## 2018-12-31 NOTE — Telephone Encounter (Signed)
Contacted pt using [employer] [phone], to inform pt that the doctor would like to do a repeat quad screen.  This appointment is scheduled for 01/05/2019 @ 11:30am. Kimberly Reid, Maddyx Wieck D, CMA

## 2019-01-04 ENCOUNTER — Telehealth: Payer: Self-pay | Admitting: *Deleted

## 2019-01-04 DIAGNOSIS — I712 Thoracic aortic aneurysm, without rupture, unspecified: Secondary | ICD-10-CM

## 2019-01-04 NOTE — Telephone Encounter (Signed)
Spoke with pt with the assistance of interpretor [phone]. Order placed for MRA and [facility] to call to schedule.

## 2019-01-04 NOTE — Telephone Encounter (Signed)
-----   Message from Lewayne Bunting, MD sent at 12/31/2018  7:50 AM EST ----- Schedule MRA of thoracic aorta without Gadolinium to R/O thoracic aortic aneurysm (no gad or CTA as pt is [redacted] weeks pregnant). Study is purely to R/O large thoracic aortic aneurysm Olga Millers

## 2019-01-05 ENCOUNTER — Ambulatory Visit: Payer: Self-pay | Admitting: Family Medicine

## 2019-01-18 ENCOUNTER — Ambulatory Visit (INDEPENDENT_AMBULATORY_CARE_PROVIDER_SITE_OTHER): Payer: Self-pay | Admitting: Family Medicine

## 2019-01-18 ENCOUNTER — Other Ambulatory Visit: Payer: Self-pay

## 2019-01-18 ENCOUNTER — Encounter: Payer: Self-pay | Admitting: Family Medicine

## 2019-01-18 VITALS — BP 120/58 | HR 52 | Temp 97.9°F | Wt 193.8 lb

## 2019-01-18 DIAGNOSIS — Z8774 Personal history of (corrected) congenital malformations of heart and circulatory system: Secondary | ICD-10-CM

## 2019-01-18 DIAGNOSIS — O0992 Supervision of high risk pregnancy, unspecified, second trimester: Secondary | ICD-10-CM

## 2019-01-18 DIAGNOSIS — O0990 Supervision of high risk pregnancy, unspecified, unspecified trimester: Secondary | ICD-10-CM

## 2019-01-18 DIAGNOSIS — Z8632 Personal history of gestational diabetes: Secondary | ICD-10-CM

## 2019-01-18 DIAGNOSIS — Z3A18 18 weeks gestation of pregnancy: Secondary | ICD-10-CM

## 2019-01-18 DIAGNOSIS — Z23 Encounter for immunization: Secondary | ICD-10-CM

## 2019-01-18 NOTE — Progress Notes (Signed)
Subjective:  Kimberly Reid is a G2P1001 [redacted]w[redacted]d being seen today for her first obstetrical visit.  Her obstetrical history is significant for gestational diabetes with prior pregnancy, history of coarctation of aorta repair.. Patient does intend to breast feed. Pregnancy history fully reviewed.  Patient reports no complaints.  BP (!) 120/58   Pulse (!) 52   Temp 97.9 F (36.6 C)   Wt 193 lb 12.8 oz (87.9 kg)   LMP 09/11/2018   BMI 35.45 kg/m   HISTORY: OB History  Gravida Para Term Preterm AB Living  2 1 1  0 0 1  SAB TAB Ectopic Multiple Live Births  0 0 0 0 1    # Outcome Date GA Lbr Len/2nd Weight Sex Delivery Anes PTL Lv  2 Current           1 Term 07/16/16 [redacted]w[redacted]d / 00:54 7 lb 1 oz (3.204 kg) M Vag-Spont EPI  LIV    Past Medical History:  Diagnosis Date  . 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 Gestational diabetes   . Heart murmur   . Polycystic ovary syndrome 03/24/2013    Past Surgical History:  Procedure Laterality Date  . CARDIAC SURGERY    . COARCTATION OF AORTA REPAIR      Family History  Problem Relation Age of Onset  . Diabetes Mother   . Congenital Murmur Sister      Exam  BP (!) 120/58   Pulse (!) 52   Temp 97.9 F (36.6 C)   Wt 193 lb 12.8 oz (87.9 kg)   LMP 09/11/2018   BMI 35.45 kg/m   CONSTITUTIONAL: Well-developed, well-nourished female in no acute distress.  HENT:  Normocephalic, atraumatic, External right and left ear normal. Oropharynx is clear and moist EYES: Conjunctivae and EOM are normal. Pupils are equal, round, and reactive to light. No scleral icterus.  NECK: Normal range of motion, supple, no masses.  Normal thyroid.  CARDIOVASCULAR: Normal heart rate noted, regular rhythm RESPIRATORY: Clear to auscultation bilaterally. Effort and breath sounds normal, no problems with respiration noted. BREASTS: Symmetric in size. No masses, skin changes, nipple drainage, or lymphadenopathy. ABDOMEN: Soft, normal bowel  sounds, no distention noted.  No tenderness, rebound or guarding.  PELVIC: Normal appearing external genitalia; normal appearing vaginal mucosa and cervix. No abnormal discharge noted. Normal uterine size, no other palpable masses, no uterine or adnexal tenderness. MUSCULOSKELETAL: Normal range of motion. No tenderness.  No cyanosis, clubbing, or edema.  2+ distal pulses. SKIN: Skin is warm and dry. No rash noted. Not diaphoretic. No erythema. No pallor. NEUROLOGIC: Alert and oriented to person, place, and time. Normal reflexes, muscle tone coordination. No cranial nerve deficit noted. PSYCHIATRIC: Normal mood and affect. Normal behavior. Normal judgment and thought content.    Assessment:    Pregnancy: G2P1001 Patient Active Problem List   Diagnosis Date Noted  . Supervision of high risk pregnancy, antepartum 11/19/2018  . PCOS (polycystic ovarian syndrome) 05/26/2013  . 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 History of repair of coarctation of aorta 05/26/2013      Plan:   1. Supervision of high risk pregnancy, antepartum FHT and FH normal Anatomy scan already ordered. Will schedule - Flu Vaccine QUAD 36+ mos IM - US Fetal Echocardiography; Future  2. History of repair of coarctation of aorta - US Fetal Echocardiography; Future  3. History of gestational diabetes mellitus (GDM) - Hemoglobin A1c      Problem list reviewed and  updated. 75% of 30 min visit spent on counseling and coordination of care.     Levie Heritage 01/18/2019

## 2019-01-20 ENCOUNTER — Ambulatory Visit: Payer: Self-pay | Admitting: Nurse Practitioner

## 2019-01-21 ENCOUNTER — Encounter: Payer: Self-pay | Admitting: Family Medicine

## 2019-01-21 NOTE — Progress Notes (Signed)
error 

## 2019-01-22 ENCOUNTER — Encounter (HOSPITAL_COMMUNITY): Payer: Self-pay

## 2019-01-22 ENCOUNTER — Ambulatory Visit (HOSPITAL_COMMUNITY): Payer: Self-pay | Admitting: *Deleted

## 2019-01-22 ENCOUNTER — Ambulatory Visit (HOSPITAL_COMMUNITY)
Admission: RE | Admit: 2019-01-22 | Discharge: 2019-01-22 | Disposition: A | Discharge status: Home / Self Care | Payer: Self-pay | Source: Ambulatory Visit | Attending: Family Medicine | Admitting: Family Medicine

## 2019-01-22 ENCOUNTER — Other Ambulatory Visit (HOSPITAL_COMMUNITY): Payer: Self-pay | Admitting: *Deleted

## 2019-01-22 ENCOUNTER — Other Ambulatory Visit: Payer: Self-pay

## 2019-01-22 ENCOUNTER — Other Ambulatory Visit: Payer: Self-pay | Admitting: Family Medicine

## 2019-01-22 VITALS — BP 123/59 | HR 62 | Temp 98.4°F

## 2019-01-22 DIAGNOSIS — O09292 Supervision of pregnancy with other poor reproductive or obstetric history, second trimester: Secondary | ICD-10-CM

## 2019-01-22 DIAGNOSIS — Z3A14 14 weeks gestation of pregnancy: Secondary | ICD-10-CM | POA: Insufficient documentation

## 2019-01-22 DIAGNOSIS — O0990 Supervision of high risk pregnancy, unspecified, unspecified trimester: Secondary | ICD-10-CM

## 2019-01-22 DIAGNOSIS — O99212 Obesity complicating pregnancy, second trimester: Secondary | ICD-10-CM

## 2019-01-22 DIAGNOSIS — Z362 Encounter for other antenatal screening follow-up: Secondary | ICD-10-CM

## 2019-01-22 DIAGNOSIS — O99412 Diseases of the circulatory system complicating pregnancy, second trimester: Secondary | ICD-10-CM

## 2019-01-22 DIAGNOSIS — Z363 Encounter for antenatal screening for malformations: Secondary | ICD-10-CM

## 2019-01-22 DIAGNOSIS — Z3A19 19 weeks gestation of pregnancy: Secondary | ICD-10-CM

## 2019-01-26 LAB — INHERITEST(R) CF/SMA PANEL

## 2019-01-26 LAB — HEMOGLOBIN A1C
Est. average glucose Bld gHb Est-mCnc: 91 mg/dL
Hgb A1c MFr Bld: 4.8 % (ref 4.8–5.6)

## 2019-02-02 ENCOUNTER — Other Ambulatory Visit: Payer: Self-pay

## 2019-02-02 ENCOUNTER — Ambulatory Visit
Admission: RE | Admit: 2019-02-02 | Discharge: 2019-02-02 | Disposition: A | Discharge status: Home / Self Care | Payer: Self-pay | Source: Ambulatory Visit | Attending: Cardiology | Admitting: Cardiology

## 2019-02-02 ENCOUNTER — Encounter: Payer: Self-pay | Admitting: *Deleted

## 2019-02-02 DIAGNOSIS — I712 Thoracic aortic aneurysm, without rupture, unspecified: Secondary | ICD-10-CM

## 2019-02-11 ENCOUNTER — Other Ambulatory Visit: Payer: Self-pay

## 2019-02-11 ENCOUNTER — Ambulatory Visit (INDEPENDENT_AMBULATORY_CARE_PROVIDER_SITE_OTHER): Payer: Self-pay | Admitting: *Deleted

## 2019-02-11 VITALS — BP 109/66 | HR 65

## 2019-02-11 DIAGNOSIS — O0990 Supervision of high risk pregnancy, unspecified, unspecified trimester: Secondary | ICD-10-CM

## 2019-02-11 NOTE — Progress Notes (Signed)
Pt here to get BP cuff.  Pt does not have access to a smart phone and is unable to use the baby scripts app.  Advised pt to take her BP weekly and to write it down in a notebook so that she could give [place] those BPs when she is contacted for her appointments.  Pt advised to contact the office if her BP is >140/90.  Pt instructed on how to take her BP at home, including positioning, cuff use, and waiting after being active to take her BP.  Pt verbalized and demonstrated understanding of all teaching.

## 2019-02-12 NOTE — Progress Notes (Signed)
I have reviewed this chart and agree with the RN/CMA assessment and management.    Ellarie Picking C Kambri Dismore, MD, FACOG Attending Physician, [facility]  

## 2019-02-15 ENCOUNTER — Encounter: Payer: Self-pay | Admitting: Obstetrics and Gynecology

## 2019-02-15 ENCOUNTER — Ambulatory Visit (INDEPENDENT_AMBULATORY_CARE_PROVIDER_SITE_OTHER): Payer: Self-pay | Admitting: Obstetrics and Gynecology

## 2019-02-15 ENCOUNTER — Other Ambulatory Visit: Payer: Self-pay

## 2019-02-15 VITALS — BP 111/69

## 2019-02-15 DIAGNOSIS — O0992 Supervision of high risk pregnancy, unspecified, second trimester: Secondary | ICD-10-CM

## 2019-02-15 DIAGNOSIS — O09299 Supervision of pregnancy with other poor reproductive or obstetric history, unspecified trimester: Secondary | ICD-10-CM | POA: Insufficient documentation

## 2019-02-15 DIAGNOSIS — O0990 Supervision of high risk pregnancy, unspecified, unspecified trimester: Secondary | ICD-10-CM

## 2019-02-15 DIAGNOSIS — Z8774 Personal history of (corrected) congenital malformations of heart and circulatory system: Secondary | ICD-10-CM

## 2019-02-15 DIAGNOSIS — Z3A22 22 weeks gestation of pregnancy: Secondary | ICD-10-CM

## 2019-02-15 DIAGNOSIS — Z8632 Personal history of gestational diabetes: Secondary | ICD-10-CM | POA: Insufficient documentation

## 2019-02-15 DIAGNOSIS — O09292 Supervision of pregnancy with other poor reproductive or obstetric history, second trimester: Secondary | ICD-10-CM

## 2019-02-15 NOTE — Progress Notes (Signed)
   TELEHEALTH VIRTUAL OBSTETRICS VISIT ENCOUNTER NOTE  I connected with Kimberly Reid on 02/15/19 at  3:35 PM EDT by telephone at home and verified that I am speaking with the correct person using two identifiers.   I discussed the limitations, risks, security and privacy concerns of performing an evaluation and management service by telephone and the availability of in person appointments. I also discussed with the patient that there may be a patient responsible charge related to this service. The patient expressed understanding and agreed to proceed.  Subjective:  Kimberly Reid is a 28 y.o. G2P1001 at [redacted]w[redacted]d being followed for ongoing prenatal care.  She is currently monitored for the following issues for this high-risk pregnancy and has PCOS (polycystic ovarian syndrome); Insulin resistance; History of repair of coarctation of aorta; Supervision of high risk pregnancy, antepartum; and History of gestational diabetes in prior pregnancy, currently pregnant on their problem list.  Patient reports no complaints. Reports fetal movement. Denies any contractions, bleeding or leaking of fluid.   The following portions of the patient's history were reviewed and updated as appropriate: allergies, current medications, past family history, past medical history, past social history, past surgical history and problem list.   Objective:   General:  Alert, oriented and cooperative.   Mental Status: Normal mood and affect perceived. Normal judgment and thought content.  Rest of physical exam deferred due to type of encounter  Assessment and Plan:  Pregnancy: G2P1001 at [redacted]w[redacted]d 1. Supervision of high risk pregnancy, antepartum Stable 28 week labs with next visit  2. History of repair of coarctation of aorta Fetal ECHO was been ordered  3. History of gestational diabetes in prior pregnancy, currently pregnant HgbA1c normal  Preterm labor symptoms and general obstetric precautions including  but not limited to vaginal bleeding, contractions, leaking of fluid and fetal movement were reviewed in detail with the patient.  I discussed the assessment and treatment plan with the patient. The patient was provided an opportunity to ask questions and all were answered. The patient agreed with the plan and demonstrated an understanding of the instructions. The patient was advised to call back or seek an in-person office evaluation/go to [facility] at [facility] for any urgent or concerning symptoms. Please refer to After Visit Summary for other counseling recommendations.   I provided 11 minutes of non-face-to-face time during this encounter. Spanish interrupter used during today's visit  Return in about 6 weeks (around 03/29/2019) for OB visit and 28 week labs.  Future Appointments  Date Time Provider Department Center  03/02/2019  3:50 PM Claiborne Rigg, NP [facility] None  03/05/2019 11:00 AM [facility] NURSE [facility]  03/05/2019 11:00 AM [facility] [place] 3 [facility]-US  04/20/2019  2:40 PM Crenshaw, Madolyn Frieze, MD [facility]    Hermina Staggers, MD Center for [facility], [facility]

## 2019-02-16 NOTE — Progress Notes (Unsigned)
Patient ID: Kimberly Reid, female   DOB: 1991-06-19, 28 y.o.   MRN: [id number]  [facility] to get patient scheduled for her fetal echo. Spoke with a lady that stated there scheduler is actually working from home so she would rely the message to him and he will be giving me a call back. Patient's name and DOB was given, along with my number that I could be reached at.

## 2019-03-01 ENCOUNTER — Encounter: Payer: Self-pay | Admitting: Nurse Practitioner

## 2019-03-02 ENCOUNTER — Ambulatory Visit: Payer: Self-pay | Attending: Nurse Practitioner | Admitting: Nurse Practitioner

## 2019-03-02 ENCOUNTER — Other Ambulatory Visit: Payer: Self-pay

## 2019-03-05 ENCOUNTER — Other Ambulatory Visit: Payer: Self-pay

## 2019-03-05 ENCOUNTER — Other Ambulatory Visit (HOSPITAL_COMMUNITY): Payer: Self-pay | Admitting: *Deleted

## 2019-03-05 ENCOUNTER — Ambulatory Visit (HOSPITAL_COMMUNITY)
Admission: RE | Admit: 2019-03-05 | Discharge: 2019-03-05 | Disposition: A | Discharge status: Home / Self Care | Payer: Self-pay | Source: Ambulatory Visit | Attending: Obstetrics and Gynecology | Admitting: Obstetrics and Gynecology

## 2019-03-05 ENCOUNTER — Ambulatory Visit (HOSPITAL_COMMUNITY): Payer: Self-pay | Admitting: *Deleted

## 2019-03-05 ENCOUNTER — Encounter (HOSPITAL_COMMUNITY): Payer: Self-pay

## 2019-03-05 VITALS — Temp 98.6°F

## 2019-03-05 DIAGNOSIS — O0990 Supervision of high risk pregnancy, unspecified, unspecified trimester: Secondary | ICD-10-CM

## 2019-03-05 DIAGNOSIS — O99212 Obesity complicating pregnancy, second trimester: Secondary | ICD-10-CM

## 2019-03-05 DIAGNOSIS — Z362 Encounter for other antenatal screening follow-up: Secondary | ICD-10-CM

## 2019-03-05 DIAGNOSIS — O09292 Supervision of pregnancy with other poor reproductive or obstetric history, second trimester: Secondary | ICD-10-CM

## 2019-03-05 DIAGNOSIS — O99412 Diseases of the circulatory system complicating pregnancy, second trimester: Secondary | ICD-10-CM

## 2019-03-05 DIAGNOSIS — Z3A25 25 weeks gestation of pregnancy: Secondary | ICD-10-CM

## 2019-03-15 NOTE — Progress Notes (Unsigned)
Called [facility] to confirm whether or not the patient had been scheduled for her fetal echo that I attempted to schedule back on 4/21. Receptionist verified that the patient had been scheduled on 5/28 @ 11:30.

## 2019-03-29 ENCOUNTER — Encounter: Payer: Self-pay | Admitting: Obstetrics & Gynecology

## 2019-03-29 ENCOUNTER — Other Ambulatory Visit: Payer: Self-pay

## 2019-03-29 ENCOUNTER — Ambulatory Visit (INDEPENDENT_AMBULATORY_CARE_PROVIDER_SITE_OTHER): Payer: Self-pay | Admitting: Obstetrics and Gynecology

## 2019-03-29 VITALS — BP 111/67 | HR 62 | Temp 98.4°F | Wt 205.4 lb

## 2019-03-29 DIAGNOSIS — Z23 Encounter for immunization: Secondary | ICD-10-CM

## 2019-03-29 DIAGNOSIS — O0990 Supervision of high risk pregnancy, unspecified, unspecified trimester: Secondary | ICD-10-CM

## 2019-03-29 DIAGNOSIS — Z789 Other specified health status: Secondary | ICD-10-CM

## 2019-03-29 DIAGNOSIS — Z3A28 28 weeks gestation of pregnancy: Secondary | ICD-10-CM

## 2019-03-29 DIAGNOSIS — O0993 Supervision of high risk pregnancy, unspecified, third trimester: Secondary | ICD-10-CM

## 2019-03-29 DIAGNOSIS — Z8774 Personal history of (corrected) congenital malformations of heart and circulatory system: Secondary | ICD-10-CM

## 2019-03-29 NOTE — Progress Notes (Signed)
Prenatal Visit Note Date: 03/29/2019 Clinic: [facility]  Subjective:  Kimberly Reid is a 28 y.o. G2P1001 at [redacted]w[redacted]d being seen today for ongoing prenatal care.  She is currently monitored for the following issues for this high-risk pregnancy and has PCOS (polycystic ovarian syndrome); Insulin resistance; History of repair of coarctation of aorta; Supervision of high risk pregnancy, antepartum; and History of gestational diabetes in prior pregnancy, currently pregnant on their problem list.  Patient reports no complaints.   Contractions: Not present. Vag. Bleeding: None.  Movement: Present. Denies leaking of fluid.   The following portions of the patient's history were reviewed and updated as appropriate: allergies, current medications, past family history, past medical history, past social history, past surgical history and problem list. Problem list updated.  Objective:   Vitals:   03/29/19 0820  BP: 111/67  Pulse: 62  Temp: 98.4 F (36.9 C)  Weight: 205 lb 6.4 oz (93.2 kg)    Fetal Status: Fetal Heart Rate (bpm): 132   Movement: Present     General:  Alert, oriented and cooperative. Patient is in no acute distress.  Skin: Skin is warm and dry. No rash noted.   Cardiovascular: Normal heart rate noted  Respiratory: Normal respiratory effort, no problems with respiration noted  Abdomen: Soft, gravid, appropriate for gestational age. Pain/Pressure: Absent     Pelvic:  Cervical exam deferred        Extremities: Normal range of motion.  Edema: Trace  Mental Status: Normal mood and affect. Normal behavior. Normal judgment and thought content.   Urinalysis:      Assessment and Plan:  Pregnancy: G2P1001 at [redacted]w[redacted]d  1. Supervision of high risk pregnancy, antepartum Routine care. 28wk labs today - Tdap vaccine greater than or equal to 7yo IM  2. History of repair of coarctation of aorta Neg peds and adult cardiology eval  Interpreter used  Preterm labor  symptoms and general obstetric precautions including but not limited to vaginal bleeding, contractions, leaking of fluid and fetal movement were reviewed in detail with the patient. Please refer to After Visit Summary for other counseling recommendations.  Return in about 2 weeks (around 04/12/2019) for 2-3wk in person rob.   Trilby Bing, MD

## 2019-03-30 LAB — GLUCOSE TOLERANCE, 2 HOURS W/ 1HR
Glucose, 1 hour: 150 mg/dL (ref 65–179)
Glucose, 2 hour: 112 mg/dL (ref 65–152)
Glucose, Fasting: 84 mg/dL (ref 65–91)

## 2019-03-30 LAB — CBC
Hematocrit: 38.1 % (ref 34.0–46.6)
Hemoglobin: 12.6 g/dL (ref 11.1–15.9)
MCH: 29.2 pg (ref 26.6–33.0)
MCHC: 33.1 g/dL (ref 31.5–35.7)
MCV: 88 fL (ref 79–97)
Platelets: 205 10*3/uL (ref 150–450)
RBC: 4.31 x10E6/uL (ref 3.77–5.28)
RDW: 12.8 % (ref 11.7–15.4)
WBC: 10 10*3/uL (ref 3.4–10.8)

## 2019-03-30 LAB — RPR: RPR Ser Ql: NONREACTIVE

## 2019-03-30 LAB — HIV ANTIBODY (ROUTINE TESTING W REFLEX): HIV Screen 4th Generation wRfx: NONREACTIVE

## 2019-04-07 ENCOUNTER — Telehealth: Payer: Self-pay | Admitting: Family Medicine

## 2019-04-07 NOTE — Telephone Encounter (Signed)
Called patient with Kimberly Reid, patient has downloaded the [employer] app

## 2019-04-12 ENCOUNTER — Ambulatory Visit (INDEPENDENT_AMBULATORY_CARE_PROVIDER_SITE_OTHER): Payer: Self-pay | Admitting: Obstetrics & Gynecology

## 2019-04-12 ENCOUNTER — Other Ambulatory Visit: Payer: Self-pay

## 2019-04-12 VITALS — BP 111/73 | HR 73

## 2019-04-12 DIAGNOSIS — O0993 Supervision of high risk pregnancy, unspecified, third trimester: Secondary | ICD-10-CM

## 2019-04-12 DIAGNOSIS — O0990 Supervision of high risk pregnancy, unspecified, unspecified trimester: Secondary | ICD-10-CM

## 2019-04-12 NOTE — Patient Instructions (Signed)

## 2019-04-12 NOTE — Progress Notes (Signed)
   TELEHEALTH VIRTUAL OBSTETRICS VISIT ENCOUNTER NOTE  I connected with Kimberly Reid on 04/12/19 at  8:15 AM EDT by telephone at home and verified that I am speaking with the correct person using two identifiers.   I discussed the limitations, risks, security and privacy concerns of performing an evaluation and management service by telephone and the availability of in person appointments. I also discussed with the patient that there may be a patient responsible charge related to this service. The patient expressed understanding and agreed to proceed.  Subjective:  Kimberly Reid is a 28 y.o. G2P1001 at [redacted]w[redacted]d being followed for ongoing prenatal care.  She is currently monitored for the following issues for this high-risk pregnancy and has PCOS (polycystic ovarian syndrome); Insulin resistance; History of repair of coarctation of aorta; Supervision of high risk pregnancy, antepartum; History of gestational diabetes in prior pregnancy, currently pregnant; and Language barrier on their problem list.  Patient reports left side pain. Reports fetal movement. Denies any contractions, bleeding or leaking of fluid.   The following portions of the patient's history were reviewed and updated as appropriate: allergies, current medications, past family history, past medical history, past social history, past surgical history and problem list.   Objective:   General:  Alert, oriented and cooperative.   Mental Status: Normal mood and affect perceived. Normal judgment and thought content.  Rest of physical exam deferred due to type of encounter  Assessment and Plan:  Pregnancy: G2P1001 at [redacted]w[redacted]d 1. Supervision of high risk pregnancy, antepartum MS pain noted, denies UC Preterm labor symptoms and general obstetric precautions including but not limited to vaginal bleeding, contractions, leaking of fluid and fetal movement were reviewed in detail with the patient.  I discussed the assessment and  treatment plan with the patient. The patient was provided an opportunity to ask questions and all were answered. The patient agreed with the plan and demonstrated an understanding of the instructions. The patient was advised to call back or seek an in-person office evaluation/go to MAU at [facility] for any urgent or concerning symptoms. Please refer to After Visit Summary for other counseling recommendations.   I provided 12 minutes of non-face-to-face time during this encounter.  No follow-ups on file.  Future Appointments  Date Time Provider [place]  04/20/2019  2:40 PM Lelon Perla, MD [facility]  04/23/2019  1:00 PM Zeba Newburg [facility]  04/23/2019  1:00 PM [place]    Emeterio Reeve, [place] for [place], [place]

## 2019-04-19 NOTE — Progress Notes (Deleted)
HPI: FU murmur and history of repair of coarctation of the aorta. Patient underwent repair of coarctation at approximately 308 months of age [place]  Echocardiogram March 2020 showed normal LV systolic function, mild left ventricular enlargement, status post repair of coarctation with residual gradient in descending aorta (peak velocity 2.9 m/s, peak gradient 34 mmHg and mean gradient 21 mmHg).  MRA April 2020 showed normal caliber thoracic aorta with no aneurysmal disease or obvious recurrent coarctation.  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  Since last seen  Current Outpatient Medications  Medication Sig Dispense Refill  . Prenatal Vit-Fe Fumarate-FA (MULTIVITAMIN-PRENATAL) 27-0.8 MG TABS tablet Take 1 tablet by mouth daily at 12 noon.     No current facility-administered medications for this visit.      Past Medical History:  Diagnosis Date  . 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 Gestational diabetes   . Heart murmur   . Polycystic ovary syndrome 03/24/2013    Past Surgical History:  Procedure Laterality Date  . CARDIAC SURGERY    . COARCTATION OF AORTA REPAIR      Social History   Socioeconomic History  . Marital status: Married    Spouse name: Not on file  . Number of children: 1  . Years of education: Not on file  . Highest education level: Not on file  Occupational History  . Not on file  Social Needs  . Financial resource strain: Not on file  . Food insecurity    Worry: Not on file    Inability: Not on file  . Transportation needs    Medical: Not on file    Non-medical: Not on file  Tobacco Use  . Smoking status: Never Smoker  . Smokeless tobacco: Never Used  Substance and Sexual Activity  . Alcohol use: Not Currently    Frequency: Never  . Drug use: No  . Sexual activity: Yes    Birth control/protection: None  Lifestyle  . Physical activity    Days per week: Not on file    Minutes per session: Not on file  . Stress: Not on  file  Relationships  . Social Musicianconnections    Talks on phone: Not on file    Gets together: Not on file    Attends religious service: Not on file    Active member of club or organization: Not on file    Attends meetings of clubs or organizations: Not on file    Relationship status: Not on file  . Intimate partner violence    Fear of current or ex partner: Not on file    Emotionally abused: Not on file    Physically abused: Not on file    Forced sexual activity: Not on file  Other Topics Concern  . Not on file  Social History Narrative  . Not on file    Family History  Problem Relation Age of Onset  . Diabetes Mother   . Congenital Murmur Sister     ROS: no fevers or chills, productive cough, hemoptysis, dysphasia, odynophagia, melena, hematochezia, dysuria, hematuria, rash, seizure activity, orthopnea, PND, pedal edema, claudication. Remaining systems are negative.  Physical Exam: Well-developed well-nourished in no acute distress.  Skin is warm and dry.  HEENT is normal.  Neck is supple.  Chest is clear to auscultation with normal expansion.  Cardiovascular exam is regular rate and rhythm.  Abdominal exam nontender or distended. No masses palpated. Extremities show  no edema. neuro grossly intact  ECG- personally reviewed  A/P  1 history of coarctation repair-patient has no symptoms and MRA shows no aneurysm or recurrent coarctation.  There is note of increased gradient with transthoracic echocardiogram.  There is no contraindications to vaginal delivery.  She will need follow-up CTA or MRA in the future.  2 intrauterine pregnancy-management per obstetrics.  Kirk Ruths, MD

## 2019-04-20 ENCOUNTER — Ambulatory Visit: Payer: Self-pay | Admitting: Cardiology

## 2019-04-22 ENCOUNTER — Telehealth: Payer: Self-pay | Admitting: Family Medicine

## 2019-04-22 NOTE — Telephone Encounter (Signed)
Patient called asking about her ultrasound. I spoke to her about having a WebEx visit. She could only do 9:00 am.

## 2019-04-23 ENCOUNTER — Encounter (HOSPITAL_COMMUNITY): Payer: Self-pay | Admitting: *Deleted

## 2019-04-23 ENCOUNTER — Ambulatory Visit (HOSPITAL_COMMUNITY): Payer: Self-pay | Admitting: *Deleted

## 2019-04-23 ENCOUNTER — Ambulatory Visit (HOSPITAL_COMMUNITY)
Admission: RE | Admit: 2019-04-23 | Discharge: 2019-04-23 | Disposition: A | Discharge status: Home / Self Care | Payer: Self-pay | Source: Ambulatory Visit | Attending: Obstetrics and Gynecology | Admitting: Obstetrics and Gynecology

## 2019-04-23 ENCOUNTER — Other Ambulatory Visit: Payer: Self-pay

## 2019-04-23 VITALS — BP 114/56 | HR 84 | Temp 98.6°F

## 2019-04-23 DIAGNOSIS — Z3A32 32 weeks gestation of pregnancy: Secondary | ICD-10-CM

## 2019-04-23 DIAGNOSIS — O99413 Diseases of the circulatory system complicating pregnancy, third trimester: Secondary | ICD-10-CM

## 2019-04-23 DIAGNOSIS — Z3689 Encounter for other specified antenatal screening: Secondary | ICD-10-CM

## 2019-04-23 DIAGNOSIS — Z362 Encounter for other antenatal screening follow-up: Secondary | ICD-10-CM | POA: Insufficient documentation

## 2019-04-23 DIAGNOSIS — O09293 Supervision of pregnancy with other poor reproductive or obstetric history, third trimester: Secondary | ICD-10-CM

## 2019-04-23 DIAGNOSIS — O99212 Obesity complicating pregnancy, second trimester: Secondary | ICD-10-CM

## 2019-05-03 ENCOUNTER — Telehealth: Payer: Self-pay | Admitting: Medical

## 2019-05-03 NOTE — Telephone Encounter (Signed)
Called patient with interpreter [phone]. She was instructed about her appointment.

## 2019-05-04 ENCOUNTER — Ambulatory Visit (INDEPENDENT_AMBULATORY_CARE_PROVIDER_SITE_OTHER): Payer: Self-pay | Admitting: Medical

## 2019-05-04 ENCOUNTER — Encounter: Payer: Self-pay | Admitting: Medical

## 2019-05-04 ENCOUNTER — Other Ambulatory Visit: Payer: Self-pay

## 2019-05-04 VITALS — BP 122/81 | HR 80

## 2019-05-04 DIAGNOSIS — Z3A33 33 weeks gestation of pregnancy: Secondary | ICD-10-CM

## 2019-05-04 DIAGNOSIS — O09299 Supervision of pregnancy with other poor reproductive or obstetric history, unspecified trimester: Secondary | ICD-10-CM

## 2019-05-04 DIAGNOSIS — Z8774 Personal history of (corrected) congenital malformations of heart and circulatory system: Secondary | ICD-10-CM

## 2019-05-04 DIAGNOSIS — Z8632 Personal history of gestational diabetes: Secondary | ICD-10-CM

## 2019-05-04 DIAGNOSIS — O0993 Supervision of high risk pregnancy, unspecified, third trimester: Secondary | ICD-10-CM

## 2019-05-04 DIAGNOSIS — O09293 Supervision of pregnancy with other poor reproductive or obstetric history, third trimester: Secondary | ICD-10-CM

## 2019-05-04 DIAGNOSIS — O0990 Supervision of high risk pregnancy, unspecified, unspecified trimester: Secondary | ICD-10-CM

## 2019-05-04 DIAGNOSIS — Z789 Other specified health status: Secondary | ICD-10-CM

## 2019-05-04 NOTE — Progress Notes (Signed)
I connected with  Kimberly Reid on 05/04/19 at  2:15 PM EDT by telephone and verified that I am speaking with the correct person using two identifiers.   I discussed the limitations, risks, security and privacy concerns of performing an evaluation and management service by telephone and the availability of in person appointments. I also discussed with the patient that there may be a patient responsible charge related to this service. The patient expressed understanding and agreed to proceed.  [place], [place] 05/04/2019  2:14 PM

## 2019-05-04 NOTE — Patient Instructions (Addendum)
Evaluacin de los movimientos fetales Fetal Movement Counts Introduccin Nombre del paciente: ________________________________________________ Kimberly Reid estimada: ____________________ Kimberly Reid evaluacin de los movimientos fetales?  Una evaluacin de los movimientos fetales es el registro del nmero de veces que siente que el beb se mueve durante un cierto perodo de [place]. Esto tambin se puede denominar recuento de patadas fetales. Una evaluacin de movimientos fetales se recomienda a todas las embarazadas. Es posible que le indiquen que comience a Development worker, community los movimientos fetales desde la semana 28 de [place]. Preste atencin cuando sienta que el beb est ms activo. Podr detectar los ciclos en que el beb duerme y est despierto. Tambin podr detectar que ciertas cosas hacen que su beb se mueva ms. Deber realizar una evaluacin de los movimientos fetales en las siguientes situaciones:  Cuando el beb est ms activo habitualmente.  A la [employer], todos los [place]. Un buen momento para evaluar los movimientos fetales es cuando est descansando, despus de haber comido y bebido algo. Cmo debo contar los movimientos fetales? 1. Encuentre un lugar tranquilo y cmodo. Sintese o acustese de lado. 2. Anote la fecha, la hora de inicio y de finalizacin y la cantidad de movimientos que sinti entre esas dos horas. Lleve esta informacin a las visitas de control. 3. Cuente las pataditas, revoloteos, chasquidos, vueltas o pinchazos en un perodo de 2horas. Debe sentir al menos en 2horas. 4. Cuando sienta , puede dejar de contar. 5. Si no siente en 2horas, coma y beba algo. Luego, contine descansando y Industrial/product designer. Si siente al menos durante esa hora, puede dejar de contar. Comunquese con un mdico si:  Siente menos de en 2horas.  El beb no se mueve tanto como suele hacerlo. Fecha:  ____________ Stevan Born inicio: ____________ Stevan Born finalizacin: ____________ Movimientos: ____________ Franco Nones: ____________ Stevan Born inicio: ____________ Stevan Born finalizacin: ____________ Movimientos: ____________ Franco Nones: ____________ Stevan Born inicio: ____________ Stevan Born finalizacin: ____________ Movimientos: ____________ Franco Nones: ____________ Stevan Born inicio: ____________ Stevan Born finalizacin: ____________ Movimientos: ____________ Franco Nones: ____________ Stevan Born inicio: ____________ Mammie Russian de finalizacin: ____________ Movimientos: ____________ Franco Nones: ____________ Stevan Born inicio: ____________ Mammie Russian de finalizacin: ____________ Movimientos: ____________ Franco Nones: ____________ Stevan Born inicio: ____________ Mammie Russian de finalizacin: ____________ Movimientos: ____________ Franco Nones: ____________ Stevan Born inicio: ____________ Stevan Born finalizacin: ____________ Movimientos: ____________ Franco Nones: ____________ Stevan Born inicio: ____________ Mammie Russian de finalizacin: ____________ Movimientos: ____________ Esta informacin no tiene como fin reemplazar el consejo del mdico. Asegrese de hacerle al mdico cualquier pregunta que tenga. Document Released: 01/21/2008 Document Revised: 01/17/2017 Document Reviewed: 11/23/2015 Elsevier Patient Education  2020 [employer]. [employer] de Braxton Hicks Braxton Hicks Contractions Las contracciones del tero pueden presentarse durante todo el [place], [place] no siempre indican que la mujer est de [place]. Es posible que usted haya tenido contracciones de prctica llamadas "contracciones de Varnell". A veces, se las confunde con el parto real. Qu son las contracciones de [place]? Las contracciones de [place] son espasmos que se producen en los msculos del tero antes del [place]. A diferencia de las contracciones del parto verdadero, estas no producen el agrandamiento (la dilatacin) ni el afinamiento del cuello uterino. Hacia el final del embarazo [facility] las semanas [phone]),  las contracciones de Braxton Hicks pueden presentarse ms seguido y tornarse ms intensas. A veces, resulta difcil distinguirlas del parto verdadero porque pueden ser [employer]. No debe sentirse avergonzada si concurre al hospital con falso parto. En ocasiones, la nica forma de saber si el Aleen Campi  de parto es verdadero es que el mdico determine si hay cambios en el cuello del tero. El mdico le har un examen fsico y quizs le controle las contracciones. Si usted no est de parto verdadero, el examen debe indicar que el cuello uterino no est dilatado y que usted no ha roto bolsa. Si no hay otros problemas de salud asociados con su embarazo, no habr inconvenientes si la envan a su casa con un falso parto. Es posible que las contracciones de Braxton Hicks continen hasta que se desencadene el parto verdadero. Cmo diferenciar el trabajo de parto falso del verdadero Trabajo de parto verdadero  Las contracciones duran de 30a70segundos.  Las contracciones pueden tornarse muy regulares.  La molestia generalmente se siente en la parte superior del tero y se extiende hacia la zona baja del abdomen y hacia la cintura.  Las contracciones no desaparecen cuando usted camina.  Las contracciones generalmente se hacen ms intensas y aumentan en frecuencia.  El cuello uterino se dilata y se afina. Parto falso  En general, las contracciones son ms cortas y no tan intensas como las del parto verdadero.  En general, las contracciones son irregulares.  A menudo, las contracciones se sienten en la parte delantera de la parte baja del abdomen y en la ingle.  Las contracciones pueden desaparecer cuando usted camina o cambia de posicin mientras est acostada.  Las contracciones se vuelven ms dbiles y su duracin es menor a medida que transcurre el tiempo.  En general, el cuello uterino no se dilata ni se afina. Siga estas indicaciones en su casa:   Tome los medicamentos de venta libre y  los recetados solamente como se lo haya indicado el mdico.  Contine haciendo los ejercicios habituales y siga las dems indicaciones que el mdico le d.  Coma y beba con moderacin si cree que est de parto.  Si las contracciones de Braxton Hicks le provocan incomodidad: ? Cambie de posicin: si est acostada o descansando, camine; si est caminando, descanse. ? Sintese y descanse en una baera con agua tibia. ? Beba suficiente lquido como para mantener la orina de color amarillo plido. La deshidratacin puede provocar contracciones. ? Respire lenta y profundamente varias veces por hora.  Vaya a todas las visitas de control prenatales y de control como se lo haya indicado el mdico. Esto es importante. Comunquese con un mdico si:  Tiene fiebre.  Siente dolor constante en el abdomen. Solicite ayuda de inmediato si:  Las contracciones se intensifican, se hacen ms regulares y cercanas entre s.  Tiene una prdida de lquido por la vagina.  Elimina una mucosidad sanguinolenta (prdida del tapn mucoso).  Tiene una hemorragia vaginal.  Tiene un dolor en la zona lumbar que nunca tuvo antes.  Siente que la cabeza del beb empuja hacia abajo y ejerce presin en la zona plvica.  El beb no se mueve tanto como antes. Resumen  Las contracciones que se presentan antes del parto se conocen como contracciones de Braxton Hicks, falso parto o contracciones de prctica.  En general, las contracciones de Braxton Hicks son ms cortas, ms dbiles, con ms tiempo entre una y otra, y menos regulares que las contracciones del parto verdadero. Las contracciones del parto verdadero se intensifican progresivamente y se tornan regulares y ms frecuentes.  Para controlar la molestia que producen las contracciones de Braxton Hicks, puede cambiar de posicin, darse un bao templado y descansar, beber mucha agua o practicar la respiracin profunda. Esta informacin no tiene como   fin reemplazar el  consejo del mdico. Asegrese de hacerle al mdico cualquier pregunta que tenga. Document Released: 05/26/2017 Document Revised: 01/23/2018 Document Reviewed: 05/26/2017 Elsevier Patient Education  2020 [employer].

## 2019-05-04 NOTE — Progress Notes (Signed)
I connected with Kimberly Reid on 05/04/19 at  2:15 PM EDT by: WebEx and verified that I am speaking with the correct person using two identifiers.  Patient is located at home and provider is located at [facility].     The purpose of this virtual visit is to provide medical care while limiting exposure to the novel coronavirus. I discussed the limitations, risks, security and privacy concerns of performing an evaluation and management service by WebEx and the availability of in person appointments. I also discussed with the patient that there may be a patient responsible charge related to this service. By engaging in this virtual visit, you consent to the provision of healthcare.  Additionally, you authorize for your insurance to be billed for the services provided during this visit.  The patient expressed understanding and agreed to proceed.  The following staff members participated in the virtual visit:  Lowanda Foster    PRENATAL VISIT NOTE  Subjective:  Kimberly Reid is a 28 y.o. G2P1001 at [redacted]w[redacted]d  for phone visit for ongoing prenatal care.  She is currently monitored for the following issues for this high-risk pregnancy and has PCOS (polycystic ovarian syndrome); Insulin resistance; History of repair of coarctation of aorta; Supervision of high risk pregnancy, antepartum; History of gestational diabetes in prior pregnancy, currently pregnant; and Language barrier on their problem list.  Patient reports no complaints.  Contractions: Not present. Vag. Bleeding: None.  Movement: Present. Denies leaking of fluid.   The following portions of the patient's history were reviewed and updated as appropriate: allergies, current medications, past family history, past medical history, past social history, past surgical history and problem list.   Objective:   Vitals:   05/04/19 1415  BP: 122/81  Pulse: 80   Self-Obtained  Fetal Status:     Movement: Present     Assessment and Plan:   Pregnancy: G2P1001 at [redacted]w[redacted]d 1. Supervision of high risk pregnancy, antepartum - Doing well, few BH contractions, no bleeding, LOF, +FM  2. History of repair of coarctation of aorta  3. History of gestational diabetes in prior pregnancy, currently pregnant - Passed 2 hour GTT with this pregnancy   4. Language barrier - Eda Royal interpreter present   Preterm labor symptoms and general obstetric precautions including but not limited to vaginal bleeding, contractions, leaking of fluid and fetal movement were reviewed in detail with the patient.  Return in about 16 days (around 05/20/2019) for LOB, In-Person, will need GBS and GC/Chlamydia .  No future appointments.   Time spent on virtual visit: 10 minutes  Kerry Hough, PA-C

## 2019-05-06 ENCOUNTER — Encounter: Payer: Self-pay | Admitting: Internal Medicine

## 2019-05-12 ENCOUNTER — Other Ambulatory Visit: Payer: Self-pay | Admitting: Family Medicine

## 2019-05-12 DIAGNOSIS — Z20822 Contact with and (suspected) exposure to covid-19: Secondary | ICD-10-CM

## 2019-05-16 LAB — NOVEL CORONAVIRUS, NAA: SARS-CoV-2, NAA: DETECTED — AB

## 2019-05-19 ENCOUNTER — Telehealth: Payer: Self-pay | Admitting: Obstetrics & Gynecology

## 2019-05-19 NOTE — Telephone Encounter (Signed)
Called the patient to inform of upcoming virtual visit. Informed the patient of the date and time and please be sure the [employer] Webex Meeting app is downloaded. If you have any questions or concerns please contact our office at [phone]. °

## 2019-05-20 ENCOUNTER — Ambulatory Visit (INDEPENDENT_AMBULATORY_CARE_PROVIDER_SITE_OTHER): Payer: Self-pay | Admitting: Obstetrics and Gynecology

## 2019-05-20 ENCOUNTER — Other Ambulatory Visit: Payer: Self-pay

## 2019-05-20 VITALS — BP 115/85 | HR 80

## 2019-05-20 DIAGNOSIS — U071 COVID-19: Secondary | ICD-10-CM | POA: Insufficient documentation

## 2019-05-20 DIAGNOSIS — Z3A35 35 weeks gestation of pregnancy: Secondary | ICD-10-CM

## 2019-05-20 DIAGNOSIS — O98513 Other viral diseases complicating pregnancy, third trimester: Secondary | ICD-10-CM

## 2019-05-20 DIAGNOSIS — Z789 Other specified health status: Secondary | ICD-10-CM

## 2019-05-20 DIAGNOSIS — O0990 Supervision of high risk pregnancy, unspecified, unspecified trimester: Secondary | ICD-10-CM

## 2019-05-20 HISTORY — DX: COVID-19: U07.1

## 2019-05-20 NOTE — Progress Notes (Signed)
   TELEHEALTH VIRTUAL OBSTETRICS VISIT ENCOUNTER NOTE  I connected with [name] on 05/20/19 at 10:55 AM EDT by telephone at home and verified that I am speaking with the correct person using two identifiers.   I discussed the limitations, risks, security and privacy concerns of performing an evaluation and management service by telephone and the availability of in person appointments. I also discussed with the patient that there may be a patient responsible charge related to this service. The patient expressed understanding and agreed to proceed.  Subjective:  Kimberly Reid is a 28 y.o. G2P1001 at [redacted]w[redacted]d being followed for ongoing prenatal care.  She is currently monitored for the following issues for this high-risk pregnancy and has PCOS (polycystic ovarian syndrome); Insulin resistance; History of repair of coarctation of aorta; Supervision of high risk pregnancy, antepartum; History of gestational diabetes in prior pregnancy, currently pregnant; Language barrier; and COVID-19 virus infection on their problem list.   Patient reports no complaints. Reports fetal movement. Denies any contractions, bleeding or leaking of fluid.   The following portions of the patient's history were reviewed and updated as appropriate: allergies, current medications, past family history, past medical history, past social history, past surgical history and problem list.   Objective:   General:  Alert, oriented and cooperative.   Mental Status: Normal mood and affect perceived. Normal judgment and thought content.  Rest of physical exam deferred due to type of encounter  Assessment and Plan:  Pregnancy: G2P1001 at [redacted]w[redacted]d  1. COVID-19 virus infection  Positive covid on 7/15 Virtual visit today.  BP 115/85, pulse 80  2. Supervision of high risk pregnancy, antepartum  Return to the office 10 days from 7/15 if no symptoms around 7/25 for GBS  3. Language barrier  Phone interpretor used  Unable to connect to [employer].   Preterm labor symptoms and general obstetric precautions including but not limited to vaginal bleeding, contractions, leaking of fluid and fetal movement were reviewed in detail with the patient.  I discussed the assessment and treatment plan with the patient. The patient was provided an opportunity to ask questions and all were answered. The patient agreed with the plan and demonstrated an understanding of the instructions. The patient was advised to call back or seek an in-person office evaluation/go to MAU at [facility] for any urgent or concerning symptoms. Please refer to After Visit Summary for other counseling recommendations.   I provided 12 minutes of non-face-to-face time during this encounter.  No follow-ups on file.  Future Appointments  Date Time Provider [place]  05/27/2019  3:35 PM Lauran Romanski, Artist Pais, NP [facility]  06/03/2019 10:35 AM Ninnie Fein, Artist Pais, NP [place], NP [facility] for [employer] [facility] [place] [facility]

## 2019-05-24 NOTE — Progress Notes (Signed)
Thank you. Noted.

## 2019-05-27 ENCOUNTER — Ambulatory Visit (INDEPENDENT_AMBULATORY_CARE_PROVIDER_SITE_OTHER): Payer: Self-pay | Admitting: Obstetrics and Gynecology

## 2019-05-27 ENCOUNTER — Other Ambulatory Visit: Payer: Self-pay

## 2019-05-27 VITALS — BP 109/73 | HR 92 | Temp 98.0°F | Wt 218.2 lb

## 2019-05-27 DIAGNOSIS — Z113 Encounter for screening for infections with a predominantly sexual mode of transmission: Secondary | ICD-10-CM

## 2019-05-27 DIAGNOSIS — O09293 Supervision of pregnancy with other poor reproductive or obstetric history, third trimester: Secondary | ICD-10-CM

## 2019-05-27 DIAGNOSIS — O0993 Supervision of high risk pregnancy, unspecified, third trimester: Secondary | ICD-10-CM

## 2019-05-27 DIAGNOSIS — O09299 Supervision of pregnancy with other poor reproductive or obstetric history, unspecified trimester: Secondary | ICD-10-CM

## 2019-05-27 DIAGNOSIS — O0990 Supervision of high risk pregnancy, unspecified, unspecified trimester: Secondary | ICD-10-CM

## 2019-05-27 DIAGNOSIS — Z3A36 36 weeks gestation of pregnancy: Secondary | ICD-10-CM

## 2019-05-27 DIAGNOSIS — Z8632 Personal history of gestational diabetes: Secondary | ICD-10-CM

## 2019-05-27 LAB — OB RESULTS CONSOLE GBS: GBS: NEGATIVE

## 2019-05-27 LAB — OB RESULTS CONSOLE GC/CHLAMYDIA: Gonorrhea: NEGATIVE

## 2019-05-27 NOTE — Patient Instructions (Signed)

## 2019-05-27 NOTE — Progress Notes (Signed)
   PRENATAL VISIT NOTE  Subjective:  Kimberly Reid is a 28 y.o. G2P1001 at [redacted]w[redacted]d being seen today for ongoing prenatal care.  She is currently monitored for the following issues for this high-risk pregnancy and has PCOS (polycystic ovarian syndrome); Insulin resistance; History of repair of coarctation of aorta; Supervision of high risk pregnancy, antepartum; History of gestational diabetes in prior pregnancy, currently pregnant; Language barrier; and COVID-19 virus infection on their problem list.  Patient reports no complaints.  Contractions: Not present. Vag. Bleeding: None.  Movement: Present. Denies leaking of fluid.   The following portions of the patient's history were reviewed and updated as appropriate: allergies, current medications, past family history, past medical history, past social history, past surgical history and problem list.   Objective:   Vitals:   05/27/19 1600  BP: 109/73  Pulse: 92  Temp: 98 F (36.7 C)  Weight: 218 lb 3.2 oz (99 kg)    Fetal Status: Fetal Heart Rate (bpm): 166 Fundal Height: 37 cm Movement: Present  Presentation: Undeterminable  General:  Alert, oriented and cooperative. Patient is in no acute distress.  Skin: Skin is warm and dry. No rash noted.   Cardiovascular: Normal heart rate noted  Respiratory: Normal respiratory effort, no problems with respiration noted  Abdomen: Soft, gravid, appropriate for gestational age.  Pain/Pressure: Present     Pelvic: Cervical exam performed Dilation: Closed Effacement (%): Thick    Extremities: Normal range of motion.  Edema: Trace  Mental Status: Normal mood and affect. Normal behavior. Normal judgment and thought content.   Assessment and Plan:  Pregnancy: G2P1001 at [redacted]w[redacted]d 1. Supervision of high risk pregnancy, antepartum  - GC/Chlamydia probe amp ([place])not at [facility] - Culture, beta strep (group b only)  Preterm labor symptoms and general obstetric precautions including but not limited  to vaginal bleeding, contractions, leaking of fluid and fetal movement were reviewed in detail with the patient. Please refer to After Visit Summary for other counseling recommendations.   No follow-ups on file.  Future Appointments  Date Time Provider [place]  06/03/2019 10:35 AM Nevena Rozenberg, Artist Pais, NP [facility]    Noni Saupe, NP

## 2019-05-29 LAB — GC/CHLAMYDIA PROBE AMP (~~LOC~~) NOT AT ARMC
Chlamydia: NEGATIVE
Neisseria Gonorrhea: NEGATIVE

## 2019-05-30 LAB — CULTURE, BETA STREP (GROUP B ONLY): Strep Gp B Culture: NEGATIVE

## 2019-06-03 ENCOUNTER — Ambulatory Visit (INDEPENDENT_AMBULATORY_CARE_PROVIDER_SITE_OTHER): Payer: Self-pay | Admitting: Obstetrics and Gynecology

## 2019-06-03 ENCOUNTER — Other Ambulatory Visit: Payer: Self-pay

## 2019-06-03 VITALS — BP 127/89 | HR 78

## 2019-06-03 DIAGNOSIS — Z3A37 37 weeks gestation of pregnancy: Secondary | ICD-10-CM

## 2019-06-03 DIAGNOSIS — O09293 Supervision of pregnancy with other poor reproductive or obstetric history, third trimester: Secondary | ICD-10-CM

## 2019-06-03 DIAGNOSIS — O0990 Supervision of high risk pregnancy, unspecified, unspecified trimester: Secondary | ICD-10-CM

## 2019-06-03 DIAGNOSIS — Z789 Other specified health status: Secondary | ICD-10-CM

## 2019-06-03 DIAGNOSIS — O0993 Supervision of high risk pregnancy, unspecified, third trimester: Secondary | ICD-10-CM

## 2019-06-03 MED ORDER — HYDROXYZINE PAMOATE 25 MG PO CAPS: 25.0000 mg | ORAL_CAPSULE | Freq: Every evening | ORAL | 0 refills | Status: DC | PRN
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
## 2019-06-03 NOTE — Progress Notes (Signed)
Marcie #[id number] [place] interpreters I connected with  Nafisa Olds on 06/03/19 at 10:35 AM EDT by telephone and verified that I am speaking with the correct person using two identifiers.   I discussed the limitations, risks, security and privacy concerns of performing an evaluation and management service by telephone and the availability of in person appointments. I also discussed with the patient that there may be a patient responsible charge related to this service. The patient expressed understanding and agreed to proceed.  [place], [place] 06/03/2019  10:41 AM

## 2019-06-09 ENCOUNTER — Telehealth: Payer: Self-pay | Admitting: Obstetrics and Gynecology

## 2019-06-09 NOTE — Telephone Encounter (Signed)
Eda called and spoke to patient about her appointment on 8/13 @ 3:35. Patient instructed visit is virtual and the app is already downloaded.

## 2019-06-10 ENCOUNTER — Ambulatory Visit: Payer: Self-pay | Admitting: Obstetrics and Gynecology

## 2019-06-10 ENCOUNTER — Inpatient Hospital Stay (HOSPITAL_COMMUNITY)
Admission: AD | Admit: 2019-06-10 | Discharge: 2019-06-10 | Disposition: A | Discharge status: Home / Self Care | Payer: Self-pay | Attending: Obstetrics & Gynecology | Admitting: Obstetrics & Gynecology

## 2019-06-10 ENCOUNTER — Other Ambulatory Visit: Payer: Self-pay

## 2019-06-10 ENCOUNTER — Encounter (HOSPITAL_COMMUNITY): Payer: Self-pay | Admitting: *Deleted

## 2019-06-10 DIAGNOSIS — O471 False labor at or after 37 completed weeks of gestation: Secondary | ICD-10-CM | POA: Insufficient documentation

## 2019-06-10 DIAGNOSIS — O479 False labor, unspecified: Secondary | ICD-10-CM

## 2019-06-10 DIAGNOSIS — Z3A38 38 weeks gestation of pregnancy: Secondary | ICD-10-CM | POA: Insufficient documentation

## 2019-06-10 NOTE — Progress Notes (Signed)
I connected with  Kimberly Reid on 06/10/19 at  3:35 PM EDT by telephone and verified that I am speaking with the correct person using two identifiers.   I discussed the limitations, risks, security and privacy concerns of performing an evaluation and management service by telephone and the availability of in person appointments. I also discussed with the patient that there may be a patient responsible charge related to this service. The patient expressed understanding and agreed to proceed.  Alric Seton, [place] 06/10/2019  3:36 PM   Going to the MAU for evaluation on her own. States she has had contractions all night.

## 2019-06-10 NOTE — MAU Note (Signed)
I have communicated with J.Wenzle, PA and reviewed vital signs:  Vitals:   06/10/19 1613  BP: 118/67  Pulse: 71  Resp: 16  Temp: 98.1 F (36.7 C)  SpO2: 99%    Vaginal exam:  Dilation: Closed Effacement (%): 30 Cervical Position: Posterior Station: Ballotable Exam by:: K.Bailei Buist,RN,   Also reviewed contraction pattern and that non-stress test is reactive.  It has been documented that patient is contracting every irregularly  with no cervical change from check in office last week. Pt cervix is still closed not indicating active labor.  Patient denies any other complaints.  Based on this report provider has given order for discharge.  A discharge order and diagnosis entered by a provider.   Labor discharge instructions reviewed with patient.

## 2019-06-10 NOTE — MAU Note (Signed)
Started last night, pelvic pressure.  Had a little bit of blood and d/c.  About 0100, had contraction, not again until 0500.  All morning having pain and pressure in pelvis, no more contractions. No more bleeding.  No recent exams. No water leaking.

## 2019-06-10 NOTE — MAU Provider Note (Signed)
S: Ms. Daniele Dillow is a 28 y.o. G2P1001 at [redacted]w[redacted]d  who presents to MAU today for labor evaluation.     Cervical exam by RN:  Dilation: Closed Effacement (%): 30 Cervical Position: Posterior Station: Ballotable Exam by:: T.OIZTIW,PY  Fetal Monitoring: Baseline: 130 bpm Variability: moderate Accelerations: 15 x 15 Decelerations: None  Contractions: few, irregular   MDM Discussed patient with RN. NST reviewed.   A: SIUP at [redacted]w[redacted]d  False labor  P: Discharge home Labor precautions and kick counts included in AVS Patient to follow-up with [facility] as scheduled for routine prenatal care Patient may return to MAU as needed or when in labor   Luvenia Redden, [place] 06/10/2019 5:05 PM

## 2019-06-10 NOTE — Progress Notes (Signed)
Patient did not keep her appointment today.   Lezlie Lye, NP 06/10/2019 3:43 PM

## 2019-06-10 NOTE — Discharge Instructions (Signed)
Fetal Movement Counts Patient Name: ________________________________________________ Patient Due Date: ____________________ What is a fetal movement count?  A fetal movement count is the number of times that you feel your baby move during a certain amount of time. This may also be called a fetal kick count. A fetal movement count is recommended for every pregnant woman. You may be asked to start counting fetal movements as early as week 28 of your pregnancy. Pay attention to when your baby is most active. You may notice your baby's sleep and wake cycles. You may also notice things that make your baby move more. You should do a fetal movement count:  When your baby is normally most active.  At the same time each day. A good time to count movements is while you are resting, after having something to eat and drink. How do I count fetal movements? 1. Find a quiet, comfortable area. Sit, or lie down on your side. 2. Write down the date, the start time and stop time, and the number of movements that you felt between those two times. Take this information with you to your health care visits. 3. For 2 hours, count kicks, flutters, swishes, rolls, and jabs. You should feel at least 10 movements during 2 hours. 4. You may stop counting after you have felt 10 movements. 5. If you do not feel 10 movements in 2 hours, have something to eat and drink. Then, keep resting and counting for 1 hour. If you feel at least 4 movements during that hour, you may stop counting. Contact a health care provider if:  You feel fewer than 4 movements in 2 hours.  Your baby is not moving like he or she usually does. Date: ____________ Start time: ____________ Stop time: ____________ Movements: ____________ Date: ____________ Start time: ____________ Stop time: ____________ Movements: ____________ Date: ____________ Start time: ____________ Stop time: ____________ Movements: ____________ Date: ____________ Start time:  ____________ Stop time: ____________ Movements: ____________ Date: ____________ Start time: ____________ Stop time: ____________ Movements: ____________ Date: ____________ Start time: ____________ Stop time: ____________ Movements: ____________ Date: ____________ Start time: ____________ Stop time: ____________ Movements: ____________ Date: ____________ Start time: ____________ Stop time: ____________ Movements: ____________ Date: ____________ Start time: ____________ Stop time: ____________ Movements: ____________ This information is not intended to replace advice given to you by your health care provider. Make sure you discuss any questions you have with your health care provider. Document Released: 11/13/2006 Document Revised: 11/03/2018 Document Reviewed: 11/23/2015 Elsevier Patient Education  2020 Elsevier Inc. Braxton Hicks Contractions Contractions of the uterus can occur throughout pregnancy, but they are not always a sign that you are in labor. You may have practice contractions called Braxton Hicks contractions. These false labor contractions are sometimes confused with true labor. What are Braxton Hicks contractions? Braxton Hicks contractions are tightening movements that occur in the muscles of the uterus before labor. Unlike true labor contractions, these contractions do not result in opening (dilation) and thinning of the cervix. Toward the end of pregnancy (32-34 weeks), Braxton Hicks contractions can happen more often and may become stronger. These contractions are sometimes difficult to tell apart from true labor because they can be very uncomfortable. You should not feel embarrassed if you go to the hospital with false labor. Sometimes, the only way to tell if you are in true labor is for your health care provider to look for changes in the cervix. The health care provider will do a physical exam and may monitor your contractions. If you   are not in true labor, the exam should show  that your cervix is not dilating and your water has not broken. If there are no other health problems associated with your pregnancy, it is completely safe for you to be sent home with false labor. You may continue to have Braxton Hicks contractions until you go into true labor. How to tell the difference between true labor and false labor True labor  Contractions last 30-70 seconds.  Contractions become very regular.  Discomfort is usually felt in the top of the uterus, and it spreads to the lower abdomen and low back.  Contractions do not go away with walking.  Contractions usually become more intense and increase in frequency.  The cervix dilates and gets thinner. False labor  Contractions are usually shorter and not as strong as true labor contractions.  Contractions are usually irregular.  Contractions are often felt in the front of the lower abdomen and in the groin.  Contractions may go away when you walk around or change positions while lying down.  Contractions get weaker and are shorter-lasting as time goes on.  The cervix usually does not dilate or become thin. Follow these instructions at home:   Take over-the-counter and prescription medicines only as told by your health care provider.  Keep up with your usual exercises and follow other instructions from your health care provider.  Eat and drink lightly if you think you are going into labor.  If Braxton Hicks contractions are making you uncomfortable: ? Change your position from lying down or resting to walking, or change from walking to resting. ? Sit and rest in a tub of warm water. ? Drink enough fluid to keep your urine pale yellow. Dehydration may cause these contractions. ? Do slow and deep breathing several times an hour.  Keep all follow-up prenatal visits as told by your health care provider. This is important. Contact a health care provider if:  You have a fever.  You have continuous pain in  your abdomen. Get help right away if:  Your contractions become stronger, more regular, and closer together.  You have fluid leaking or gushing from your vagina.  You pass blood-tinged mucus (bloody show).  You have bleeding from your vagina.  You have low back pain that you never had before.  You feel your baby's head pushing down and causing pelvic pressure.  Your baby is not moving inside you as much as it used to. Summary  Contractions that occur before labor are called Braxton Hicks contractions, false labor, or practice contractions.  Braxton Hicks contractions are usually shorter, weaker, farther apart, and less regular than true labor contractions. True labor contractions usually become progressively stronger and regular, and they become more frequent.  Manage discomfort from Braxton Hicks contractions by changing position, resting in a warm bath, drinking plenty of water, or practicing deep breathing. This information is not intended to replace advice given to you by your health care provider. Make sure you discuss any questions you have with your health care provider. Document Released: 02/27/2017 Document Revised: 09/26/2017 Document Reviewed: 02/27/2017 Elsevier Patient Education  2020 Elsevier Inc.  

## 2019-06-16 MED FILL — hydrOXYzine PAMOATE 25 MG C: 25 | 15 days supply | Qty: 30 | Fill #0

## 2019-06-17 ENCOUNTER — Inpatient Hospital Stay (HOSPITAL_COMMUNITY): Payer: Medicaid Other | Admitting: Anesthesiology

## 2019-06-17 ENCOUNTER — Encounter (HOSPITAL_COMMUNITY): Payer: Self-pay | Admitting: *Deleted

## 2019-06-17 ENCOUNTER — Other Ambulatory Visit: Payer: Self-pay

## 2019-06-17 ENCOUNTER — Telehealth: Payer: Self-pay | Admitting: Obstetrics and Gynecology

## 2019-06-17 ENCOUNTER — Inpatient Hospital Stay (HOSPITAL_COMMUNITY)
Admission: AD | Admit: 2019-06-17 | Discharge: 2019-06-20 | DRG: 805 | Disposition: A | Discharge status: Home / Self Care | Payer: Medicaid Other | Attending: Obstetrics and Gynecology | Admitting: Obstetrics and Gynecology

## 2019-06-17 ENCOUNTER — Encounter: Payer: Self-pay | Admitting: Obstetrics and Gynecology

## 2019-06-17 DIAGNOSIS — Z3A40 40 weeks gestation of pregnancy: Secondary | ICD-10-CM

## 2019-06-17 DIAGNOSIS — Z8774 Personal history of (corrected) congenital malformations of heart and circulatory system: Secondary | ICD-10-CM | POA: Diagnosis not present

## 2019-06-17 DIAGNOSIS — R079 Chest pain, unspecified: Secondary | ICD-10-CM | POA: Diagnosis not present

## 2019-06-17 DIAGNOSIS — O9852 Other viral diseases complicating childbirth: Principal | ICD-10-CM | POA: Diagnosis present

## 2019-06-17 DIAGNOSIS — O26893 Other specified pregnancy related conditions, third trimester: Secondary | ICD-10-CM | POA: Diagnosis present

## 2019-06-17 DIAGNOSIS — O9089 Other complications of the puerperium, not elsewhere classified: Secondary | ICD-10-CM | POA: Diagnosis not present

## 2019-06-17 DIAGNOSIS — Z3A39 39 weeks gestation of pregnancy: Secondary | ICD-10-CM | POA: Diagnosis not present

## 2019-06-17 DIAGNOSIS — Z3689 Encounter for other specified antenatal screening: Secondary | ICD-10-CM | POA: Diagnosis not present

## 2019-06-17 DIAGNOSIS — U071 COVID-19: Secondary | ICD-10-CM | POA: Diagnosis present

## 2019-06-17 HISTORY — DX: Polycystic ovarian syndrome: E28.2

## 2019-06-17 LAB — SARS CORONAVIRUS 2 BY RT PCR (HOSPITAL ORDER, PERFORMED IN ~~LOC~~ HOSPITAL LAB): SARS Coronavirus 2: POSITIVE — AB

## 2019-06-17 LAB — CBC
HCT: 37.8 % (ref 36.0–46.0)
Hemoglobin: 12.7 g/dL (ref 12.0–15.0)
MCH: 28.9 pg (ref 26.0–34.0)
MCHC: 33.6 g/dL (ref 30.0–36.0)
MCV: 85.9 fL (ref 80.0–100.0)
Platelets: 205 10*3/uL (ref 150–400)
RBC: 4.4 MIL/uL (ref 3.87–5.11)
RDW: 13.5 % (ref 11.5–15.5)
WBC: 8.9 10*3/uL (ref 4.0–10.5)
nRBC: 0 % (ref 0.0–0.2)

## 2019-06-17 LAB — TYPE AND SCREEN
ABO/RH(D): O POS
Antibody Screen: NEGATIVE

## 2019-06-17 LAB — ABO/RH: ABO/RH(D): O POS

## 2019-06-17 MED ORDER — OXYTOCIN BOLUS FROM INFUSION
500.0000 mL | Freq: Once | INTRAVENOUS | Status: AC
  Administered 2019-06-18: 500 mL via INTRAVENOUS

## 2019-06-17 MED ORDER — EPHEDRINE 5 MG/ML INJ: 10.0000 mg | INTRAVENOUS | Status: DC | PRN

## 2019-06-17 MED ORDER — DIPHENHYDRAMINE HCL 50 MG/ML IJ SOLN
12.5000 mg | INTRAMUSCULAR | Status: DC | PRN
  Administered 2019-06-18: 12.5 mg via INTRAVENOUS
  Filled 2019-06-17: qty 1

## 2019-06-17 MED ORDER — PHENYLEPHRINE 40 MCG/ML (10ML) SYRINGE FOR IV PUSH (FOR BLOOD PRESSURE SUPPORT): 80.0000 ug | PREFILLED_SYRINGE | INTRAVENOUS | Status: DC | PRN

## 2019-06-17 MED ORDER — SOD CITRATE-CITRIC ACID 500-334 MG/5ML PO SOLN: 30.0000 mL | ORAL | Status: DC | PRN

## 2019-06-17 MED ORDER — LACTATED RINGERS IV SOLN
INTRAVENOUS | Status: DC
  Administered 2019-06-17: 17:00:00 via INTRAVENOUS

## 2019-06-17 MED ORDER — OXYCODONE-ACETAMINOPHEN 5-325 MG PO TABS: 2.0000 | ORAL_TABLET | ORAL | Status: DC | PRN

## 2019-06-17 MED ORDER — ACETAMINOPHEN 325 MG PO TABS
650.0000 mg | ORAL_TABLET | ORAL | Status: DC | PRN
  Administered 2019-06-18 (×2): 650 mg via ORAL
  Filled 2019-06-17 (×2): qty 2

## 2019-06-17 MED ORDER — LACTATED RINGERS IV SOLN
500.0000 mL | INTRAVENOUS | Status: DC | PRN
  Administered 2019-06-17: 1000 mL via INTRAVENOUS
  Administered 2019-06-17: 500 mL via INTRAVENOUS

## 2019-06-17 MED ORDER — LIDOCAINE HCL (PF) 1 % IJ SOLN
INTRAMUSCULAR | Status: DC | PRN
  Administered 2019-06-17 (×2): 4 mL via EPIDURAL

## 2019-06-17 MED ORDER — OXYCODONE-ACETAMINOPHEN 5-325 MG PO TABS: 1.0000 | ORAL_TABLET | ORAL | Status: DC | PRN

## 2019-06-17 MED ORDER — OXYTOCIN 40 UNITS IN NORMAL SALINE INFUSION - SIMPLE MED
2.5000 [IU]/h | INTRAVENOUS | Status: DC
  Administered 2019-06-18: 2.5 [IU]/h via INTRAVENOUS
  Filled 2019-06-17: qty 1000

## 2019-06-17 MED ORDER — ONDANSETRON HCL 4 MG/2ML IJ SOLN: 4.0000 mg | Freq: Four times a day (QID) | INTRAMUSCULAR | Status: DC | PRN

## 2019-06-17 MED ORDER — FENTANYL-BUPIVACAINE-NACL 0.5-0.125-0.9 MG/250ML-% EP SOLN
12.0000 mL/h | EPIDURAL | Status: DC | PRN
  Filled 2019-06-17: qty 250

## 2019-06-17 MED ORDER — LIDOCAINE HCL (PF) 1 % IJ SOLN: 30.0000 mL | INTRAMUSCULAR | Status: DC | PRN

## 2019-06-17 MED ORDER — LACTATED RINGERS IV SOLN: 500.0000 mL | Freq: Once | INTRAVENOUS | Status: DC

## 2019-06-17 MED ORDER — SODIUM CHLORIDE (PF) 0.9 % IJ SOLN
INTRAMUSCULAR | Status: DC | PRN
  Administered 2019-06-17: 12 mL/h via EPIDURAL

## 2019-06-17 MED ORDER — LACTATED RINGERS AMNIOINFUSION
INTRAVENOUS | Status: DC
  Administered 2019-06-17: 23:00:00 via INTRAUTERINE

## 2019-06-17 NOTE — Telephone Encounter (Signed)
No call attempt made to remind the patient of the upcoming visit and covid restrictions as she is hospitalized.

## 2019-06-17 NOTE — H&P (Signed)
OBSTETRIC ADMISSION HISTORY AND PHYSICAL  Kimberly Reid is a 28 y.o. female G2P1001 with IUP at [redacted]w[redacted]d presenting for contracting and SROM. She reports +FMs. No LOF, VB, blurry vision, headaches, peripheral edema, or RUQ pain. She plans on breastfeeding. She requests OCPs for birth control.  Dating: By LMP, confirmed by first trimester [place] --->  Estimated Date of Delivery: 06/18/19  Sono:   @[redacted]w[redacted]d , CWD, normal anatomy, cephalic presentation, normal growth Fetal Echo Normal  Prenatal History/Complications: History gDM with previous pregnancy, negative this pregnancy  Past Medical History: Past Medical History:  Diagnosis Date  . 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 Gestational diabetes    with first preg  . Heart murmur   . PCOS (polycystic ovarian syndrome)   . Polycystic ovary syndrome 03/24/2013    Past Surgical History: Past Surgical History:  Procedure Laterality Date  . CARDIAC SURGERY    . COARCTATION OF AORTA REPAIR      Obstetrical History: OB History    Gravida  2   Para  1   Term  1   Preterm  0   AB  0   Living  1     SAB  0   TAB  0   Ectopic  0   Multiple  0   Live Births  1           Social History: Social History   Socioeconomic History  . Marital status: Married    Spouse name: Not on file  . Number of children: 1  . Years of education: Not on file  . Highest education level: Not on file  Occupational History  . Not on file  Social Needs  . Financial resource strain: Not on file  . Food insecurity    Worry: Not on file    Inability: Not on file  . Transportation needs    Medical: Not on file    Non-medical: Not on file  Tobacco Use  . Smoking status: Never Smoker  . Smokeless tobacco: Never Used  Substance and Sexual Activity  . Alcohol use: Not Currently    Frequency: Never  . Drug use: No  . Sexual activity: Yes    Birth control/protection: None  Lifestyle  . Physical activity    Days per  week: Not on file    Minutes per session: Not on file  . Stress: Not on file  Relationships  . Social Herbalist on phone: Not on file    Gets together: Not on file    Attends religious service: Not on file    Active member of club or organization: Not on file    Attends meetings of clubs or organizations: Not on file    Relationship status: Not on file  Other Topics Concern  . Not on file  Social History Narrative  . Not on file    Family History: Family History  Problem Relation Age of Onset  . Diabetes Mother   . Healthy Father   . Congenital Murmur Sister     Allergies: No Known Allergies  Medications Prior to Admission  Medication Sig Dispense Refill Last Dose  . hydrOXYzine (VISTARIL) 25 MG capsule Take 1-2 capsules (25-50 mg total) by mouth at bedtime as needed for anxiety. 30 capsule 0   . Prenatal Vit-Fe Fumarate-FA (MULTIVITAMIN-PRENATAL) 27-0.8 MG TABS tablet Take 1 tablet by mouth daily at 12 noon.  Review of Systems   All systems reviewed and negative except as stated in HPI  Blood pressure 132/67, pulse 73, temperature 98.1 F (36.7 C), temperature source Oral, resp. rate 18, height 5\' 3"  (1.6 m), weight 100.7 kg, last menstrual period 09/11/2018, SpO2 97 %, currently breastfeeding. General appearance: alert, cooperative and uncomfortable with contractions Lungs: regular rate and effort Heart: regular rate  Abdomen: soft, non-tender Extremities: Homans sign is negative, no sign of DVT Presentation: cephalic Fetal monitoringBaseline: 145 bpm, Variability: Good {> 6 bpm), Accelerations: present and Decelerations: Absent Uterine activity: contractions q2-3 min Dilation: 3 Effacement (%): 50 Station: -3 Exam by:: Kimberly Reid(Kimberly Reid)   Prenatal labs: ABO, Rh: --/--/O POS, O POS Performed at [facility], [address] [place], [place] [address]  [phone](08/20 1605) Antibody: NEG (08/20 1605) Rubella: 5.48 (01/16 1400) RPR: Non Reactive  (06/01 0839)  HBsAg: Negative (01/16 1400)  HIV: Non Reactive (06/01 0839)  GBS: Negative (07/30 0000)  2 hr GTT 112 (early third trimester)  Prenatal Transfer Tool  Maternal Diabetes: No Genetic Screening: Normal Maternal Ultrasounds/Referrals: Normal Fetal Ultrasounds or other Referrals:  Fetal echo normal Maternal Substance Abuse:  No Significant Maternal Medications:  None Significant Maternal Lab Results: None  Results for orders placed or performed during the hospital encounter of 06/17/19 (from the past 24 hour(s))  Type and screen [facility]   Collection Time: 06/17/19  4:05 PM  Result Value Ref Range   ABO/RH(D) O POS    Antibody Screen NEG    Sample Expiration      06/20/2019,2359 Performed at [facility], [address] [place], [place] [address]   ABO/Rh   Collection Time: 06/17/19  4:05 PM  Result Value Ref Range   ABO/RH(D)      O POS Performed at [facility], [address] [place], [place] [address]   CBC   Collection Time: 06/17/19  4:14 PM  Result Value Ref Range   WBC 8.9 4.0 - 10.5 K/uL   RBC 4.40 3.87 - 5.11 MIL/uL   Hemoglobin 12.7 12.0 - 15.0 g/dL   HCT 82.937.8 56.236.0 - 13.046.0 %   MCV 85.9 80.0 - 100.0 fL   MCH 28.9 26.0 - 34.0 pg   MCHC 33.6 30.0 - 36.0 g/dL   RDW 86.513.5 78.411.5 - 69.615.5 %   Platelets 205 150 - 400 K/uL   nRBC 0.0 0.0 - 0.2 %    Patient Active Problem List   Diagnosis Date Noted  . Normal labor 06/17/2019  . COVID-19 virus infection 05/20/2019  . Language barrier 03/29/2019  . History of gestational diabetes in prior pregnancy, currently pregnant 02/15/2019  . Supervision of high risk pregnancy, antepartum 11/19/2018  . PCOS (polycystic ovarian syndrome) 05/26/2013  . 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 History of repair of coarctation of aorta 05/26/2013    Assessment: Kimberly Reid is a 28 y.o. G2P1001 at 3843w6d here for spontaneous onset of labor  1. Labor: can consider Pitocin for  augmentation as needed 2. FWB: Fetal status reassuring. Continue monitoring 3. Pain: Desires an epidural for coping 4. GBS: negative   Plan: Continue with time-appropriate cervical exams Anticipate NSVD  C/S as appropriate  Cartina Brousseau L Aydin Hink, DO  06/17/2019, 5:33 PM

## 2019-06-17 NOTE — Progress Notes (Signed)
LABOR PROGRESS NOTE  Kimberly Reid is a 28 y.o. G2P1001 at [redacted]w[redacted]d  admitted for SOL and SROM.   Subjective: Epidural working well, starting to feel some pressure in the last few minutes  Objective: BP (!) 115/53   Pulse 65   Temp 98.8 F (37.1 C) (Oral)   Resp 18   Ht 5\' 3"  (1.6 m)   Wt 100.7 kg   LMP 09/11/2018   SpO2 100%   BMI 39.33 kg/m  or  Vitals:   06/17/19 2000 06/17/19 2030 06/17/19 2100 06/17/19 2130  BP: 105/87 121/73 127/66 (!) 115/53  Pulse: 63 83 71 65  Resp: 18  18 18   Temp:  98.8 F (37.1 C)    TempSrc:  Oral    SpO2:      Weight:      Height:         Dilation: 7.5 Effacement (%): 80 Cervical Position: Middle Station: -1 Presentation: Vertex Exam by:: Dr. Dione Plover FHT: baseline rate 135, moderate varibility, +acel, repetitive variable decel Toco: ctx q95m  Labs: Lab Results  Component Value Date   WBC 8.9 06/17/2019   HGB 12.7 06/17/2019   HCT 37.8 06/17/2019   MCV 85.9 06/17/2019   PLT 205 06/17/2019    Patient Active Problem List   Diagnosis Date Noted  . Normal labor 06/17/2019  . COVID-19 virus infection 05/20/2019  . Language barrier 03/29/2019  . History of gestational diabetes in prior pregnancy, currently pregnant 02/15/2019  . Supervision of high risk pregnancy, antepartum 11/19/2018  . PCOS (polycystic ovarian syndrome) 05/26/2013  . 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 History of repair of coarctation of aorta 05/26/2013    Assessment / Plan: 28 y.o. G2P1001 at [redacted]w[redacted]d here for SOL and SROM.  Labor: IUPC placed for repetitive variables at 2130, start amnioinfusion, progressing well with good contraction pattern, pitocin augmentation PRN Fetal Wellbeing:  Cat II for variables but overall reassuring with normal baseline and moderate variability maintained as well as occasional accel Pain Control:  Epidural working well Anticipated MOD:  NSVD  Hx coarctation of aorta with repair: prior NSVD w/o complications  Clarnce Flock MD/[facility] OB Fellow  06/17/2019, 10:17 PM

## 2019-06-17 NOTE — Anesthesia Procedure Notes (Signed)
Epidural Patient location during procedure: OB Start time: 06/17/2019 5:45 PM End time: 06/17/2019 5:53 PM  Staffing Anesthesiologist: Janeece Riggers, MD Performed: anesthesiologist   Preanesthetic Checklist Completed: patient identified, site marked, surgical consent, pre-op evaluation, timeout performed, IV checked, risks and benefits discussed and monitors and equipment checked  Epidural Patient position: sitting Prep: site prepped and draped and DuraPrep Patient monitoring: continuous pulse ox and blood pressure Approach: midline Location: L3-L4 Injection technique: LOR air  Needle:  Needle type: Tuohy  Needle gauge: 17 G Needle length: 9 cm and 9 Needle insertion depth: 5 cm cm Catheter type: closed end flexible Catheter size: 19 Gauge Catheter at skin depth: 10 cm Test dose: negative and Other  Assessment Events: blood not aspirated, injection not painful, no injection resistance, negative IV test and no paresthesia  Additional Notes Patient identified. Risks and benefits discussed including failed block, incomplete  Pain control, post dural puncture headache, nerve damage, paralysis, blood pressure Changes, nausea, vomiting, reactions to medications-both toxic and allergic and post Partum back pain. All questions were answered. Patient expressed understanding and wished to proceed. Sterile technique was used throughout procedure. Epidural site was Dressed with sterile barrier dressing. No paresthesias, signs of intravascular injection Or signs of intrathecal spread were encountered.  Patient was more comfortable after the epidural was dosed. Please see RN's note for documentation of vital signs and FHR which are stable.

## 2019-06-17 NOTE — Anesthesia Preprocedure Evaluation (Signed)
Anesthesia Evaluation    Airway Mallampati: II  TM Distance: >3 FB Neck ROM: Full    Dental no notable dental hx. 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   Neuro/Psych negative neurological ROS  negative psych ROS   GI/Hepatic Neg liver ROS, GERD  ,  Endo/Other  diabetes, GestationalMorbid obesity  Renal/GU negative Renal ROS  negative genitourinary   Musculoskeletal negative musculoskeletal ROS (+)   Abdominal (+) + obese,   Peds  Hematology negative hematology ROS (+)   Anesthesia Other Findings   Reproductive/Obstetrics (+) Pregnancy                             Anesthesia Physical Anesthesia Plan  ASA: III  Anesthesia Plan: Epidural   Post-op Pain Management:    Induction:   PONV Risk Score and Plan:   Airway Management Planned: Natural Airway  Additional Equipment:   Intra-op Plan:   Post-operative Plan:   Informed Consent: I have reviewed the patients History and Physical, chart, labs and discussed the procedure including the risks, benefits and alternatives for the proposed anesthesia with the patient or authorized representative who has indicated his/her understanding and acceptance.       Plan Discussed with: Anesthesiologist  Anesthesia Plan Comments:         Anesthesia Quick Evaluation

## 2019-06-18 ENCOUNTER — Inpatient Hospital Stay (HOSPITAL_COMMUNITY): Payer: Medicaid Other

## 2019-06-18 ENCOUNTER — Other Ambulatory Visit: Payer: Self-pay

## 2019-06-18 ENCOUNTER — Encounter: Payer: Self-pay | Admitting: Medical

## 2019-06-18 ENCOUNTER — Encounter (HOSPITAL_COMMUNITY): Payer: Self-pay

## 2019-06-18 LAB — RPR: RPR Ser Ql: NONREACTIVE

## 2019-06-18 MED ORDER — ZOLPIDEM TARTRATE 5 MG PO TABS: 5.0000 mg | ORAL_TABLET | Freq: Every evening | ORAL | Status: DC | PRN

## 2019-06-18 MED ORDER — BENZOCAINE-MENTHOL 20-0.5 % EX AERO: 1.0000 "application " | INHALATION_SPRAY | CUTANEOUS | Status: DC | PRN

## 2019-06-18 MED ORDER — ONDANSETRON HCL 4 MG/2ML IJ SOLN: 4.0000 mg | INTRAMUSCULAR | Status: DC | PRN

## 2019-06-18 MED ORDER — PRENATAL MULTIVITAMIN CH
1.0000 | ORAL_TABLET | Freq: Every day | ORAL | Status: DC
  Administered 2019-06-18 – 2019-06-20 (×3): 1 via ORAL
  Filled 2019-06-18 (×3): qty 1

## 2019-06-18 MED ORDER — METHYLERGONOVINE MALEATE 0.2 MG/ML IJ SOLN: 0.2000 mg | INTRAMUSCULAR | Status: DC | PRN

## 2019-06-18 MED ORDER — FERROUS SULFATE 325 (65 FE) MG PO TABS
325.0000 mg | ORAL_TABLET | Freq: Two times a day (BID) | ORAL | Status: DC
  Administered 2019-06-18 – 2019-06-20 (×4): 325 mg via ORAL
  Filled 2019-06-18 (×4): qty 1

## 2019-06-18 MED ORDER — OXYTOCIN 40 UNITS IN NORMAL SALINE INFUSION - SIMPLE MED
1.0000 m[IU]/min | INTRAVENOUS | Status: DC
  Administered 2019-06-18 (×2): 2 m[IU]/min via INTRAVENOUS

## 2019-06-18 MED ORDER — MEASLES, MUMPS & RUBELLA VAC IJ SOLR: 0.5000 mL | Freq: Once | INTRAMUSCULAR | Status: DC

## 2019-06-18 MED ORDER — TETANUS-DIPHTH-ACELL PERTUSSIS 5-2.5-18.5 LF-MCG/0.5 IM SUSP: 0.5000 mL | Freq: Once | INTRAMUSCULAR | Status: DC

## 2019-06-18 MED ORDER — ONDANSETRON HCL 4 MG PO TABS: 4.0000 mg | ORAL_TABLET | ORAL | Status: DC | PRN

## 2019-06-18 MED ORDER — TERBUTALINE SULFATE 1 MG/ML IJ SOLN: 0.2500 mg | Freq: Once | INTRAMUSCULAR | Status: DC | PRN

## 2019-06-18 MED ORDER — IBUPROFEN 600 MG PO TABS
600.0000 mg | ORAL_TABLET | Freq: Four times a day (QID) | ORAL | Status: DC
  Administered 2019-06-18 – 2019-06-20 (×9): 600 mg via ORAL
  Filled 2019-06-18 (×9): qty 1

## 2019-06-18 MED ORDER — WITCH HAZEL-GLYCERIN EX PADS: 1.0000 "application " | MEDICATED_PAD | CUTANEOUS | Status: DC | PRN

## 2019-06-18 MED ORDER — SIMETHICONE 80 MG PO CHEW: 80.0000 mg | CHEWABLE_TABLET | ORAL | Status: DC | PRN

## 2019-06-18 MED ORDER — METHYLERGONOVINE MALEATE 0.2 MG PO TABS: 0.2000 mg | ORAL_TABLET | ORAL | Status: DC | PRN

## 2019-06-18 MED ORDER — COCONUT OIL OIL: 1.0000 "application " | TOPICAL_OIL | Status: DC | PRN

## 2019-06-18 MED ORDER — DIPHENHYDRAMINE HCL 25 MG PO CAPS: 25.0000 mg | ORAL_CAPSULE | Freq: Four times a day (QID) | ORAL | Status: DC | PRN

## 2019-06-18 MED ORDER — BISACODYL 10 MG RE SUPP: 10.0000 mg | Freq: Every day | RECTAL | Status: DC | PRN

## 2019-06-18 MED ORDER — FLEET ENEMA 7-19 GM/118ML RE ENEM: 1.0000 | ENEMA | Freq: Every day | RECTAL | Status: DC | PRN

## 2019-06-18 MED ORDER — DIBUCAINE (PERIANAL) 1 % EX OINT: 1.0000 "application " | TOPICAL_OINTMENT | CUTANEOUS | Status: DC | PRN

## 2019-06-18 MED ORDER — DOCUSATE SODIUM 100 MG PO CAPS
100.0000 mg | ORAL_CAPSULE | Freq: Two times a day (BID) | ORAL | Status: DC
  Administered 2019-06-18 – 2019-06-20 (×4): 100 mg via ORAL
  Filled 2019-06-18 (×4): qty 1

## 2019-06-18 MED ORDER — ACETAMINOPHEN 325 MG PO TABS: 650.0000 mg | ORAL_TABLET | ORAL | Status: DC | PRN

## 2019-06-18 NOTE — Discharge Summary (Signed)
Postpartum Discharge Summary     Patient Name: Kimberly Reid DOB: 1991-04-16 MRN: [id number]  Date of admission: 06/17/2019 Delivering Provider: Clarnce Flock   Date of discharge: 06/19/2019  Admitting diagnosis: CTX 5 MIN Intrauterine pregnancy: [redacted]w[redacted]d     Secondary diagnosis:  Active Problems:   Normal labor  Additional problems: + COVID 7/15 (asymptomatic)     Discharge diagnosis: Term Pregnancy Delivered                                                                                                Post partum procedures:None  Augmentation: AROM, Pitocin, Cytotec and Foley Balloon  Complications: None  Hospital course:  Onset of Labor With Vaginal Delivery     28 y.o. yo G2P1001 at [redacted]w[redacted]d was admitted in Active Labor on 06/17/2019. Patient had an uncomplicated labor course as follows. Membrane Rupture Time/Date: 3:36 PM ,06/17/2019   Intrapartum Procedures: Episiotomy: None [1]                                         Lacerations:  None [1]  Patient had a delivery of a Viable infant. 06/18/2019  Information for the patient's newborn:  Suriah, Peragine [[id number]]  Delivery Method: Vaginal, Spontaneous(Filed from Delivery Summary)     Pateint had an uncomplicated postpartum course.  She is ambulating, tolerating a regular diet, passing flatus, and urinating well. Patient with chest pain on 8/21 for which EKG and CXR were done due to patient's history; both WNL. Patient is discharged home in stable condition on 06/19/19.    Magnesium Sulfate recieved: No BMZ received: No  Physical exam  Vitals:   06/18/19 1515 06/18/19 1811 06/18/19 2014 06/19/19 0500  BP: 119/69 126/76 125/62 (!) 112/53  Pulse: 72  71 60  Resp: (!) 24 20 16 16   Temp: 97.9 F (36.6 C) 98.3 F (36.8 C) 98 F (36.7 C) 98.2 F (36.8 C)  TempSrc: Oral Oral Oral Oral  SpO2: 99% 99% 98%   Weight:      Height:       General: alert, cooperative and no distress Lochia:  appropriate Uterine Fundus: firm Incision: N/A DVT Evaluation: No evidence of DVT seen on physical exam. Labs: Lab Results  Component Value Date   WBC 8.9 06/17/2019   HGB 12.7 06/17/2019   HCT 37.8 06/17/2019   MCV 85.9 06/17/2019   PLT 205 06/17/2019   CMP Latest Ref Rng & Units 01/26/2018  Glucose 65 - 99 mg/dL 81  BUN 6 - 20 mg/dL 10  Creatinine 0.57 - 1.00 mg/dL 0.54(L)  Sodium 134 - 144 mmol/L 139  Potassium 3.5 - 5.2 mmol/L 4.0  Chloride 96 - 106 mmol/L 99  CO2 20 - 29 mmol/L 23  Calcium 8.7 - 10.2 mg/dL 9.4  Total Protein 6.0 - 8.3 g/dL -  Total Bilirubin 0.2 - 1.2 mg/dL -  Alkaline Phos 39 - 117 U/L -  AST 0 - 37 U/L -  ALT 0 - 35  U/L -    Discharge instruction: per After Visit Summary and "Baby and Me Booklet".  After visit meds:  Allergies as of 06/19/2019   No Known Allergies     Medication List    TAKE these medications   hydrOXYzine 25 MG capsule Commonly known as: Vistaril Take 1-2 capsules (25-50 mg total) by mouth at bedtime as needed for anxiety.   multivitamin-prenatal 27-0.8 MG Tabs tablet Take 1 tablet by mouth daily at 12 noon.       Diet: routine diet  Activity: Advance as tolerated. Pelvic rest for 6 weeks.   Outpatient follow up:4 weeks Follow up Appt: No future appointments. Follow up Visit:   Please schedule this patient for Postpartum visit in: 4 weeks with the following provider: Any provider For C/S patients schedule nurse incision check in weeks 2 weeks: no Low risk pregnancy complicated by: COVID Delivery mode:  SVD Anticipated Birth Control:  POPs PP Procedures needed:   Schedule Integrated BH visit: no      Newborn Data: Live born female  Birth Weight:   APGAR: 9, 9  Newborn Delivery   Birth date/time: 06/18/2019 07:38:00 Delivery type: Vaginal, Spontaneous      Baby Feeding: Breast Disposition:home with mother   06/19/2019 Joselyn Arrowhelsea N Angelin Cutrone, MD

## 2019-06-18 NOTE — Progress Notes (Signed)
Vitals:   06/18/19 0130 06/18/19 0200  BP: (!) 115/57 (!) 116/47  Pulse: 78 70  Resp: 18 18  Temp:    SpO2:     cx 7-8 for 4+ hours now, swelling anteriorry 9a-3a.  Baby most likely OP.  Trying position changes.  MVUs adequate, but pattern still irregular. FHR Cat 1.  Will try augmenting with some Pitocin in hopes that a more regular pattern will have a benefit, keep positioning to facilitate rotation. PT comfortable w/epidural.

## 2019-06-18 NOTE — Lactation Note (Signed)
This note was copied from a baby's chart. Lactation Consultation Note  Patient Name: Kimberly Reid Date: 06/18/2019    [facility] called RN.  RN states mom has been breastfeeding infant.  Infant is latching.  No difficulties noted.   Infant 10 hours old.  [place] asked RN to call [facility] if assistance is needed with breastfeeding.     Maternal Data    Feeding Feeding Type: Breast Fed  LATCH Score                   Interventions    Lactation Tools Discussed/Used     Consult Status      Ferne Coe [facility] 06/18/2019, 6:42 PM

## 2019-06-18 NOTE — Anesthesia Postprocedure Evaluation (Signed)
Anesthesia Post Note  Patient: Kimberly Reid  Procedure(s) Performed: AN AD [place]     Patient location during evaluation: Mother Baby Anesthesia Type: Epidural Level of consciousness: awake and alert, oriented and patient cooperative Pain management: pain level controlled Vital Signs Assessment: post-procedure vital signs reviewed and stable Respiratory status: spontaneous breathing Cardiovascular status: stable Postop Assessment: no headache, epidural receding, patient able to bend at knees, no signs of nausea or vomiting and able to ambulate Anesthetic complications: no Comments: Pt. Interviewed via phone call d/t COVID 19 precautions and spanish interpreter utilized.  Pt. States she is walking and reports pain score of 0.     Last Vitals:  Vitals:   06/18/19 1111 06/18/19 1301  BP: 125/74   Pulse: (!) 59 66  Resp: 16 (!) 28  Temp: 36.8 C   SpO2: 100% 99%    Last Pain:  Vitals:   06/18/19 1254  TempSrc:   PainSc: 5    Pain Goal:                   [facility]

## 2019-06-18 NOTE — Progress Notes (Signed)
LABOR PROGRESS NOTE  Kimberly Reid is a 28 y.o. G2P1001 at [redacted]w[redacted]d  admitted for SOL and SROM.   Subjective: Having significant discomfort in fundus and in RLQ  Objective: BP (!) 103/40   Pulse 62   Temp 98.4 F (36.9 C) (Oral)   Resp 18   Ht 5\' 3"  (1.6 m)   Wt 100.7 kg   LMP 09/11/2018   SpO2 98%   BMI 39.33 kg/m  or  Vitals:   06/18/19 0230 06/18/19 0400 06/18/19 0430 06/18/19 0530  BP: (!) 113/46 (!) 105/42 (!) 103/40   Pulse: 75 63 62   Resp:      Temp: 99.1 F (37.3 C)   98.4 F (36.9 C)  TempSrc: Oral   Oral  SpO2:  98% 98%   Weight:      Height:         Dilation: 8.5 Effacement (%): (cervical swelling) Cervical Position: [place]: 0 Presentation: Vertex Exam by:: Janann August, RN FHT: baseline rate 130, moderate varibility, +acel, no decel Toco: ctx q2-27m with some coupling  Labs: Lab Results  Component Value Date   WBC 8.9 06/17/2019   HGB 12.7 06/17/2019   HCT 37.8 06/17/2019   MCV 85.9 06/17/2019   PLT 205 06/17/2019    Patient Active Problem List   Diagnosis Date Noted  . Normal labor 06/17/2019  . COVID-19 virus infection 05/20/2019  . Language barrier 03/29/2019  . History of gestational diabetes in prior pregnancy, currently pregnant 02/15/2019  . Supervision of high risk pregnancy, antepartum 11/19/2018  . PCOS (polycystic ovarian syndrome) 05/26/2013  . 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 History of repair of coarctation of aorta 05/26/2013    Assessment / Plan: 28 y.o. G2P1001 at [redacted]w[redacted]d here for SOL and SROM.  Labor: slow progress despite adequate contractions recorded w [facility]. Ultrasound done to ascertain fetal position, did not appear to be OP but more likely between ROA-ROT. Examined with significant anterior cervical lip that was manually reduced with immediate improvement in patient's pain and some descent of fetal head to 1-2+. Very minimal amount of cervix also felt at 5 o clock. Baby likely slowly rotating to ROA,  will allow to cont labor given reassuring fetal status and recheck in 30-60 min, plan to start pushing if complete at that time.  Fetal Wellbeing:  Cat I Pain Control:  Epidural Anticipated MOD:  NSVD  Hx coarctation of aorta with repair: prior NSVD w/o complications  Clarnce Flock MD/[facility] OB Fellow  06/18/2019, 6:23 AM

## 2019-06-18 NOTE — Progress Notes (Signed)
Interval Progress Note: - Went bedside to assess patient as RN reported patient was having chest pain with slight tachypnea to RR 24.   S: Patient reports that around 1300 she stood up to use the restroom and felt pain in her chest. Pain is located in sternum and non-radiating. It is worse with deep breaths. Rated at 5-6/10 and denies pain like this previously. Pain mostly relieved and then recurred when patient stood up again around 1500. Denies burning or pain in her back. Denies other complaints.  O:  Vitals:   06/18/19 0947 06/18/19 1111 06/18/19 1301 06/18/19 1515  BP: 116/68 125/74  119/69  Pulse: (!) 59 (!) 59 66 72  Resp: 16 16 (!) 28 (!) 24  Temp: 98.4 F (36.9 C) 98.2 F (36.8 C)  97.9 F (36.6 C)  TempSrc: Oral Oral  Oral  SpO2: 99% 100% 99% 99%  Weight:      Height:      General: NAD, Resting in bed, AAOx3 Heart: RRR, soft systolic murmur  Chest: Reproducible chest pain over lower sternal border  Lungs: CTAB Abd: Soft Ext: No LE edema  A/P:  28 yo G2 now P2 who delivered at 40.0 this morning at 0738. Patient with hx of coarctation of aorta repaired in first year of life. Denies problems since that time. Reports that she was seen by Cardiology during this pregnancy and was told everything was fine. She tested positive for COVID initially on 7/15 and was re-tested on admission with positive test on 8/20. Will order CXR and EKG. Low suspicion for aortic etiology due to stable vitals and reproducible nature.   Barrington Ellison, MD [facility] Family Medicine Fellow, [facility] for [employer], [place]

## 2019-06-18 NOTE — Progress Notes (Signed)
Ordered pt meals by phone By Juliann Mule Spanish Interpreter.

## 2019-06-19 NOTE — Lactation Note (Signed)
This note was copied from a baby's chart. Lactation Consultation Note  Patient Name: [place] AYTKZ'S Date: 06/19/2019 Reason for consult: Follow-up assessment;Other (Comment)(mom + Cov - asymtomatic) LC called mom in the room due to waiting for gowns to arrive from supplies.  Per mom the baby recently fed for 65 mins and lots of swallows heard.  Per mom feeding only on one breast .  LC explained to mom the baby is probably getting  Hungrier to offer both breast every feeding .  The baby has only lost 2 % weight loss and explained  That to mom.  Mom mentioned the baby has not had a wet diaper yet.  LC recommended saving all diapers so the nurse can see them . Also reminded mom to check the waste band for wets .  LC recommended so the baby gets more per feeding  If she is worried the baby isn't getting enough after breast massage , hand express, pre-pump with the hand pump to enhance flow. Per mom willing to do that.    Maternal Data    Feeding Feeding Type: (per mom last fed at 1:35 for 65 mins with swallows)  LATCH Score                   Interventions Interventions: Breast feeding basics reviewed  Lactation Tools Discussed/Used     Consult Status Consult Status: Follow-up Date: 06/20/19 Follow-up type: In-patient    [place] 06/19/2019, 3:20 PM

## 2019-06-20 DIAGNOSIS — Z3A40 40 weeks gestation of pregnancy: Secondary | ICD-10-CM

## 2019-06-20 MED ORDER — IBUPROFEN 600 MG PO TABS: 600.0000 mg | ORAL_TABLET | Freq: Four times a day (QID) | ORAL | 0 refills | Status: DC

## 2019-06-20 MED ORDER — ACETAMINOPHEN 325 MG PO TABS: 650.0000 mg | ORAL_TABLET | ORAL | 0 refills | Status: DC | PRN

## 2019-06-20 NOTE — Discharge Instructions (Signed)

## 2019-06-20 NOTE — Lactation Note (Signed)
This note was copied from a baby's chart. Lactation Consultation Note  Patient Name: Kimberly Reid Date: 06/20/2019 Reason for consult: Follow-up assessment;Term;Infant weight loss  Baby is 45 hours old  As LC entered the room baby in the crib awake and fussy.  LC offered to check diaper and assist with latch. Baby had a large wet and Latched easily on the right breast.  Mom needed review of arm positioning with the football  Position. Depth achieved , increased with compressions and increased swallows noted.  Per mom comfortable with latch.  LC recommended prior to feeding - breast massage, hand express, pre-pump if needed, and latch the baby with compressions until swallows and then intermittent.  LC also recommended she can apply warm moist heat to the breast just before feeding or during.  LC stressed the importance of STS feedings until the baby can stay awake for a feeding, back to birth weight and gaining steadily.  Mom mentioned she had engorgement issues with her baby , 1st baby never latched and she pumped.  Sore nipple and engorgement prevention and tx reviewed. LC showed mom how to use the hand pump from the DEBP kit and per mom plans to obtain a DEBP from [facility] .  Mom aware of the [facility] resources at [facility] after D/C .    Maternal Data Has patient been taught Hand Expression?: Yes(reviewed with mom) Does the patient have breastfeeding experience prior to this delivery?: Yes  Feeding Feeding Type: Breast Fed Nipple Type: Slow - flow  LATCH Score Latch: Grasps breast easily, tongue down, lips flanged, rhythmical sucking.  Audible Swallowing: Spontaneous and intermittent  Type of Nipple: Everted at rest and after stimulation  Comfort (Breast/Nipple): Filling, red/small blisters or bruises, mild/mod discomfort  Hold (Positioning): Assistance needed to correctly position infant at breast and maintain latch.  LATCH Score:  8  Interventions Interventions: Breast feeding basics reviewed;Assisted with latch;Skin to skin;Breast massage;Hand express;Reverse pressure;Breast compression;Adjust position;Support pillows;Position options  Lactation Tools Discussed/Used Tools: Pump Breast pump type: Double-Electric Breast Pump WIC Program: Yes(per mom mentioned she will be going to [facility] , has the DEBP kit) Pump Review: Milk Storage   Consult Status Consult Status: Complete Date: 06/20/19    Myer Haff 06/20/2019, 12:47 PM

## 2019-06-20 NOTE — Discharge Summary (Signed)
Update: patient discharged on 06/20/2019, stayed additional day to due feeding difficulties with infant. Doing well, no new or acute concerns, d/c to home 06/20/2019.     Postpartum Discharge Summary     Patient Name: Kimberly SpikesKaren Balderas Reid DOB: 04-01-1991 MRN: [id number]  Date of admission: 06/17/2019 Delivering Provider: Venora MaplesECKSTAT, Elsia Lasota M   Date of discharge: 06/20/2019  Admitting diagnosis: CTX 5 MIN Intrauterine pregnancy: 361w0d     Secondary diagnosis:  Active Problems:   Normal labor  Additional problems: + COVID 7/15 (asymptomatic)     Discharge diagnosis: Term Pregnancy Delivered                                                                                                Post partum procedures:None  Augmentation: AROM, Pitocin, Cytotec and Foley Balloon  Complications: None  Hospital course:  Onset of Labor With Vaginal Delivery     28 y.o. yo G2P1001 at 5461w0d was admitted in Active Labor on 06/17/2019. Patient had an uncomplicated labor course as follows. Membrane Rupture Time/Date: 3:36 PM ,06/17/2019   Intrapartum Procedures: Episiotomy: None [1]                                         Lacerations:  None [1]  Patient had a delivery of a Viable infant. 06/18/2019  Information for the patient's newborn:  Pearline CablesBalderas Reid, Boy Holland [[id number]][[id number]]  Delivery Method: Vaginal, Spontaneous(Filed from Delivery Summary)     Pateint had an uncomplicated postpartum course.  She is ambulating, tolerating a regular diet, passing flatus, and urinating well. Patient with chest pain on 8/21 for which EKG and CXR were done due to patient's history; both WNL. Patient is discharged home in stable condition on 06/20/19.    Magnesium Sulfate recieved: No BMZ received: No  Physical exam  Vitals:   06/18/19 2014 06/19/19 0500 06/19/19 1700 06/20/19 0622  BP: 125/62 (!) 112/53 119/68   Pulse: 71 60 70   Resp: 16 16 18 17   Temp: 98 F (36.7 C) 98.2 F (36.8 C) 97.9 F (36.6 C) 98.1 F  (36.7 C)  TempSrc: Oral Oral Oral Oral  SpO2: 98%     Weight:      Height:       General: alert, cooperative and no distress Lochia: appropriate Uterine Fundus: firm Incision: N/A DVT Evaluation: No evidence of DVT seen on physical exam. Labs: Lab Results  Component Value Date   WBC 8.9 06/17/2019   HGB 12.7 06/17/2019   HCT 37.8 06/17/2019   MCV 85.9 06/17/2019   PLT 205 06/17/2019   CMP Latest Ref Rng & Units 01/26/2018  Glucose 65 - 99 mg/dL 81  BUN 6 - 20 mg/dL 10  Creatinine 7.820.57 - 9.561.00 mg/dL 2.13(Y0.54(L)  Sodium 865134 - 784144 mmol/L 139  Potassium 3.5 - 5.2 mmol/L 4.0  Chloride 96 - 106 mmol/L 99  CO2 20 - 29 mmol/L 23  Calcium 8.7 - 10.2 mg/dL 9.4  Total Protein 6.0 - 8.3 g/dL -  Total  Bilirubin 0.2 - 1.2 mg/dL -  Alkaline Phos 39 - 117 U/L -  AST 0 - 37 U/L -  ALT 0 - 35 U/L -    Discharge instruction: per After Visit Summary and "Baby and Me Booklet".  After visit meds:  Allergies as of 06/20/2019   No Known Allergies     Medication List    TAKE these medications   acetaminophen 325 MG tablet Commonly known as: Tylenol Take 2 tablets (650 mg total) by mouth every 4 (four) hours as needed (for pain scale < 4).   hydrOXYzine 25 MG capsule Commonly known as: Vistaril Take 1-2 capsules (25-50 mg total) by mouth at bedtime as needed for anxiety.   ibuprofen 600 MG tablet Commonly known as: ADVIL Take 1 tablet (600 mg total) by mouth every 6 (six) hours.   multivitamin-prenatal 27-0.8 MG Tabs tablet Take 1 tablet by mouth daily at 12 noon.       Diet: routine diet  Activity: Advance as tolerated. Pelvic rest for 6 weeks.   Outpatient follow up:4 weeks Follow up Appt: No future appointments. Follow up Visit:   Please schedule this patient for Postpartum visit in: 4 weeks with the following provider: Any provider For C/S patients schedule nurse incision check in weeks 2 weeks: no Low risk pregnancy complicated by: COVID Delivery mode:   SVD Anticipated Birth Control:  POPs PP Procedures needed:   Schedule Integrated BH visit: no      Newborn Data: Live born female  Birth Weight:   APGAR: 55, 9  Newborn Delivery   Birth date/time: 06/18/2019 07:38:00 Delivery type: Vaginal, Spontaneous      Baby Feeding: Breast Disposition:home with mother   06/20/2019 Clarnce Flock, MD

## 2019-06-21 MED FILL — IBUPROFEN 600 MG TABLET: 600 | 7 days supply | Qty: 30 | Fill #0

## 2019-07-26 ENCOUNTER — Telehealth: Payer: Self-pay | Admitting: Medical

## 2019-07-26 NOTE — Telephone Encounter (Signed)
Spanish interpreter Gypsy Balsam called patient about her appointment on 9/29 @ 10:15.Patient stated that she cannot do a morning appointment she needs something after 12. Patient was rescheduled to 3:55 on 9/29. Patient instructed that the appointment is a virtual appointment. Patient verbalized she has the [employer].

## 2019-07-27 ENCOUNTER — Ambulatory Visit: Payer: Self-pay | Admitting: Medical

## 2019-07-27 ENCOUNTER — Other Ambulatory Visit: Payer: Self-pay

## 2019-07-27 ENCOUNTER — Ambulatory Visit (INDEPENDENT_AMBULATORY_CARE_PROVIDER_SITE_OTHER): Payer: Self-pay | Admitting: Student

## 2019-07-27 DIAGNOSIS — Z1389 Encounter for screening for other disorder: Secondary | ICD-10-CM

## 2019-07-27 DIAGNOSIS — O0990 Supervision of high risk pregnancy, unspecified, unspecified trimester: Secondary | ICD-10-CM

## 2019-07-27 MED ORDER — NORETHIN ACE-ETH ESTRAD-FE 1-20 MG-MCG(24) PO TABS: 1.0000 | ORAL_TABLET | Freq: Every day | ORAL | 11 refills | Status: DC

## 2019-07-27 NOTE — Progress Notes (Signed)
I connected with@ on 07/27/19 at  3:55 PM EDT by: WebEx and verified that I am speaking with the correct person using two identifiers.  Patient is located at home and provider is located at [place].   The purpose of this virtual visit is to provide medical care while limiting exposure to the novel coronavirus. I discussed the limitations, risks, security and privacy concerns of performing an evaluation and management service by Webex and the availability of in person appointments. I also discussed with the patient that there may be a patient responsible charge related to this service. By engaging in this virtual visit, you consent to the provision of healthcare.  Additionally, you authorize for your insurance to be billed for the services provided during this visit.  The patient expressed understanding and agreed to proceed.  The following staff members participated in the virtual visit:  Lowanda Foster  Post Partum Visit Note Subjective:    Ms. Celisa Schoenberg is a 28 y.o. [phone] female who presents for a postpartum visit. She is 6 weeks postpartum following a spontaneous vaginal delivery. I have fully reviewed the prenatal and intrapartum course. The delivery was at 40 gestational weeks. Outcome: spontaneous vaginal delivery. Anesthesia: epidural. Postpartum course has been uneventful. Baby's course has been uneventful Baby is feeding by breast. Bleeding no bleeding. Bowel function is normal. Bladder function is normal. Patient is not sexually active. Contraception method is OCP (estrogen/progesterone). Postpartum depression screening: negative.  The following portions of the patient's history were reviewed and updated as appropriate: allergies, current medications, past family history, past medical history, past social history, past surgical history and problem list.  Review of Systems Pertinent items are noted in HPI.   Objective:  There were no vitals filed for this visit. Self-Obtained        Assessment:   Healthy postpartum exam. Pap smear not done at today's visit. Last pap smear 10/2018 and results were normal. Next pap due 2023  Plan:    1. Contraception: OCP (estrogen/progesterone) 2. She understands that it is important to take every day at the same time; she has taken before and feels confident using them.  3. Follow up in PRN  15 minutes of -face-to-face time spent with the patient   [place], [place] 07/27/2019 4:43 PM

## 2019-09-28 ENCOUNTER — Other Ambulatory Visit: Payer: Self-pay

## 2019-09-28 ENCOUNTER — Ambulatory Visit: Payer: Self-pay | Attending: Nurse Practitioner | Admitting: Nurse Practitioner

## 2019-11-29 ENCOUNTER — Ambulatory Visit (HOSPITAL_COMMUNITY)
Admission: EM | Admit: 2019-11-29 | Discharge: 2019-11-29 | Disposition: A | Discharge status: Home / Self Care | Payer: Self-pay | Attending: Family Medicine | Admitting: Family Medicine

## 2019-11-29 ENCOUNTER — Other Ambulatory Visit: Payer: Self-pay

## 2019-11-29 ENCOUNTER — Encounter (HOSPITAL_COMMUNITY): Payer: Self-pay

## 2019-11-29 DIAGNOSIS — R1010 Upper abdominal pain, unspecified: Secondary | ICD-10-CM | POA: Insufficient documentation

## 2019-11-29 LAB — CBC
HCT: 42.6 % (ref 36.0–46.0)
Hemoglobin: 14.2 g/dL (ref 12.0–15.0)
MCH: 28.8 pg (ref 26.0–34.0)
MCHC: 33.3 g/dL (ref 30.0–36.0)
MCV: 86.4 fL (ref 80.0–100.0)
Platelets: 249 10*3/uL (ref 150–400)
RBC: 4.93 MIL/uL (ref 3.87–5.11)
RDW: 12.2 % (ref 11.5–15.5)
WBC: 7.2 10*3/uL (ref 4.0–10.5)
nRBC: 0 % (ref 0.0–0.2)

## 2019-11-29 LAB — COMPREHENSIVE METABOLIC PANEL
ALT: 30 U/L (ref 0–44)
AST: 21 U/L (ref 15–41)
Albumin: 4.5 g/dL (ref 3.5–5.0)
Alkaline Phosphatase: 71 U/L (ref 38–126)
Anion gap: 12 (ref 5–15)
BUN: 17 mg/dL (ref 6–20)
CO2: 22 mmol/L (ref 22–32)
Calcium: 10.2 mg/dL (ref 8.9–10.3)
Chloride: 101 mmol/L (ref 98–111)
Creatinine, Ser: 0.66 mg/dL (ref 0.44–1.00)
GFR calc Af Amer: >60 mL/min (ref >60)
GFR calc non Af Amer: >60 mL/min (ref >60)
Glucose, Bld: 129 mg/dL — ABNORMAL HIGH (ref 70–99)
Potassium: 3.7 mmol/L (ref 3.5–5.1)
Sodium: 135 mmol/L (ref 135–145)
Total Bilirubin: 1.1 mg/dL (ref 0.3–1.2)
Total Protein: 7.7 g/dL (ref 6.5–8.1)

## 2019-11-29 LAB — AMYLASE: Amylase: 49 U/L (ref 28–100)

## 2019-11-29 LAB — LIPASE, BLOOD: Lipase: 20 U/L (ref 11–51)

## 2019-11-29 MED ORDER — ONDANSETRON HCL 4 MG PO TABS: 4.0000 mg | ORAL_TABLET | Freq: Four times a day (QID) | ORAL | 0 refills | Status: DC

## 2019-11-29 NOTE — Discharge Instructions (Addendum)
Intenta mantenerte bien hidratado Prueba el zofran Llamaremos con los resultados Acuda al departamento de emergencias si sus sntomas empeoran.

## 2019-11-29 NOTE — ED Provider Notes (Signed)
[facility]    CSN: [id number] Arrival date & time: 11/29/19  1703      History   Chief Complaint Chief Complaint  Patient presents with  . Abdominal Pain    HPI Kimberly Reid is a 29 y.o. female.   She is presenting with upper abdominal pain.  This started earlier today.  She is also had associated diarrhea.  It is been nonbloody.  This happened 3 bouts today.  Denies any fever.  Has not tried anything for her symptoms.  No one else at home with similar symptoms.  Denies any travel.  No new or different medications.  No history of abdominal surgery.  Has been trying a keto diet over the last few days.  Appointment was conducted in Spanish with a phone interpreter  HPI  Past Medical History:  Diagnosis Date  . 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 Gestational diabetes    with first preg  . Heart murmur   . PCOS (polycystic ovarian syndrome)   . Polycystic ovary syndrome 03/24/2013    Patient Active Problem List   Diagnosis Date Noted  . Normal labor 06/17/2019  . COVID-19 virus infection 05/20/2019  . Language barrier 03/29/2019  . History of gestational diabetes in prior pregnancy, currently pregnant 02/15/2019  . Supervision of high risk pregnancy, antepartum 11/19/2018  . PCOS (polycystic ovarian syndrome) 05/26/2013  . 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 CARDIAC SURGERY    . COARCTATION OF AORTA REPAIR      OB History    Gravida  2   Para  2   Term  2   Preterm  0   AB  0   Living  2     SAB  0   TAB  0   Ectopic  0   Multiple  0   Live Births  2            Home Medications    Prior to Admission medications   Medication Sig Start Date End Date Taking? Authorizing Provider  acetaminophen (TYLENOL) 325 MG tablet Take 2 tablets (650 mg total) by mouth every 4 (four) hours as needed (for pain  scale < 4). 06/20/19   Venora Maples, MD  hydrOXYzine (VISTARIL) 25 MG capsule Take 1-2 capsules (25-50 mg total) by mouth at bedtime as needed for anxiety. 06/03/19   Rasch, Victorino Dike I, NP  ibuprofen (ADVIL) 600 MG tablet Take 1 tablet (600 mg total) by mouth every 6 (six) hours. 06/20/19   Venora Maples, MD  Norethindrone Acetate-Ethinyl Estrad-FE (LOESTRIN 24 FE) 1-20 MG-MCG(24) tablet Take 1 tablet by mouth daily. 07/27/19   Marylene Land, CNM  ondansetron (ZOFRAN) 4 MG tablet Take 1 tablet (4 mg total) by mouth every 6 (six) hours. 11/29/19   Myra Rude, MD  Prenatal Vit-Fe Fumarate-FA (MULTIVITAMIN-PRENATAL) 27-0.8 MG TABS tablet Take 1 tablet by mouth daily at 12 noon.    [provider]    Family History Family History  Problem Relation Age of Onset  . Diabetes Mother   . Healthy Father   . Congenital Murmur Sister     Social History Social History   Tobacco Use  . Smoking status: Never Smoker  . Smokeless tobacco: Never Used  Substance Use Topics  . Alcohol use: Not Currently  . Drug  use: No     Allergies   Patient has no known allergies.   Review of Systems Review of Systems See hpi   Physical Exam Triage Vital Signs ED Triage Vitals  Enc Vitals Group     BP 11/29/19 1740 131/72     Pulse Rate 11/29/19 1740 66     Resp 11/29/19 1740 16     Temp 11/29/19 1740 98.2 F (36.8 C)     Temp Source 11/29/19 1740 Oral     SpO2 11/29/19 1740 99 %     Weight --      Height --      Head Circumference --      Peak Flow --      Pain Score 11/29/19 1737 7     Pain Loc --      Pain Edu? --      Excl. in [place]? --    No data found.  Updated Vital Signs BP 131/72 (BP Location: Right Arm)   Pulse 66   Temp 98.2 F (36.8 C) (Oral)   Resp 16   SpO2 99%   Breastfeeding Yes   Visual Acuity Right Eye Distance:   Left Eye Distance:   Bilateral Distance:    Right Eye Near:   Left Eye Near:    Bilateral Near:     Physical Exam  Gen: NAD, alert, cooperative with exam,  CV:  no edema, +2 pedal pulses   Resp: no accessory muscle use, non-labored,  GI: Tenderness to palpation in the right upper quadrant, central quadrant and left upper quadrant, soft, positive bowel sounds, nondistended    UC Treatments / Results  Labs (all labs ordered are listed, but only abnormal results are displayed) Labs Reviewed  COMPREHENSIVE METABOLIC PANEL - Abnormal; Notable for the following components:      Result Value   Glucose, Bld 129 (*)    All other components within normal limits  CBC  LIPASE, BLOOD  AMYLASE    EKG   Radiology No results found.  Procedures Procedures (including critical care time)  Medications Ordered in UC Medications - No data to display  Initial Impression / Assessment and Plan / UC Course  I have reviewed the triage vital signs and the nursing notes.  Pertinent labs & imaging results that were available during my care of the patient were reviewed by me and considered in my medical decision making (see chart for details).     Ms. Ulice Bold is a 29 year old female is presenting with upper quadrant abdominal pain.  No significant changes of her vital signs.  Possible for pancreatitis, GERD or cholecystitis.  Having some associated diarrhea that is watery nonbloody in nature.  Symptoms have been present for today.  Obtain CBC, CMP and lipase and amylase.  Provided with Zofran.  Counseled on supportive care.  Given indications to follow-up and return.  Final Clinical Impressions(s) / UC Diagnoses   Final diagnoses:  Pain of upper abdomen     Discharge Instructions     Intenta mantenerte bien hidratado Prueba el zofran Llamaremos con los resultados Acuda al departamento de emergencias si sus sntomas empeoran.    ED Prescriptions    Medication Sig Dispense Auth. Provider   ondansetron (ZOFRAN) 4 MG tablet Take 1 tablet (4 mg total) by mouth every 6 (six) hours. 12 tablet Rosemarie Ax, MD     PDMP not reviewed this encounter.   Rosemarie Ax, MD 11/29/19 [id number]

## 2019-11-29 NOTE — ED Triage Notes (Signed)
Patient presents to Urgent Care with complaints of abdominal pain since earlier today. Patient reports it is her upper/mid stomach, is "somewhere between pain and a burning sensation". Pt denies consuming anything spicy or fried today or yesterday. Pt has been trying to do a keto diet, has been taking a magnesium salt (?) and minerals.  Translator used for triage.

## 2019-11-30 ENCOUNTER — Telehealth: Payer: Self-pay | Admitting: Family Medicine

## 2019-11-30 MED FILL — ONDANSETRON HCL 4 MG TABLET: 4 | 3 days supply | Qty: 12 | Fill #0

## 2019-11-30 NOTE — Telephone Encounter (Signed)
Informed of results of cbc, amylase and lipase.   Myra Rude, MD [facility] 11/30/2019, 8:40 AM

## 2019-11-30 NOTE — Telephone Encounter (Signed)
Updated on cmp. Spanish language line used.   Myra Rude, MD [facility] 11/30/2019, 9:34 AM

## 2019-12-01 ENCOUNTER — Encounter (HOSPITAL_COMMUNITY): Payer: Self-pay

## 2019-12-01 ENCOUNTER — Emergency Department (HOSPITAL_COMMUNITY)
Admission: EM | Admit: 2019-12-01 | Discharge: 2019-12-01 | Disposition: A | Discharge status: Home / Self Care | Payer: Self-pay | Attending: Emergency Medicine | Admitting: Emergency Medicine

## 2019-12-01 ENCOUNTER — Emergency Department (HOSPITAL_COMMUNITY): Payer: Self-pay

## 2019-12-01 ENCOUNTER — Other Ambulatory Visit: Payer: Self-pay

## 2019-12-01 DIAGNOSIS — Z20822 Contact with and (suspected) exposure to covid-19: Secondary | ICD-10-CM | POA: Insufficient documentation

## 2019-12-01 DIAGNOSIS — R1010 Upper abdominal pain, unspecified: Secondary | ICD-10-CM

## 2019-12-01 DIAGNOSIS — R10816 Epigastric abdominal tenderness: Secondary | ICD-10-CM | POA: Insufficient documentation

## 2019-12-01 DIAGNOSIS — Z793 Long term (current) use of hormonal contraceptives: Secondary | ICD-10-CM | POA: Insufficient documentation

## 2019-12-01 DIAGNOSIS — Z79899 Other long term (current) drug therapy: Secondary | ICD-10-CM | POA: Insufficient documentation

## 2019-12-01 DIAGNOSIS — K529 Noninfective gastroenteritis and colitis, unspecified: Secondary | ICD-10-CM | POA: Insufficient documentation

## 2019-12-01 DIAGNOSIS — R112 Nausea with vomiting, unspecified: Secondary | ICD-10-CM | POA: Insufficient documentation

## 2019-12-01 LAB — COMPREHENSIVE METABOLIC PANEL
ALT: 26 U/L (ref 0–44)
AST: 20 U/L (ref 15–41)
Albumin: 4.5 g/dL (ref 3.5–5.0)
Alkaline Phosphatase: 73 U/L (ref 38–126)
Anion gap: 11 (ref 5–15)
BUN: 15 mg/dL (ref 6–20)
CO2: 26 mmol/L (ref 22–32)
Calcium: 10.2 mg/dL (ref 8.9–10.3)
Chloride: 104 mmol/L (ref 98–111)
Creatinine, Ser: 0.86 mg/dL (ref 0.44–1.00)
GFR calc Af Amer: >60 mL/min (ref >60)
GFR calc non Af Amer: >60 mL/min (ref >60)
Glucose, Bld: 91 mg/dL (ref 70–99)
Potassium: 4.3 mmol/L (ref 3.5–5.1)
Sodium: 141 mmol/L (ref 135–145)
Total Bilirubin: 0.4 mg/dL (ref 0.3–1.2)
Total Protein: 7.4 g/dL (ref 6.5–8.1)

## 2019-12-01 LAB — URINALYSIS, ROUTINE W REFLEX MICROSCOPIC
Bilirubin Urine: NEGATIVE
Glucose, UA: NEGATIVE mg/dL
Hgb urine dipstick: NEGATIVE
Ketones, ur: NEGATIVE mg/dL
Leukocytes,Ua: NEGATIVE
Nitrite: NEGATIVE
Protein, ur: NEGATIVE mg/dL
Specific Gravity, Urine: 1.016 (ref 1.005–1.030)
pH: 5 (ref 5.0–8.0)

## 2019-12-01 LAB — I-STAT BETA HCG BLOOD, ED (MC, WL, AP ONLY): I-stat hCG, quantitative: <5 m[IU]/mL (ref <5)

## 2019-12-01 LAB — CBC
HCT: 44.9 % (ref 36.0–46.0)
Hemoglobin: 14.5 g/dL (ref 12.0–15.0)
MCH: 28.7 pg (ref 26.0–34.0)
MCHC: 32.3 g/dL (ref 30.0–36.0)
MCV: 88.9 fL (ref 80.0–100.0)
Platelets: 272 10*3/uL (ref 150–400)
RBC: 5.05 MIL/uL (ref 3.87–5.11)
RDW: 12.3 % (ref 11.5–15.5)
WBC: 8.5 10*3/uL (ref 4.0–10.5)
nRBC: 0 % (ref 0.0–0.2)

## 2019-12-01 LAB — LIPASE, BLOOD: Lipase: 21 U/L (ref 11–51)

## 2019-12-01 MED ORDER — SUCRALFATE 1 G PO TABS: 1.0000 g | ORAL_TABLET | Freq: Three times a day (TID) | ORAL | 0 refills | Status: DC

## 2019-12-01 MED ORDER — SODIUM CHLORIDE 0.9% FLUSH: 3.0000 mL | Freq: Once | INTRAVENOUS | Status: DC

## 2019-12-01 MED ORDER — OMEPRAZOLE 20 MG PO CPDR: 20.0000 mg | DELAYED_RELEASE_CAPSULE | Freq: Every day | ORAL | 0 refills | Status: DC

## 2019-12-01 MED ORDER — SODIUM CHLORIDE 0.9 % IV BOLUS
1000.0000 mL | Freq: Once | INTRAVENOUS | Status: AC
  Administered 2019-12-01: 19:00:00 1000 mL via INTRAVENOUS

## 2019-12-01 MED ORDER — IOHEXOL 300 MG/ML  SOLN
100.0000 mL | Freq: Once | INTRAMUSCULAR | Status: AC | PRN
  Administered 2019-12-01: 20:00:00 100 mL via INTRAVENOUS

## 2019-12-01 MED ORDER — FENTANYL CITRATE (PF) 100 MCG/2ML IJ SOLN
50.0000 ug | Freq: Once | INTRAMUSCULAR | Status: AC
  Administered 2019-12-01: 50 ug via INTRAVENOUS
  Filled 2019-12-01: qty 2

## 2019-12-01 MED ORDER — ONDANSETRON 4 MG PO TBDP: 4.0000 mg | ORAL_TABLET | Freq: Four times a day (QID) | ORAL | 0 refills | Status: DC | PRN

## 2019-12-01 MED ORDER — PANTOPRAZOLE SODIUM 40 MG IV SOLR
40.0000 mg | Freq: Once | INTRAVENOUS | Status: AC
  Administered 2019-12-01: 19:00:00 40 mg via INTRAVENOUS
  Filled 2019-12-01: qty 40

## 2019-12-01 NOTE — ED Notes (Signed)
Patient verbalizes understanding of discharge instructions. Opportunity for questioning and answers were provided. Armband removed by staff, pt discharged from ED.  

## 2019-12-01 NOTE — ED Triage Notes (Signed)
Onset 2 days ago upper mid abdominal pain and vomiting.  No vomiting today, only nausea.  Was seen at urgent care 09-27-20

## 2019-12-01 NOTE — ED Provider Notes (Signed)
[facility] EMERGENCY DEPARTMENT Provider Note   CSN: [id number] Arrival date & time: 12/01/19  1608     History Chief Complaint  Patient presents with  . Abdominal Pain    Kimberly Reid is a 29 y.o. female with no significant past medical history who presents today for evaluation of abdominal pain.  She reports that on Monday she started having upper abdominal pain and vomiting.  She originally went to urgent care and was discharged. She states that she did have diarrhea Monday morning and has not had any since.  She vomited 4 times on Monday, and only once today.  She denies any history of similar.  Her pain waxes and wanes.  It generally increases when she eats however it is not consistently related.  She denies any fevers or known sick contacts.  No cough or shortness of breath.  All interactions with patient performed through professional Spanish medical interpreter. HPI     Past Medical History:  Diagnosis Date  . 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 Gestational diabetes    with first preg  . Heart murmur   . PCOS (polycystic ovarian syndrome)   . Polycystic ovary syndrome 03/24/2013    Patient Active Problem List   Diagnosis Date Noted  . Normal labor 06/17/2019  . COVID-19 virus infection 05/20/2019  . Language barrier 03/29/2019  . History of gestational diabetes in prior pregnancy, currently pregnant 02/15/2019  . Supervision of high risk pregnancy, antepartum 11/19/2018  . PCOS (polycystic ovarian syndrome) 05/26/2013  . 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 CARDIAC SURGERY    . COARCTATION OF AORTA REPAIR       OB History    Gravida  2   Para  2   Term  2   Preterm  0   AB  0   Living  2     SAB  0   TAB  0   Ectopic  0   Multiple  0   Live Births  2           Family History    Problem Relation Age of Onset  . Diabetes Mother   . Healthy Father   . Congenital Murmur Sister     Social History   Tobacco Use  . Smoking status: Never Smoker  . Smokeless tobacco: Never Used  Substance Use Topics  . Alcohol use: Not Currently  . Drug use: No    Home Medications Prior to Admission medications   Medication Sig Start Date End Date Taking? Authorizing Provider  acetaminophen (TYLENOL) 325 MG tablet Take 2 tablets (650 mg total) by mouth every 4 (four) hours as needed (for pain scale < 4). 06/20/19   Venora Maples, MD  hydrOXYzine (VISTARIL) 25 MG capsule Take 1-2 capsules (25-50 mg total) by mouth at bedtime as needed for anxiety. 06/03/19   Rasch, Victorino Dike I, NP  ibuprofen (ADVIL) 600 MG tablet Take 1 tablet (600 mg total) by mouth every 6 (six) hours. 06/20/19   Venora Maples, MD  Norethindrone Acetate-Ethinyl Estrad-FE (LOESTRIN 24 FE) 1-20 MG-MCG(24) tablet Take 1 tablet by mouth daily. 07/27/19   Marylene Land, CNM  omeprazole (PRILOSEC) 20 MG capsule Take 1 capsule (20 mg total) by mouth daily for 14 days. 12/01/19 12/15/19  Cristina Gong, PA-C  ondansetron (  ZOFRAN ODT) 4 MG disintegrating tablet Take 1 tablet (4 mg total) by mouth every 6 (six) hours as needed for nausea or vomiting. 12/01/19   Lorin Glass, PA-C  ondansetron (ZOFRAN) 4 MG tablet Take 1 tablet (4 mg total) by mouth every 6 (six) hours. 11/29/19   Rosemarie Ax, MD  Prenatal Vit-Fe Fumarate-FA (MULTIVITAMIN-PRENATAL) 27-0.8 MG TABS tablet Take 1 tablet by mouth daily at 12 noon.    [provider]  sucralfate (CARAFATE) 1 g tablet Take 1 tablet (1 g total) by mouth 4 (four) times daily -  with meals and at bedtime for 14 days. 12/01/19 12/15/19  Lorin Glass, PA-C    Allergies    Patient has no known allergies.  Review of Systems   Review of Systems  Constitutional: Negative for chills and fever.  Eyes: Negative for visual disturbance.   Respiratory: Negative for cough and shortness of breath.   Cardiovascular: Negative for chest pain.  Gastrointestinal: Positive for abdominal pain, diarrhea, nausea and vomiting. Negative for blood in stool.  Genitourinary: Negative for dysuria, frequency and urgency.  Musculoskeletal: Negative for back pain.  Skin: Negative for color change, rash and wound.  Neurological: Negative for headaches.  Psychiatric/Behavioral: Negative for confusion.  All other systems reviewed and are negative.   Physical Exam Updated Vital Signs BP 140/90   Pulse (!) 55   Temp 98.8 F (37.1 C) (Oral)   Resp 14   LMP 11/17/2019   SpO2 99%   Physical Exam Vitals and nursing note reviewed.  Constitutional:      General: She is not in acute distress.    Appearance: She is well-developed.  HENT:     Head: Normocephalic and atraumatic.     Mouth/Throat:     Mouth: Mucous membranes are moist.  Eyes:     Conjunctiva/sclera: Conjunctivae normal.  Cardiovascular:     Rate and Rhythm: Normal rate and regular rhythm.     Heart sounds: No murmur.  Pulmonary:     Effort: Pulmonary effort is normal. No respiratory distress.     Breath sounds: Normal breath sounds.  Abdominal:     Palpations: Abdomen is soft.     Tenderness: There is abdominal tenderness in the epigastric area.     Hernia: No hernia is present.  Musculoskeletal:     Cervical back: Neck supple.  Skin:    General: Skin is warm and dry.  Neurological:     General: No focal deficit present.     Mental Status: She is alert.     Cranial Nerves: No cranial nerve deficit.  Psychiatric:        Mood and Affect: Mood normal.        Behavior: Behavior normal.     ED Results / Procedures / Treatments   Labs (all labs ordered are listed, but only abnormal results are displayed) Labs Reviewed  NOVEL CORONAVIRUS, NAA (HOSP ORDER, SEND-OUT TO REF LAB; TAT 18-24 HRS)  LIPASE, BLOOD  COMPREHENSIVE METABOLIC PANEL  CBC  URINALYSIS, ROUTINE  W REFLEX MICROSCOPIC  I-STAT BETA HCG BLOOD, ED ([facility], [facility], [facility] ONLY)    EKG None  Radiology CT Abdomen Pelvis W Contrast  Result Date: 12/01/2019 CLINICAL DATA:  Right upper quadrant abdominal pain onset 2 days ago. Vomiting. EXAM: CT ABDOMEN AND PELVIS WITH CONTRAST TECHNIQUE: Multidetector CT imaging of the abdomen and pelvis was performed using the standard protocol following bolus administration of intravenous contrast. CONTRAST:  145mL OMNIPAQUE IOHEXOL 300 MG/ML  SOLN COMPARISON:  Minimal dependent subsegmental atelectasis in both lower lobes. Mild cardiomegaly. FINDINGS: Lower chest: Hazy hypodensity in segment 4 of the liver adjacent to the falciform ligament favoring focal hepatic steatosis. Gallbladder unremarkable. No biliary dilatation. Hepatobiliary: Unremarkable Pancreas: Unremarkable Spleen: Unremarkable Adrenals/Urinary Tract: Unremarkable Stomach/Bowel: Unremarkable. Normal appendix. Vascular/Lymphatic: Unremarkable aside from an incidental accessory left iliac vein. Reproductive: Unremarkable Other: No supplemental non-categorized findings. Musculoskeletal: Unremarkable IMPRESSION: 1. A cause for the patient's right upper quadrant abdominal pain is not identified. 2. Incidental accessory left iliac vein, a venous variant. Electronically Signed   By: Gaylyn Rong M.D.   On: 12/01/2019 20:03    Procedures Procedures (including critical care time)  Medications Ordered in ED Medications  sodium chloride flush (NS) 0.9 % injection 3 mL (0 mLs Intravenous Hold 12/01/19 2032)  fentaNYL (SUBLIMAZE) injection 50 mcg (50 mcg Intravenous Given 12/01/19 1843)  sodium chloride 0.9 % bolus 1,000 mL (0 mLs Intravenous Stopped 12/01/19 2032)  pantoprazole (PROTONIX) injection 40 mg (40 mg Intravenous Given 12/01/19 1843)  iohexol (OMNIPAQUE) 300 MG/ML solution 100 mL (100 mLs Intravenous Contrast Given 12/01/19 1945)    ED Course  I have reviewed the triage vital signs and the nursing  notes.  Pertinent labs & imaging results that were available during my care of the patient were reviewed by me and considered in my medical decision making (see chart for details).    MDM Rules/Calculators/A&P                       Patient presents today for evaluation of upper abdominal pain with nausea, vomiting, and diarrhea for the past 3 days.  On exam she has significant tenderness to palpation in the upper abdomen primarily in the epigastric area.    Labs are obtained and reviewed, CBC, CMP, lipase, UA all are unremarkable.  Pregnancy test is negative.  Covid testing was sent.  CT scan abdomen pelvis was obtained without evidence of obstruction, inflammation, cholecystitis, pancreatitis or other cause for her symptoms.  Suspect viral gastroenteritis.  Vitals are stable.  She was given IV fluids while in the emergency room along with IV Protonix and reported that she felt significantly better.  She is discharged with prescriptions for Carafate, Prilosec, and Zofran.  Return precautions were discussed with patient who states their understanding.  At the time of discharge patient denied any unaddressed complaints or concerns.  Patient is agreeable for discharge home.  Note: Portions of this report may have been transcribed using voice recognition software. Every effort was made to ensure accuracy; however, inadvertent computerized transcription errors may be present  Final Clinical Impression(s) / ED Diagnoses Final diagnoses:  Pain of upper abdomen  Gastroenteritis    Rx / DC Orders ED Discharge Orders         Ordered    sucralfate (CARAFATE) 1 g tablet  3 times daily with meals & bedtime     12/01/19 2049    omeprazole (PRILOSEC) 20 MG capsule  Daily     12/01/19 2049    ondansetron (ZOFRAN ODT) 4 MG disintegrating tablet  Every 6 hours PRN     12/01/19 2049           Norman Clay 12/01/19 2149    Linwood Dibbles, MD 12/01/19 [phone]

## 2019-12-02 MED FILL — SUCRALFATE 1 GM TABLET: 1 | 14 days supply | Qty: 56 | Fill #0

## 2019-12-02 MED FILL — OMEPRAZOLE 20 MG CAP: 20 | 14 days supply | Qty: 14 | Fill #0

## 2019-12-02 MED FILL — ONDANSETRON ODT 4 MG TABLET: 4 | 5 days supply | Qty: 20 | Fill #0

## 2019-12-03 LAB — NOVEL CORONAVIRUS, NAA (HOSP ORDER, SEND-OUT TO REF LAB; TAT 18-24 HRS): SARS-CoV-2, NAA: NOT DETECTED

## 2019-12-07 NOTE — Progress Notes (Signed)
Patient ID: Kimberly Reid, female   DOB: 09-17-1991, 29 y.o.   MRN: [id number] Virtual Visit via Telephone Note  I connected with Coral Spikes on 12/08/19 at 10:50 AM EST by telephone and verified that I am speaking with the correct person using two identifiers.   I discussed the limitations, risks, security and privacy concerns of performing an evaluation and management service by telephone and the availability of in person appointments. I also discussed with the patient that there may be a patient responsible charge related to this service. The patient expressed understanding and agreed to proceed.  PATIENT visit by telephone virtually in the context of Covid-19 pandemic. Patient location:  home My Location:  [facility] office Persons on the call:  Me, the patient, and Aldolfo(interpreter)    History of Present Illness: After ED visit 12/01/2019 for abdominal pain.  She is taking omeprazole with relief.  Pain is much better.  No fever.  Appetite is good.  No vomiting.    From A/P: Patient presents today for evaluation of upper abdominal pain with nausea, vomiting, and diarrhea for the past 3 days.  On exam she has significant tenderness to palpation in the upper abdomen primarily in the epigastric area.    Labs are obtained and reviewed, CBC, CMP, lipase, UA all are unremarkable.  Pregnancy test is negative.  Covid testing was sent.  CT scan abdomen pelvis was obtained without evidence of obstruction, inflammation, cholecystitis, pancreatitis or other cause for her symptoms.  Suspect viral gastroenteritis.  Vitals are stable.  She was given IV fluids while in the emergency room along with IV Protonix and reported that she felt significantly better.  She is discharged with prescriptions for Carafate, Prilosec, and Zofran.  Return precautions were discussed with patient who states their understanding.  At the time of discharge patient denied any unaddressed complaints or concerns.   Patient is agreeable for discharge home.    Observations/Objective:  NAD.  A&Ox3   Assessment and Plan: NAD.  A&Ox3   Follow Up Instructions: See PCP in 3 months;  Sooner if needed   I discussed the assessment and treatment plan with the patient. The patient was provided an opportunity to ask questions and all were answered. The patient agreed with the plan and demonstrated an understanding of the instructions.   The patient was advised to call back or seek an in-person evaluation if the symptoms worsen or if the condition fails to improve as anticipated.  I provided 12 minutes of non-face-to-face time during this encounter.   Georgian Co, PA-C

## 2019-12-08 ENCOUNTER — Other Ambulatory Visit: Payer: Self-pay

## 2019-12-08 ENCOUNTER — Ambulatory Visit: Payer: Self-pay | Attending: Nurse Practitioner | Admitting: Physician Assistant

## 2019-12-08 DIAGNOSIS — Z09 Encounter for follow-up examination after completed treatment for conditions other than malignant neoplasm: Secondary | ICD-10-CM

## 2019-12-08 DIAGNOSIS — R739 Hyperglycemia, unspecified: Secondary | ICD-10-CM

## 2019-12-08 DIAGNOSIS — K2970 Gastritis, unspecified, without bleeding: Secondary | ICD-10-CM

## 2019-12-08 MED ORDER — OMEPRAZOLE 20 MG PO CPDR: 20.0000 mg | DELAYED_RELEASE_CAPSULE | Freq: Every day | ORAL | 0 refills | Status: DC

## 2019-12-08 NOTE — Progress Notes (Signed)
Hopsital f /u  Stated she feeling great at his time/ Taking prescribed med

## 2020-03-06 ENCOUNTER — Ambulatory Visit: Payer: Self-pay | Attending: Nurse Practitioner | Admitting: Nurse Practitioner

## 2020-03-06 ENCOUNTER — Other Ambulatory Visit: Payer: Self-pay

## 2020-03-27 IMAGING — DX PORTABLE CHEST - 1 VIEW
1 series · 1 of 1 positions shown · non-contrast
Comparison: None.

CLINICAL DATA: Sternal pain.

EXAM:
PORTABLE CHEST 1 VIEW

[chest ap]
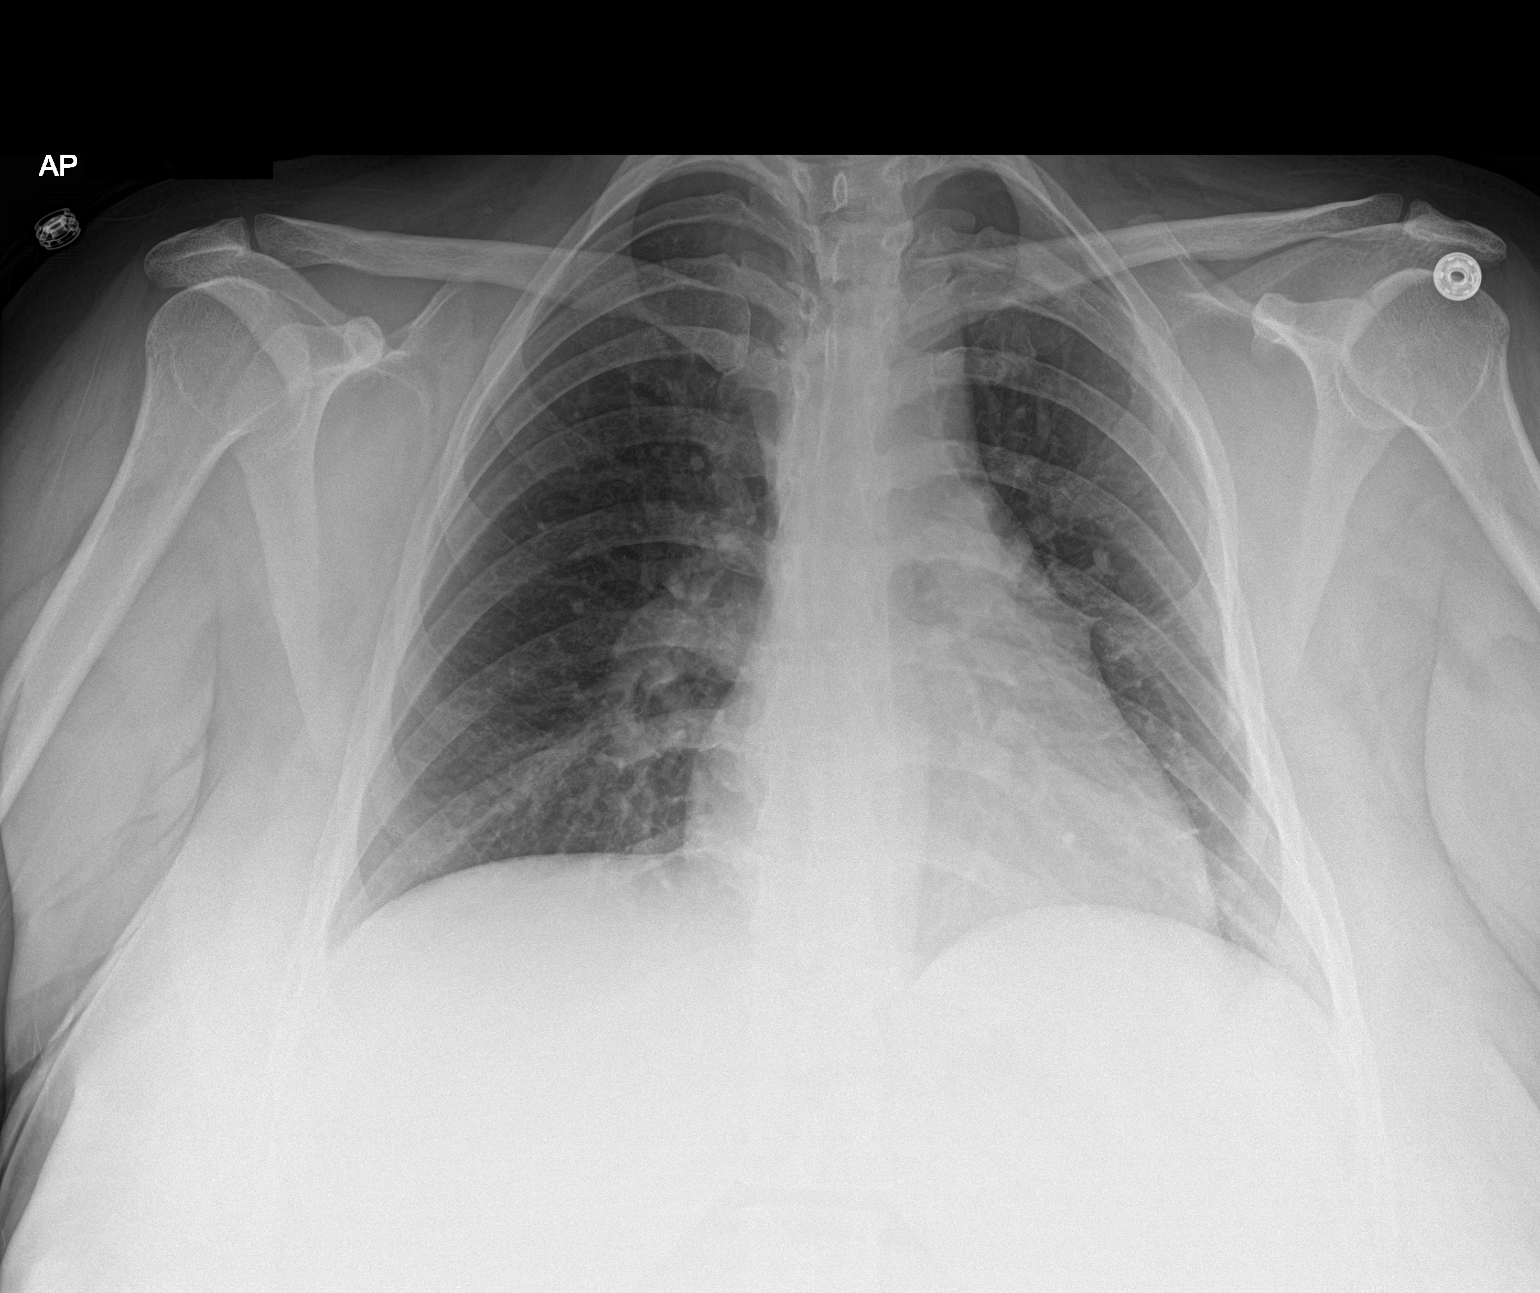

[1 of 1 positions shown; findings below may reference images not displayed]

FINDINGS: The lungs are clear without focal pneumonia, edema, pneumothorax or
pleural effusion. Cardiopericardial silhouette is at upper limits of
normal for size. The visualized bony structures of the thorax are
intact.
IMPRESSION: No active disease.

## 2020-10-28 NOTE — L&D Delivery Note (Signed)
Delivery Note:   [id number] at [redacted]w[redacted]d  Admitting diagnosis: Post term pregnancy over 40 weeks [O48.0] Risks: Hx of repair coarction of aorta as child, PCOS Onset of labor: 1252 IOL/Augmentation: Cytotec ROM: 11/27 @ 1252  Complete dilation at 09/23/2021  1449 Onset of pushing at 1451 FHR second stage 145  Analgesia /Anesthesia intrapartum:Epidural  Pushing in lithotomy position with CNM and L&D staff support, husband present for birth and supportive.  Delivery of a Live born female  Birth Weight:   APGAR: 9, 9  Newborn Delivery   Birth date/time: 09/23/2021 14:54:00 Delivery type: Vaginal, Spontaneous      in cephalic presentation, ROA. Tight nuchal x2 delivered by somersault into left thigh and placed onto mom's chest.   APGAR:1 min-9 , 5 min-9   Nuchal Cord: Yes  Cord double clamped after cessation of pulsation, cut by dad.  Collection of cord blood for typing completed. Cord blood donation-None  Arterial cord blood sample-No    Placenta delivered-Spontaneous  with 3 vessels . Uterotonics: IV Pitocin Placenta to L&D Uterine tone firm  Bleeding light  None  laceration identified.  Episiotomy:None  Local analgesia: N/A  Repair: N/A Est. Blood Loss (mL):62.00   Complications: None  Mom to postpartum.  Baby Camila to Couplet care / Skin to Skin.  Delivery Report:  Review the Delivery Report for details.    Trellis Moment, SNM 09/23/2021, 3:38 PM

## 2021-02-16 ENCOUNTER — Other Ambulatory Visit: Payer: Self-pay

## 2021-02-16 ENCOUNTER — Other Ambulatory Visit (INDEPENDENT_AMBULATORY_CARE_PROVIDER_SITE_OTHER): Payer: Self-pay

## 2021-02-16 DIAGNOSIS — Z3481 Encounter for supervision of other normal pregnancy, first trimester: Secondary | ICD-10-CM

## 2021-02-16 DIAGNOSIS — Z3201 Encounter for pregnancy test, result positive: Secondary | ICD-10-CM

## 2021-02-16 LAB — POCT URINALYSIS DIP (MANUAL ENTRY)
Bilirubin, UA: NEGATIVE
Blood, UA: NEGATIVE
Glucose, UA: NEGATIVE mg/dL
Ketones, POC UA: NEGATIVE mg/dL
Leukocytes, UA: NEGATIVE
Nitrite, UA: NEGATIVE
Protein Ur, POC: NEGATIVE mg/dL
Spec Grav, UA: 1.02 (ref 1.010–1.025)
Urobilinogen, UA: 0.2 E.U./dL
pH, UA: 7.5 (ref 5.0–8.0)

## 2021-02-19 LAB — OBSTETRIC PANEL, INCLUDING HIV
Antibody Screen: NEGATIVE
Basophils Absolute: 0 10*3/uL (ref 0.0–0.2)
Basos: 1 %
EOS (ABSOLUTE): 0.1 10*3/uL (ref 0.0–0.4)
Eos: 2 %
HIV Screen 4th Generation wRfx: NONREACTIVE
Hematocrit: 38.3 % (ref 34.0–46.6)
Hemoglobin: 12.7 g/dL (ref 11.1–15.9)
Hepatitis B Surface Ag: NEGATIVE
Immature Grans (Abs): 0 10*3/uL (ref 0.0–0.1)
Immature Granulocytes: 0 %
Lymphocytes Absolute: 2.1 10*3/uL (ref 0.7–3.1)
Lymphs: 37 %
MCH: 28.7 pg (ref 26.6–33.0)
MCHC: 33.2 g/dL (ref 31.5–35.7)
MCV: 87 fL (ref 79–97)
Monocytes Absolute: 0.4 10*3/uL (ref 0.1–0.9)
Monocytes: 8 %
Neutrophils Absolute: 3 10*3/uL (ref 1.4–7.0)
Neutrophils: 52 %
Platelets: 198 10*3/uL (ref 150–450)
RBC: 4.43 x10E6/uL (ref 3.77–5.28)
RDW: 12.3 % (ref 11.7–15.4)
RPR Ser Ql: NONREACTIVE
Rh Factor: POSITIVE
Rubella Antibodies, IGG: 8.36 index (ref >0.99)
WBC: 5.7 10*3/uL (ref 3.4–10.8)

## 2021-02-19 LAB — HGB FRACTIONATION CASCADE
Hgb A2: 2.6 % (ref 1.8–3.2)
Hgb A: 97.4 % (ref 96.4–98.8)
Hgb F: 0 % (ref 0.0–2.0)
Hgb S: 0 %

## 2021-02-19 LAB — CULTURE, OB URINE

## 2021-02-19 LAB — URINE CULTURE, OB REFLEX

## 2021-02-19 LAB — HEPATITIS C ANTIBODY
HCV Ab: NEGATIVE
Hep C Virus Ab: <0.1 s/co ratio (ref 0.0–0.9)

## 2021-02-22 NOTE — Progress Notes (Signed)
Patient Name: Kimberly Reid Date of Birth: [birth year] [facility] Initial Prenatal Visit  Kimberly Reid is a 30 y.o. year old G3P2002 at who presents for her initial prenatal visit. Pregnancy is not planned. Had period in January. She had no period in Feb and pregnancy test was negative. She had a positive home pregnancy test in March. She has her husband are excited to have another baby. She reports frequent urination and nausea. She is taking a prenatal vitamin. She denies pelvic pain or vaginal bleeding.   Pregnancy Dating: . The patient is dated by LMP . LMP: Janurary 3-8th . Period is certain:  Yes.  . Periods were regular:  No.  . LMP was a typical period:  Yes.  . Using hormonal contraception in 3 months prior to conception: Yes  Lab Review: . Blood type: O . Rh Status: + . Antibody screen: Negative . HIV: Negative . RPR: Negative . Hemoglobin electrophoresis reviewed: Yes . Results of OB urine culture are: Positive for Proteus Miralibas. Repeat OB urine today. Patient asymptomatic.  . Rubella: Immune . Varicella status is Immune. Had chicken pox when she was little.   PMH: Reviewed and as detailed below: . HTN: No  . Type 1 or 2 Diabetes: No  . Depression:  No  . Seizure disorder:  No . VTE: No ,  . History of STI No,  . Abnormal Pap smear:  No, . Genital herpes simplex:  No   PSH: . Gynecologic Surgery:  no . Surgical history reviewed, notable for: none  Obstetric History: . Obstetric history tab updated and reviewed.  . Summary of prior pregnancies: induced for suspected LGA in  2017 vaginal term baby boy 8lb 1.6 oz, 2020 vaginal term baby boy 7 1b 7.5 oz.  . Cesarean delivery: No  . Gestational Diabetes:  Yes in the first pregnancy.  . Hypertension in pregnancy: No . History of preterm birth: No . History of LGA/SGA infant:  Yes - first pregnancy, she was induced 3 days early. Ultrasound showed 10lb however baby boy weighed  7lb 7.5 oz. . History of shoulder dystocia: No  . Indications for referral were reviewed, and the patient has obstetric indications for referral to High Risk OB Clinic at this time.   Social History: . Partner's name: Vena Rua   . Tobacco use: No . Alcohol use:  No . Other substance use:  No  Current Medications:  . Prenatal vitamins and Tylenol   . Reviewed and appropriate in pregnancy.   Genetic and Infection Screen: . Flow Sheet Updated Yes  Prenatal Exam: Gen: Well nourished, well developed.  No distress.  Vitals noted. HEENT: Normocephalic, atraumatic.  Neck supple without cervical lymphadenopathy, thyromegaly or thyroid nodules.  Fair dentition. CV: RRR no murmur appreciated, no gallops or rubs Lungs: CTA B.  Normal respiratory effort without wheezes or rales. Abd: soft, NTND. +BS.  Uterus not appreciated above pelvis. GU: Normal external female genitalia without lesions.  Nl vaginal, well rugated without lesions. No vaginal discharge.  Bimanual exam: No adnexal mass or TTP. No CMT.   Ext: No clubbing, cyanosis or edema. Psych: Normal grooming and dress.  Not depressed or anxious appearing.  Normal thought content and process without flight of ideas or looseness of associations  Fetal heart tones: Not detected and discussed with preceptor, dating ultrasound ordered.   Assessment/Plan:  Raychell Holcomb is a 30 y.o. [id number] at Unknown who presents to initiate prenatal care. She is  doing well.  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   1. Routine prenatal care: Marland Kitchen As dating is not reliable, a dating ultrasound has been ordered. Dating tab updated. LMP 10/30/20. Patient has irregular periods. Likely got pregnant in late February after her negative test.  . Pre-pregnancy weight updated. Expected weight gain this pregnancy is 11-20 pounds  . Prenatal labs reviewed. . Indications for referral to HROB were reviewed and the patient does  meet criteria for referral. Referral placed.  . Medication list reviewed and updated.  . Recommended patient see a dentist for regular care.  . Bleeding and pain precautions reviewed. . Importance of prenatal vitamins reviewed.  . Genetic screening offered. Patient would like genetic screening however GA unclear at this time. Discuss at future visit.  . The patient will not be age 31 or over at time of delivery. Referral to genetic counseling was not offered today.  . The patient has the following risk factors for preexisting diabetes: BMI >25 and history of GDM . An early 1 hour glucose tolerance test was not ordered. Will need early screening at high risk OB.  Marland Kitchen Pregnancy Medical Home completed but pt referred to high risk OB and PHQ-9 forms completed, problems noted: No  2. 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 Seen by Dr. Jens Som at [facility] in 2020.  Referred to high risk OB   3. Hx of PCOS: Pt has irregular periods. Dating ultrasound ordered and scheudled.   4. Hx of gestational diabetes in first pregnancy. Will need a early gtt at next visit.    Follow up 4 weeks for next prenatal visit if not scheduled for OB visit.    Katha Cabal, DO PGY-2, [facility] 02/23/2021

## 2021-02-23 ENCOUNTER — Ambulatory Visit (INDEPENDENT_AMBULATORY_CARE_PROVIDER_SITE_OTHER): Payer: Self-pay | Admitting: Family Medicine

## 2021-02-23 ENCOUNTER — Encounter: Payer: Self-pay | Admitting: Family Medicine

## 2021-02-23 ENCOUNTER — Other Ambulatory Visit: Payer: Self-pay

## 2021-02-23 VITALS — BP 122/74 | HR 67 | Wt 182.4 lb

## 2021-02-23 DIAGNOSIS — Z3490 Encounter for supervision of normal pregnancy, unspecified, unspecified trimester: Secondary | ICD-10-CM

## 2021-02-23 DIAGNOSIS — O0990 Supervision of high risk pregnancy, unspecified, unspecified trimester: Secondary | ICD-10-CM

## 2021-02-23 DIAGNOSIS — Z3480 Encounter for supervision of other normal pregnancy, unspecified trimester: Secondary | ICD-10-CM

## 2021-02-23 NOTE — Patient Instructions (Signed)
  Fue genial verte hoy! Felicidades!   La hemos derivado a la clnica obsttrica de Conservator, museum/gallery. Asegrese de buscar una llamada telefnica para programar su cita. Si no ha programado una cita en 4 semanas, llame a nuestra oficina para programar una cita.  Asegrese de obtener su ultrasonido de citas en el departamento de salud!   Si tiene preguntas o inquietudes, no dude en llamar al [phone].  Dra Rachael Darby   It was great seeing you today! Congratulations!    We have referred you to the high risk OB clinic. Be sure to look out for a phone call to schedule your appointment. If you have not scheduled an appointment in 4 weeks, call our office to schedule an appointment.   Be sure to get your dating ultrasound at the health department!    If you have questions or concerns please do not hesitate to call at [phone].  Dr. Katherina Right [facility]

## 2021-02-23 NOTE — Progress Notes (Signed)
Contacted pt to let her know that I scheduled her ultrasound appt.I used the language line and spoke with Arlen [phone] to relay the message.

## 2021-02-25 LAB — CULTURE, OB URINE

## 2021-02-25 LAB — URINE CULTURE, OB REFLEX

## 2021-03-09 ENCOUNTER — Telehealth: Payer: Self-pay | Admitting: Internal Medicine

## 2021-04-02 ENCOUNTER — Other Ambulatory Visit: Payer: Self-pay

## 2021-04-02 ENCOUNTER — Ambulatory Visit (INDEPENDENT_AMBULATORY_CARE_PROVIDER_SITE_OTHER): Payer: Self-pay | Admitting: Family Medicine

## 2021-04-02 VITALS — BP 120/60 | HR 76 | Wt 184.4 lb

## 2021-04-02 DIAGNOSIS — Z3A15 15 weeks gestation of pregnancy: Secondary | ICD-10-CM

## 2021-04-02 DIAGNOSIS — Z3481 Encounter for supervision of other normal pregnancy, first trimester: Secondary | ICD-10-CM

## 2021-04-02 DIAGNOSIS — O0990 Supervision of high risk pregnancy, unspecified, unspecified trimester: Secondary | ICD-10-CM

## 2021-04-02 NOTE — Progress Notes (Signed)
       Patient Name: Kimberly Reid Date of Birth: [birth year] [facility] Prenatal Visit  Kimberly Reid is a 30 y.o. [id number] at [redacted]w[redacted]d here for routine follow up. She is dated by early ultrasound.  She reports pelvic cramping that lasts about a minute..  She denies vaginal bleeding, contractions, leaking of fluid.  See flow sheet for details.   Vitals:   04/02/21 1434  BP: 120/60  Pulse: 76     A/P: Pregnancy at [redacted]w[redacted]d.  Doing well.    1. Routine Prenatal Care:  Marland Kitchen Dating reviewed, dating tab is correct . Fetal heart tones Appropriate . Influenza vaccine not administered as not influenza season.   Marland Kitchen COVID vaccination was discussed and declined. Continue to offer. . The patient has the following indication for screening preexisting diabetes: BMI > 25 and high risk ethnicity (Latino, [place] American, Native American, Mal[place] Islander, Asian Naval architect) . Marland Kitchen Anatomy ultrasound ordered to be scheduled at 18-20 weeks. . Patient is interested in genetic screening. As she is past 13 weeks and 6 days, a Quad screen offered but declined due to cost. Panorama/Horizon genetic screening collected.  was offered.  . Pregnancy education including expected weight gain in pregnancy, OTC medication use, continued use of prenatal vitamin, smoking cessation if applicable, and nutrition in pregnancy.   . Bleeding and pain precautions reviewed.  2. 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 Seen by Dr. Jens Som at [facility] in 2020.  Referred to high risk OB previously but not scheduled as of yet. Pt scheduled with high risk OB today in office. Will continue care at OB/GYN.    Hx of PCOS: Pt has irregular periods. Dating ultrasound ordered and to complete at [facility] office.  Hx of gestational diabetes in first pregnancy. Early gtt ordered.   Problem list  and pregnancy  box updated: Yes.   Follow up 4 weeks.

## 2021-04-02 NOTE — Patient Instructions (Signed)
Asegrese de llamar al [facility] [place] [facility] [place] para programar una cita al [place]: [phone] o visite la direccin: [address] [place], [place] [address].   Be sure to call the [facility] to schedule an appointment at Phone: [phone] or stop by address: [address] [place], [place] [address].   Segundo trimestre de [employer] Second Trimester of Pregnancy  El segundo trimestre de Psychiatrist va desde la semana 13 hasta la semana 27. Tambin se dice que va desde el mes 4 hasta el mes 6 de [place]. Este suele ser el momento en el que mejor se siente. Durante el segundo trimestre:  Las nuseas del embarazo han disminuido o han desaparecido.  Usted puede tener ms energa.  Usted puede tener hambre con ms frecuencia. En esta poca, el beb en gestacin (feto) crece muy rpido. Hacia el final del sexto mes, el beb en gestacin puede medir aproximadamente 12 pulgadas y pesar alrededor de 1 libras. Es probable que comience a Engineer, structural beb se [employer] las 16 y las 20 [address] de Psychiatrist. Cambios en el cuerpo durante el segundo trimestre Su organismo contina atravesando por muchos cambios durante este perodo. Los cambios varan y generalmente vuelven a la normalidad despus del nacimiento del beb. Cambios fsicos  Aumentar ms peso.  Podrn aparecer las primeras estras en las caderas, el vientre (abdomen) y las [place].  Las [employer] crecern y Writer.  Pueden aparecer zonas oscuras o manchas en el rostro.  Es posible que se forme una lnea oscura desde el ombligo hasta la zona del pubis (linea nigra).  Tal vez haya cambios en el cabello. Cambios en la salud  Es posible que tenga dolores de [place].  Es posible que tenga acidez estomacal.  Es posible que tenga dificultades para defecar (estreimiento).  Es posible que tenga hemorroides o venas abultadas e hinchadas (venas varicosas).  Las encas pueden sangrarle.  Es posible que haga pis  (orine) con mayor frecuencia.  Puede sentir dolor en la espalda. Siga estas instrucciones en su casa: Medicamentos  Use los medicamentos de venta libre y los recetados solamente como se lo haya indicado el mdico. Algunos medicamentos no son seguros Academic librarian.  Tome vitaminas prenatales que contengan por lo menos (mcg) de cido flico. Comida y bebida  Consuma comidas saludables que incluyan lo siguiente: ? Nils Pyle y verduras frescas. ? Cereales integrales. ? Buenas fuentes de protenas, como carne, huevos y tofu. ? Productos lcteos con bajo contenido de grasa.  Evite la carne cruda y el [place], la [place] y el queso sin Market researcher.  Es posible que deba tomar medidas para prevenir o tratar los problemas para defecar: ? Product manager suficiente lquido para Radio producer pis (orina) de color amarillo plido. ? Come alimentos ricos en fibra. Entre ellos, frijoles, cereales integrales y frutas y verduras frescas. ? Limitar los alimentos con alto contenido de grasa y International aid/development worker. Estos incluyen alimentos fritos o dulces. Actividad  Haga ejercicios solamente como se lo haya indicado el mdico. La mayora de las personas pueden realizar su actividad fsica habitual durante el [place]. Intente realizar como mnimo de actividad fsica por lo menos 5das a la semana.  Deje de hacer ejercicio si tiene dolor o clicos en el vientre o en la zona lumbar.  No haga ejercicio si hace demasiado calor, hay demasiada humedad o se encuentra en un lugar de mucha altura (altitud elevada).  Evite levantar pesos [employer].  Si lo desea, puede continuar teniendo  relaciones sexuales, a menos que el mdico le indique lo contrario. Alivio del dolor y del Dentist  Use un sostn que le brinde buen soporte si le duelen las [place].  Dese baos de asiento con agua tibia para Engineer, materials o las molestias causadas por las hemorroides. Use una crema para las hemorroides si el mdico la  autoriza.  Descanse con las piernas levantadas (elevadas) si tiene calambres en las piernas o dolor en la parte baja de la espalda.  Si desarrolla venas abultadas en las piernas: ? Use medias de compresin segn las indicaciones de su mdico. ? Levante los pies durante , 3 o 4veces por Futures trader. ? Limite la sal en sus alimentos. Seguridad  Use el cinturn de seguridad en todo momento mientras vaya en auto.  Hable con el mdico si alguien le est haciendo dao o gritando [place]. Estilo de vida  No se d baos de inmersin en agua caliente, baos turcos ni saunas.  No se haga duchas vaginales. No use tampones ni toallas higinicas perfumadas.  Evite el contacto con las bandejas sanitarias de los gatos y la tierra que estos animales usan. Estos contienen grmenes que pueden daar al beb y causar la prdida del beb ya sea aborto espontneo o muerte fetal.  No consuma medicamentos a base de hierbas, drogas ilegales, ni medicamentos que el mdico no haya autorizado. No beba alcohol.  No fume ni consuma ningn producto que contenga nicotina o tabaco. Si necesita ayuda para dejar de fumar, consulte al mdico. Instrucciones generales  Cumpla con todas las visitas de seguimiento. Esto es importante.  Consulte a su mdico acerca de dnde se dictan clases prenatales cerca de donde vive.  Consulte a su mdico sobre los [employer]oco[employer] debe comer o pdale que la ayude a Clinical research associate a Facilities manager. Dnde buscar ms informacin  American Pregnancy Association (Asociacin Americana del Embarazo): americanpregnancy.org  [employer] Corporation of Obstetricians and Gynecologists (Colegio Estadounidense de Obstetras y Gineclogos): www.acog.org  Office on [employer] (Oficina para la Salud de la Mujer): MightyReward.co.nz Comunquese con un mdico si:  Tiene un dolor de cabeza que no desaparece despus de Science writer.  Nota cambios en la visin o ve manchas delante de los  ojos.  Tiene clicos o siente presin o dolor leves en la parte baja del vientre.  Sigue sintiendo como si fuera a vomitar (nuseas), vomita o hace deposiciones acuosas (diarrea).  Advierte lquido con mal olor que proviene de la vagina.  Siente dolor al orinar o hace orina con mal olor.  Tiene una gran hinchazn en la cara, las manos, las piernas, los tobillos o los pies.  Tiene fiebre. Solicite ayuda de inmediato si:  Tiene una prdida de lquido por la vagina.  Tiene sangrado o pequeas prdidas vaginales.  Tiene clicos o dolor muy intensos en el vientre.  Tiene dificultad para respirar.  Sientes dolor en el pecho.  Se desmaya.  No ha sentido que el beb se moviera durante el perodo de tiempo que le dijo el mdico.  Licensed conveyancer, hinchazn o enrojecimiento nuevos en un brazo o una pierna o se produce un aumento de alguno de estos sntomas. Resumen  El segundo trimestre de embarazo va desde la semana 13 hasta la 27 (desde el mes 4 hasta el 6).  Consuma comidas saludables.  Haga ejercicios tal como le indic el mdico. La mayora de las personas pueden realizar su actividad fsica habitual durante el [place].  No consuma medicamentos a base de hierbas, drogas  ilegales, ni medicamentos que el mdico no haya autorizado. No beba alcohol.  Llame al mdico si se enferma o si nota algo inusual acerca de su embarazo. Esta informacin no tiene Theme park manager el consejo del mdico. Asegrese de hacerle al mdico cualquier pregunta que tenga. Document Revised: 04/28/2020 Document Reviewed: 04/28/2020 Elsevier Patient Education  2021 [employer].

## 2021-04-02 NOTE — Progress Notes (Deleted)
   SUBJECTIVE:   CHIEF COMPLAINT / HPI:   Chief Complaint  Patient presents with  . ob f/u   . Arm Pain     Kimberly Reid is a 30 y.o. female here for ***   Pt reports ***    PERTINENT  PMH / PSH: reviewed and updated as appropriate   OBJECTIVE:   BP 120/60   Pulse 76   Wt 184 lb 6.4 oz (83.6 kg)   LMP 10/30/2020 (Exact Date)   BMI 32.66 kg/m   ***  ASSESSMENT/PLAN:   No problem-specific Assessment & Plan notes found for this encounter.     Katha Cabal, DO PGY-2, [facility] 04/02/2021      {    This will disappear when note is signed, click to select method of visit    :1}

## 2021-04-03 ENCOUNTER — Other Ambulatory Visit: Payer: Self-pay

## 2021-04-03 DIAGNOSIS — O0990 Supervision of high risk pregnancy, unspecified, unspecified trimester: Secondary | ICD-10-CM

## 2021-04-04 LAB — GLUCOSE TOLERANCE, 1 HOUR: Glucose, 1Hr PP: 78 mg/dL (ref 65–199)

## 2021-04-11 ENCOUNTER — Encounter: Payer: Self-pay | Admitting: Family Medicine

## 2021-04-11 ENCOUNTER — Other Ambulatory Visit: Payer: Self-pay

## 2021-04-11 ENCOUNTER — Ambulatory Visit (INDEPENDENT_AMBULATORY_CARE_PROVIDER_SITE_OTHER): Payer: Self-pay | Admitting: Family Medicine

## 2021-04-11 VITALS — BP 111/72 | HR 62 | Wt 185.0 lb

## 2021-04-11 DIAGNOSIS — Z789 Other specified health status: Secondary | ICD-10-CM

## 2021-04-11 DIAGNOSIS — Z8632 Personal history of gestational diabetes: Secondary | ICD-10-CM

## 2021-04-11 DIAGNOSIS — Z8774 Personal history of (corrected) congenital malformations of heart and circulatory system: Secondary | ICD-10-CM

## 2021-04-11 DIAGNOSIS — O0990 Supervision of high risk pregnancy, unspecified, unspecified trimester: Secondary | ICD-10-CM

## 2021-04-11 DIAGNOSIS — O09299 Supervision of pregnancy with other poor reproductive or obstetric history, unspecified trimester: Secondary | ICD-10-CM

## 2021-04-11 MED ORDER — ASPIRIN EC 81 MG PO TBEC
81.0000 mg | DELAYED_RELEASE_TABLET | Freq: Every day | ORAL | 11 refills | Status: DC
  Filled 2021-04-11: qty 30, 30d supply, fill #0

## 2021-04-11 NOTE — Patient Instructions (Signed)
Segundo trimestre de embarazo Second Trimester of Pregnancy  El segundo trimestre de embarazo va desde la semana 13 hasta la semana 27. Es decir desde el mes 4 hasta el mes 6 de embarazo. El segundo trimestre suele ser el momento en el que mejor se siente. Su organismo se ha adaptado a estarembarazada, y comienza a sentirse fsicamente mejor. Durante el segundo trimestre: Las nuseas del embarazo han disminuido o han desaparecido completamente. Usted puede tener ms energa. Es posible que tenga un aumento del apetito. El segundo trimestre es tambin un perodo en el que el beb en gestacin (feto) crece rpidamente. Hacia el final del sexto mes, el feto puede medir aproximadamente 12 pulgadas y pesar alrededor de 1 libras. Es probable que sienta que el beb se mueve (da pataditas) entre las 16 y 20semanas del embarazo. Cambios en el cuerpo durante el segundo trimestre Su cuerpo continua experimentando numerosos cambios durante su segundo trimestre. Los cambios varan y generalmente vuelven a la normalidad despusdel nacimiento del beb. Cambios fsicos Seguir aumentando de peso. Notar que la parte baja del abdomen sobresale. Podrn aparecer las primeras estras en las caderas, el abdomen y las mamas. Las mamas seguirn creciendo y se tornarn sensibles. Pueden aparecer zonas oscuras o manchas (cloasma o mscara del embarazo) en el rostro. Es posible que se forme una lnea oscura desde el ombligo hasta la zona del pubis (linea nigra). Tal vez haya cambios en el cabello. Esto cambios pueden incluir su engrosamiento, crecimiento rpido y cambios en la textura. A algunas personas tambin se les cae el cabello durante o despus del embarazo, o tienen el cabello seco o fino. Cambios en la salud Comienza a tener dolores de cabeza. Es posible que tenga acidez estomacal. Puede tener estreimiento. Pueden aparecer hemorroides o abultarse e hincharse las venas (venas varicosas). Las encas pueden  sangrar y estar sensibles al cepillado y al hilo dental. Tal vez tenga necesidad de orinar con ms frecuencia porque el feto est ejerciendo presin sobre la vejiga. Puede sentir dolor en la espalda. Esto se debe a: Aumento de peso. Las hormonas del embarazo relajan las articulaciones en la pelvis. Un cambio en el peso y los msculos que ayudan a mantener su equilibrio. Siga estas instrucciones en su casa: Medicamentos Siga las instrucciones del mdico en relacin con el uso de medicamentos. Durante el embarazo, hay medicamentos que pueden tomarse y otros que no. No tome medicamentos a menos que lo haya autorizado el mdico. Tome vitaminas prenatales que contengan por lo menos 600microgramos (mcg) de cido flico. Comida y bebida Lleve una dieta saludable que incluya frutas y verduras frescas, cereales integrales, buenas fuentes de protenas como carnes magras, huevos o tofu, y productos lcteos descremados. Evite la carne cruda y el jugo, la leche y el queso sin pasteurizar. Estos portan grmenes que pueden provocar dao tanto a usted como al beb. Es posible que tenga que tomar estas medidas para prevenir o tratar el estreimiento: Beber suficiente lquido como para mantener la orina de color amarillo plido. Consumir alimentos ricos en fibra, como frijoles, cereales integrales, y frutas y verduras frescas. Limitar el consumo de alimentos ricos en grasa y azcares procesados, como los alimentos fritos o dulces. Actividad Haga ejercicio solamente como se lo haya indicado el mdico. La mayora de las personas pueden continuar su rutina de ejercicios durante el embarazo. Intente realizar como mnimo 30minutos de actividad fsica por lo menos 5das a la semana. Deje de hacer ejercicio si comienza a tener contracciones en el tero.   Deje de hacer ejercicio si le aparecen dolor o clicos en la parte baja del vientre o de la espalda. Evite hacer ejercicio si hace mucho calor o humedad, o si se  encuentra a una altitud elevada. Evite levantar pesos excesivos. Si lo desea, puede seguir teniendo relaciones sexuales, salvo que el mdico le indique lo contrario. Alivio del dolor y del malestar Use un sujetador que le brinde buen soporte para prevenir las molestias causadas por la sensibilidad en las mamas. Dese baos de asiento con agua tibia para aliviar el dolor o las molestias causadas por las hemorroides. Use una crema para las hemorroides si el mdico la autoriza. Descanse con las piernas levantadas (elevadas) si tiene calambres en las piernas o dolor en la parte baja de la espalda. Si tiene venas varicosas: Use medias de compresin como se lo haya indicado el mdico. Eleve los pies durante 15minutos, 3 o 4veces por da. Limite el consumo de sal en su dieta. Seguridad Use el cinturn de seguridad en todo momento mientras conduce o va en auto. Hable con el mdico si es vctima de maltrato verbal o fsico. Estilo de vida No se d baos de inmersin en agua caliente, baos turcos ni saunas. No se haga duchas vaginales. No use tampones ni toallas higinicas perfumadas. Evite el contacto con las bandejas sanitarias de los gatos y la tierra que estos animales usan. Estos elementos contienen bacterias que pueden causar defectos congnitos al beb y la posible prdida del feto debido a un aborto espontneo o muerte fetal. No use remedios a base de hierbas, alcohol, drogas ilegales ni medicamentos que no estn aprobados por el mdico. Las sustancias qumicas de estos productos pueden daar al beb. No consuma ningn producto que contenga nicotina o tabaco, como cigarrillos, cigarrillos electrnicos y tabaco de mascar. Si necesita ayuda para dejar de fumar, consulte al mdico. Instrucciones generales Durante una visita prenatal de rutina, el mdico le har un examen fsico y otras pruebas. Tambin le hablar sobre su salud general. Cumpla con todas las visitas de seguimiento. Esto es  importante. Pdale al mdico que la derive a clases de educacin prenatal en su localidad. Pida ayuda si tiene necesidades nutricionales o de asesoramiento durante el embarazo. El mdico puede aconsejarla o derivarla a especialistas para que la ayuden con diferentes necesidades. Dnde buscar ms informacin American Pregnancy Association (Asociacin Americana del Embarazo): americanpregnancy.org American College of Obstetricians and Gynecologists (Colegio Estadounidense de Obstetras y Gineclogos): acog.org/womens-health/pregnancy? Office on Women's Health (Oficina para la Salud de la Mujer): womenshealth.gov/pregnancy Comunquese con un mdico si tiene: Un dolor de cabeza que no desaparece despus de tomar analgsicos. Cambios en la visin o ve manchas delante de los ojos. Clicos leves, presin en la pelvis o dolor persistente en el abdomen. Nuseas persistentes, vmitos o diarrea. Secrecin vaginal con mal olor u orina con olor ftido. Dolor al orinar. Hinchazn sbita o extrema del rostro, las manos, los tobillos, los pies o las piernas. Fiebre. Busque ayuda de inmediato si: Tiene una prdida de lquido por la vagina. Tiene sangrado ligero o manchas vaginales. Tiene dolor intenso o clicos en el abdomen. Presenta dificultad para respirar. Siente dolor en el pecho. Tiene episodios de desmayo. No ha sentido a su beb moverse durante el perodo de tiempo que le indic el mdico. Tiene dolor, hinchazn o enrojecimiento nuevos o ms intensos en un brazo o una pierna. Resumen El segundo trimestre de embarazo va desde la semana 13 hasta la 27 (desde el mes 4 hasta el 6).   No use remedios a base de hierbas, alcohol, drogas ilegales ni medicamentos que no estn aprobados por el mdico. Las sustancias qumicas de estos productos pueden daar al beb. Haga ejercicio solamente como se lo haya indicado el mdico. La mayora de las personas pueden continuar su rutina de ejercicios durante el  embarazo. Cumpla con todas las visitas de seguimiento. Esto es importante. Esta informacin no tiene como fin reemplazar el consejo del mdico. Asegresede hacerle al mdico cualquier pregunta que tenga. Document Revised: 04/24/2020 Document Reviewed: 04/24/2020 Elsevier Patient Education  2022 Elsevier Inc.  Eleccin del mtodo anticonceptivo Contraception Choices La anticoncepcin, o los mtodos anticonceptivos, hace referencia a los mtodoso dispositivos que evitan el embarazo. Mtodos hormonales  Implante anticonceptivo Un implante anticonceptivo consiste en un tubo delgado de plstico que contiene una hormona que evita el embarazo. Es diferente de un dispositivo intrauterino (DIU). Un mdico lo inserta en la parte superior del brazo. Los implantespueden ser eficaces durante un mximo de 3 aos. Inyecciones de progestina sola Las inyecciones de progestina sola contienen progestina, una forma sinttica dela hormona progesterona. Un mdico las administra cada 3 meses. Pldoras anticonceptivas Las pldoras anticonceptivas son pastillas que contienen hormonas que evitan el embarazo. Deben tomarse una vez al da, preferentemente a la misma hora cadada. Se necesita una receta para utilizar este mtodo anticonceptivo. Parche anticonceptivo El parche anticonceptivo contiene hormonas que evitan el embarazo. Se coloca en la piel, debe cambiarse una vez a la semana durante tres semanas y debe retirarse en la cuarta semana. Se necesita una receta para utilizar este mtodoanticonceptivo. Anillo vaginal Un anillo vaginal contiene hormonas que evitan el embarazo. Se coloca en la vagina durante tres semanas y se retira en la cuarta semana. Luego se repite el proceso con un anillo nuevo. Se necesita una receta para utilizar este mtodoanticonceptivo. Anticonceptivo de emergencia Los anticonceptivos de emergencia son mtodos para evitar un embarazo despus de tener sexo sin proteccin. Vienen en forma de  pldora y pueden tomarse hasta 5 das despus de tener sexo. Funcionan mejor cuando se toman lo ms pronto posible luego de tener sexo. La mayora de los anticonceptivos de emergencia estn disponibles sin receta mdica. Este mtodo no debe utilizarse como elnico mtodo anticonceptivo. Mtodos de barrera  Condn masculino Un condn masculino es una vaina delgada que se coloca sobre el pene durante el sexo. Los condones evitan que el esperma ingrese en el cuerpo de la mujer. Pueden utilizarse con un una sustancia que mata a los espermatozoides (espermicida) para aumentar la efectividad. Deben desecharse despus de un uso. Condn femenino Un condn femenino es una vaina blanda y holgada que se coloca en la vagina antes de tener sexo. El condn evita que el esperma ingrese en el cuerpo de lamujer. Deben desecharse despus de un uso. Diafragma Un diafragma es una barrera blanda con forma de cpula. Se inserta en la vagina antes del sexo, junto con un espermicida. El diafragma bloquea el ingreso de esperma en el tero, y el espermicida mata a los espermatozoides. El diafragma debe permanecer en la vagina durante 6 a 8 horas despus de tener sexo y deberetirarse en el plazo de las 24 horas. Un diafragma es recetado y colocado por un mdico. Debe reemplazarse cada 1 a 2 aos, despus de dar a luz, de aumentar ms de 15lb (6.8kg) y de una cirugaplvica. Capuchn cervical Un capuchn cervical es una copa redonda y blanda de ltex o plstico que se coloca en el cuello uterino. Se inserta en la vagina   antes del sexo, junto con un espermicida. Bloquea el ingreso del esperma en el tero. El capuchn debe permanecer en el lugar durante 6 a 8 horas despus de tener sexo y debe retirarse en el plazo de las 48 horas. Un capuchn cervical debe ser recetado ycolocado por un mdico. Debe reemplazarse cada 2aos. Esponja Una esponja es una pieza blanda y circular de espuma de poliuretano que contiene espermicida. La  esponja ayuda a bloquear el ingreso de esperma en el tero, y el espermicida mata a los espermatozoides. Para utilizarla, debe humedecerla e insertarla en la vagina. Debe insertarse antes de tener sexo, debe permanecer dentro al menos durante 6 horas despus de tener sexo y deberetirarse y desecharse en el plazo de las 30 horas. Espermicidas Los espermicidas son sustancias qumicas que matan o bloquean al esperma y no lo dejan ingresar al cuello uterino y al tero. Vienen en forma de crema, gel, supositorio, espuma o comprimido. Un espermicida debe insertarse en la vagina con un aplicador al menos 10 o 15 minutos antes de tener sexo para dar tiempo a que surta efecto. El proceso debe repetirse cada vez que tenga sexo. Losespermicidas no requieren receta mdica. Anticonceptivos intrauterinos Dispositivo intrauterino (DIU) Un DIU es un dispositivo en forma de T que se coloca en el tero. Existen dos tipos: DIU hormonal.Este tipo contiene progestina, una forma sinttica de la hormona progesterona. Este tipo puede permanecer colocado durante 3 a 5 aos. DIU de cobre.Este tipo est recubierto con un alambre de cobre. Puede permanecer colocado durante 10 aos. Mtodos anticonceptivos permanentes Ligadura de trompas en la mujer En este mtodo, se sellan, atan u obstruyen las trompas de Falopio durante unaciruga para evitar que el vulo descienda hacia el tero. Esterilizacin histeroscpica En este mtodo, se coloca un implante pequeo y flexible dentro de cada trompa de Falopio. Los implantes hacen que se forme un tejido cicatricial en las trompas de Falopio y que las obstruya para que el espermatozoide no pueda llegar al vulo. El procedimiento demora alrededor de 3 meses para que seaefectivo. Debe utilizarse otro mtodo anticonceptivo durante esos 3 meses. Esterilizacin masculina Este es un procedimiento que consiste en atar los conductos que transportan el esperma (vasectoma). Luego del procedimiento,  el hombre puede eyacular lquido (semen). Debe utilizarse otro mtodo anticonceptivo durante 3 meses despus delprocedimiento. Mtodos de planificacin natural Planificacin familiar natural En este mtodo, la pareja no tiene sexo durante los das en que la mujer podra quedar embarazada. Mtodo calendario En este mtodo, la mujer realiza un seguimiento de la duracin de cada ciclo menstrual, identifica los das en los que se puede producir un embarazo y no tiene sexo durante esos das. Mtodo de la ovulacin En este mtodo, la pareja evita tener sexo durante la ovulacin. Mtodo sintotrmico Este mtodo implica no tener sexo durante la ovulacin. Normalmente, la mujer comprueba la ovulacinal observar cambios en su temperatura y en la consistencia del moco cervical. Mtodo posovulacin En este mtodo, la pareja espera a que finalice la ovulacin para tener sexo. Dnde buscar ms informacin Centers for Disease Control and Prevention (Centros para el Control y la Prevencin de Enfermedades): www.cdc.gov Resumen La anticoncepcin, o los mtodos anticonceptivos, hace referencia a los mtodos o dispositivos que evitan el embarazo. Los mtodos anticonceptivos hormonales incluyen implantes, inyecciones, pastillas, parches, anillos vaginales y anticonceptivos de emergencia. Los mtodos anticonceptivos de barrera pueden incluir condones masculinos, condones femeninos, diafragmas, capuchones cervicales, esponjas y espermicidas. Existen dos tipos de DIU (dispositivo intrauterino). Un DIU puede colocarse en   el tero de una mujer para evitar el embarazo durante 3 a 5 aos. La esterilizacin permanente puede realizarse mediante un procedimiento tanto en los hombres como en las mujeres. Los mtodos de planificacin familiar natural implican no tener sexo durante los das en que la mujer podra quedar embarazada. Esta informacin no tiene como fin reemplazar el consejo del mdico. Asegresede hacerle al mdico  cualquier pregunta que tenga. Document Revised: 05/16/2020 Document Reviewed: 05/16/2020 Elsevier Patient Education  2022 Elsevier Inc.  

## 2021-04-11 NOTE — Progress Notes (Signed)
   Subjective:  Kimberly Reid is a 30 y.o. G3P2002 at [redacted]w[redacted]d being seen today for ongoing prenatal care.  She is currently monitored for the following issues for this high-risk pregnancy and has PCOS (polycystic ovarian syndrome); Insulin resistance; History of repair of coarctation of aorta; Supervision of high risk pregnancy, antepartum; History of gestational diabetes in prior pregnancy, currently pregnant; Language barrier; COVID-19 virus infection; and Normal labor on their problem list.  Patient reports no complaints.  Contractions: Not present. Vag. Bleeding: None.  Movement: Present. Denies leaking of fluid.   The following portions of the patient's history were reviewed and updated as appropriate: allergies, current medications, past family history, past medical history, past social history, past surgical history and problem list. Problem list updated.  Objective:   Vitals:   04/11/21 1413  BP: 111/72  Pulse: 62  Weight: 185 lb (83.9 kg)    Fetal Status: Fetal Heart Rate (bpm): 146   Movement: Present     General:  Alert, oriented and cooperative. Patient is in no acute distress.  Skin: Skin is warm and dry. No rash noted.   Cardiovascular: Normal heart rate noted  Respiratory: Normal respiratory effort, no problems with respiration noted  Abdomen: Soft, gravid, appropriate for gestational age. Pain/Pressure: Present     Pelvic: Vag. Bleeding: None     Cervical exam deferred        Extremities: Normal range of motion.  Edema: None  Mental Status: Normal mood and affect. Normal behavior. Normal judgment and thought content.   Urinalysis:      Assessment and Plan:  Pregnancy: G3P2002 at [redacted]w[redacted]d  1. Supervision of high risk pregnancy, antepartum BP and FHR normal Transfer from [facility] Dating based on [redacted]w[redacted]d [place] (LMP was measuring 7wks ahead) Reports genetic testing was sent last week AFP today All other testing up to date Will order [facility] anatomy scan  today  2. Language barrier Spanish  3. 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 Servando Salina if this should be repeated Fetal echo referral placed  4. History of gestational diabetes in prior pregnancy, currently pregnant Early 1hr GTT was normal Start ASA today  Preterm labor symptoms and general obstetric precautions including but not limited to vaginal bleeding, contractions, leaking of fluid and fetal movement were reviewed in detail with the patient. Please refer to After Visit Summary for other counseling recommendations.  Return in 4 weeks (on 05/09/2021) for [facility], ob visit.   Venora Maples, MD

## 2021-04-12 ENCOUNTER — Telehealth: Payer: Self-pay

## 2021-04-12 NOTE — Telephone Encounter (Signed)
Called [facility] to schedule [facility] U/S, was advised no more appts . Available in June & July Calendar is not available yet. Will call back at end of Month to schedule.

## 2021-04-13 LAB — AFP, SERUM, OPEN SPINA BIFIDA
AFP MoM: 0.47
AFP Value: 15.2 ng/mL
Gest. Age on Collection Date: 16.4 weeks
Maternal Age At EDD: 31.5 yr
OSBR Risk 1 IN: 10000
Test Results:: NEGATIVE
Weight: 185 [lb_av]

## 2021-04-27 ENCOUNTER — Telehealth: Payer: Self-pay

## 2021-04-27 NOTE — Telephone Encounter (Signed)
Called Pt using [employer] [place] id # [phone] to advise of U/S on 05/17/21@ 9;15a.No answer, so left VM on her Mothers VM phone# ending [phone].

## 2021-05-09 ENCOUNTER — Other Ambulatory Visit: Payer: Self-pay

## 2021-05-09 ENCOUNTER — Ambulatory Visit (INDEPENDENT_AMBULATORY_CARE_PROVIDER_SITE_OTHER): Payer: Self-pay | Admitting: Family Medicine

## 2021-05-09 VITALS — BP 127/74 | HR 79 | Wt 190.0 lb

## 2021-05-09 DIAGNOSIS — O0990 Supervision of high risk pregnancy, unspecified, unspecified trimester: Secondary | ICD-10-CM

## 2021-05-09 DIAGNOSIS — Z8774 Personal history of (corrected) congenital malformations of heart and circulatory system: Secondary | ICD-10-CM

## 2021-05-09 DIAGNOSIS — Z789 Other specified health status: Secondary | ICD-10-CM

## 2021-05-09 DIAGNOSIS — N3000 Acute cystitis without hematuria: Secondary | ICD-10-CM

## 2021-05-09 DIAGNOSIS — Z8632 Personal history of gestational diabetes: Secondary | ICD-10-CM

## 2021-05-09 DIAGNOSIS — O09299 Supervision of pregnancy with other poor reproductive or obstetric history, unspecified trimester: Secondary | ICD-10-CM

## 2021-05-09 LAB — POCT URINALYSIS DIP (DEVICE)
Bilirubin Urine: NEGATIVE
Glucose, UA: NEGATIVE mg/dL
Nitrite: POSITIVE — AB
Protein, ur: 30 mg/dL — AB
Specific Gravity, Urine: 1.025 (ref 1.005–1.030)
Urobilinogen, UA: 0.2 mg/dL (ref 0.0–1.0)
pH: 5.5 (ref 5.0–8.0)

## 2021-05-09 MED ORDER — CEFADROXIL 500 MG PO CAPS
500.0000 mg | ORAL_CAPSULE | Freq: Two times a day (BID) | ORAL | 0 refills | Status: DC
  Filled 2021-05-09: qty 14, 7d supply, fill #0

## 2021-05-09 NOTE — Patient Instructions (Addendum)
Fetal Echo appt on 8/24@ 1045 am.  Located in: [facility] Address: [address], [place], [place] [address] Phone: [phone]  [facility] Ultrasound appt on 7/21 at 915 am  Address: [address] [place], [place] [address] Phone: [phone]

## 2021-05-09 NOTE — Progress Notes (Signed)
Pt states having urinary burning x 2 days last week. Will check UA today in office.  UA today: Mod Leuks, positive Nitrates.   Pt states did not know about the Fetal Echo appt but has rescheduled for 8/24@ 1045amPt aware now of appt.

## 2021-05-09 NOTE — Progress Notes (Signed)
   PRENATAL VISIT NOTE  Subjective:  Kimberly Reid is a 30 y.o. G3P2002 at [redacted]w[redacted]d being seen today for ongoing prenatal care.  She is currently monitored for the following issues for this high-risk pregnancy and has PCOS (polycystic ovarian syndrome); Insulin resistance; History of repair of coarctation of aorta; Supervision of high risk pregnancy, antepartum; History of gestational diabetes in prior pregnancy, currently pregnant; Language barrier; COVID-19 virus infection; and Normal labor on their problem list.  Patient reports no complaints.  Contractions: Not present. Vag. Bleeding: None.  Movement: Present. Denies leaking of fluid.   The following portions of the patient's history were reviewed and updated as appropriate: allergies, current medications, past family history, past medical history, past social history, past surgical history and problem list.   Objective:   Vitals:   05/09/21 1357  BP: 127/74  Pulse: 79  Weight: 190 lb (86.2 kg)    Fetal Status: Fetal Heart Rate (bpm): 150   Movement: Present     General:  Alert, oriented and cooperative. Patient is in no acute distress.  Skin: Skin is warm and dry. No rash noted.   Cardiovascular: Normal heart rate noted  Respiratory: Normal respiratory effort, no problems with respiration noted  Abdomen: Soft, gravid, appropriate for gestational age.  Pain/Pressure: Absent     Pelvic: Cervical exam deferred        Extremities: Normal range of motion.  Edema: None  Mental Status: Normal mood and affect. Normal behavior. Normal judgment and thought content.   Assessment and Plan:  Pregnancy: G3P2002 at [redacted]w[redacted]d 1. Supervision of high risk pregnancy, antepartum FHT and FH normal  2. Acute cystitis without hematuria Duricef 500mg  BID x 7 days.  3. Language barrier Interpreter used  4. History of gestational diabetes in prior pregnancy, currently pregnant Early 1hr GTT  5. History of repair of coarctation of  aorta Scheduled for fetal echo 8/24 Scheduled with Dr 9/24 on 8/5. Echo ordered Asymptomic  Preterm labor symptoms and general obstetric precautions including but not limited to vaginal bleeding, contractions, leaking of fluid and fetal movement were reviewed in detail with the patient. Please refer to After Visit Summary for other counseling recommendations.   No follow-ups on file.  Future Appointments  Date Time Provider Department Center  06/01/2021  3:40 PM [place], 08/01/2021, DO [facility] None    Lavona Mound, DO

## 2021-05-11 ENCOUNTER — Other Ambulatory Visit: Payer: Self-pay

## 2021-05-31 ENCOUNTER — Encounter: Payer: Self-pay | Admitting: *Deleted

## 2021-06-01 ENCOUNTER — Ambulatory Visit: Payer: Self-pay | Admitting: Cardiology

## 2021-06-06 ENCOUNTER — Other Ambulatory Visit: Payer: Self-pay

## 2021-06-06 ENCOUNTER — Ambulatory Visit (INDEPENDENT_AMBULATORY_CARE_PROVIDER_SITE_OTHER): Payer: Self-pay | Admitting: Family Medicine

## 2021-06-06 ENCOUNTER — Encounter: Payer: Self-pay | Admitting: Family Medicine

## 2021-06-06 VITALS — BP 123/67 | HR 76 | Wt 196.0 lb

## 2021-06-06 DIAGNOSIS — O0990 Supervision of high risk pregnancy, unspecified, unspecified trimester: Secondary | ICD-10-CM

## 2021-06-06 DIAGNOSIS — Z789 Other specified health status: Secondary | ICD-10-CM

## 2021-06-06 DIAGNOSIS — O09299 Supervision of pregnancy with other poor reproductive or obstetric history, unspecified trimester: Secondary | ICD-10-CM

## 2021-06-06 DIAGNOSIS — Z8632 Personal history of gestational diabetes: Secondary | ICD-10-CM

## 2021-06-06 DIAGNOSIS — Z8774 Personal history of (corrected) congenital malformations of heart and circulatory system: Secondary | ICD-10-CM

## 2021-06-06 NOTE — Patient Instructions (Signed)

## 2021-06-06 NOTE — Progress Notes (Signed)
   Subjective:  Kimberly Reid is a 30 y.o. G3P2002 at [redacted]w[redacted]d being seen today for ongoing prenatal care.  She is currently monitored for the following issues for this high-risk pregnancy and has PCOS (polycystic ovarian syndrome); Insulin resistance; History of repair of coarctation of aorta; Supervision of high risk pregnancy, antepartum; History of gestational diabetes in prior pregnancy, currently pregnant; Language barrier; COVID-19 virus infection; and Normal labor on their problem list.  Patient reports no complaints.  Contractions: Not present. Vag. Bleeding: None.  Movement: Present. Denies leaking of fluid.   The following portions of the patient's history were reviewed and updated as appropriate: allergies, current medications, past family history, past medical history, past social history, past surgical history and problem list. Problem list updated.  Objective:   Vitals:   06/06/21 1344  BP: 123/67  Pulse: 76  Weight: 196 lb (88.9 kg)    Fetal Status: Fetal Heart Rate (bpm): 156 Fundal Height: 25 cm Movement: Present     General:  Alert, oriented and cooperative. Patient is in no acute distress.  Skin: Skin is warm and dry. No rash noted.   Cardiovascular: Normal heart rate noted  Respiratory: Normal respiratory effort, no problems with respiration noted  Abdomen: Soft, gravid, appropriate for gestational age. Pain/Pressure: Present     Pelvic: Vag. Bleeding: None     Cervical exam deferred        Extremities: Normal range of motion.  Edema: None  Mental Status: Normal mood and affect. Normal behavior. Normal judgment and thought content.   Urinalysis:      Assessment and Plan:  Pregnancy: G3P2002 at [redacted]w[redacted]d  1. Supervision of high risk pregnancy, antepartum BP and FHR normal Anatomy scan unremarkable last month Clinical diagnosis of UTI last visit, took full course of abx, TOC today  2. 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 History of gestational diabetes in prior pregnancy, currently pregnant Early 1hr normal 2hr GTT next visit  4. Language barrier Spanish  Preterm labor symptoms and general obstetric precautions including but not limited to vaginal bleeding, contractions, leaking of fluid and fetal movement were reviewed in detail with the patient. Please refer to After Visit Summary for other counseling recommendations.  Return in 4 weeks (on 07/04/2021) for [facility], ob visit.   Venora Maples, MD

## 2021-06-08 LAB — URINE CULTURE, OB REFLEX: Organism ID, Bacteria: NO GROWTH

## 2021-06-08 LAB — CULTURE, OB URINE

## 2021-06-14 DIAGNOSIS — Q249 Congenital malformation of heart, unspecified: Secondary | ICD-10-CM | POA: Insufficient documentation

## 2021-06-14 DIAGNOSIS — R011 Cardiac murmur, unspecified: Secondary | ICD-10-CM | POA: Insufficient documentation

## 2021-06-15 ENCOUNTER — Ambulatory Visit (INDEPENDENT_AMBULATORY_CARE_PROVIDER_SITE_OTHER): Payer: Self-pay | Admitting: Cardiology

## 2021-06-15 ENCOUNTER — Other Ambulatory Visit: Payer: Self-pay

## 2021-06-15 ENCOUNTER — Encounter: Payer: Self-pay | Admitting: Cardiology

## 2021-06-15 VITALS — BP 114/70 | HR 73 | Ht 63.0 in | Wt 199.0 lb

## 2021-06-15 DIAGNOSIS — E282 Polycystic ovarian syndrome: Secondary | ICD-10-CM

## 2021-06-15 DIAGNOSIS — Q249 Congenital malformation of heart, unspecified: Secondary | ICD-10-CM

## 2021-06-15 DIAGNOSIS — E8881 Metabolic syndrome: Secondary | ICD-10-CM

## 2021-06-15 DIAGNOSIS — O09299 Supervision of pregnancy with other poor reproductive or obstetric history, unspecified trimester: Secondary | ICD-10-CM

## 2021-06-15 DIAGNOSIS — O0990 Supervision of high risk pregnancy, unspecified, unspecified trimester: Secondary | ICD-10-CM

## 2021-06-15 DIAGNOSIS — Q251 Coarctation of aorta: Secondary | ICD-10-CM

## 2021-06-15 DIAGNOSIS — Z8632 Personal history of gestational diabetes: Secondary | ICD-10-CM

## 2021-06-15 DIAGNOSIS — Z8774 Personal history of (corrected) congenital malformations of heart and circulatory system: Secondary | ICD-10-CM

## 2021-06-15 NOTE — Patient Instructions (Signed)
Medication Instructions:  Your physician recommends that you continue on your current medications as directed. Please refer to the Current Medication list given to you today.  *If you need a refill on your cardiac medications before your next appointment, please call your pharmacy*   Lab Work: None If you have labs (blood work) drawn today and your tests are completely normal, you will receive your results only by: MyChart Message (if you have MyChart) OR A paper copy in the mail If you have any lab test that is abnormal or we need to change your treatment, we will call you to review the results.   Testing/Procedures: Your physician has requested that you have an echocardiogram. Echocardiography is a painless test that uses sound waves to create images of your heart. It provides your doctor with information about the size and shape of your heart and how well your heart's chambers and valves are working. This procedure takes approximately one hour. There are no restrictions for this procedure.    Follow-Up: At [facility], you and your health needs are our priority.  As part of our continuing mission to provide you with exceptional heart care, we have created designated Provider Care Teams.  These Care Teams include your primary Cardiologist (physician) and Advanced Practice Providers (APPs -  Physician Assistants and Nurse Practitioners) who all work together to provide you with the care you need, when you need it.  We recommend signing up for the patient portal called "MyChart".  Sign up information is provided on this After Visit Summary.  MyChart is used to connect with patients for Virtual Visits (Telemedicine).  Patients are able to view lab/test results, encounter notes, upcoming appointments, etc.  Non-urgent messages can be sent to your provider as well.   To learn more about what you can do with MyChart, go to [email or web address].    Your next appointment:   12  week(s)  The format for your next appointment:   In Person  Provider:   Kardie Tobb, DO   Other Instructions Echocardiogram An echocardiogram is a test that uses sound waves (ultrasound) to produce images of the heart. Images from an echocardiogram can provide important information about: Heart size and shape. The size and thickness and movement of your heart's walls. Heart muscle function and strength. Heart valve function or if you have stenosis. Stenosis is when the heart valves are too narrow. If blood is flowing backward through the heart valves (regurgitation). A tumor or infectious growth around the heart valves. Areas of heart muscle that are not working well because of poor blood flow or injury from a heart attack. Aneurysm detection. An aneurysm is a weak or damaged part of an artery wall. The wall bulges out from the normal force of blood pumping through the body. Tell a health care provider about: Any allergies you have. All medicines you are taking, including vitamins, herbs, eye drops, creams, and over-the-counter medicines. Any blood disorders you have. Any surgeries you have had. Any medical conditions you have. Whether you are pregnant or may be pregnant. What are the risks? Generally, this is a safe test. However, problems may occur, including an allergic reaction to dye (contrast) that may be used during the test. What happens before the test? No specific preparation is needed. You may eat and drink normally. What happens during the test?  You will take off your clothes from the waist up and put on a hospital gown. Electrodes or electrocardiogram (ECG)patches may be placed on   your chest. The electrodes or patches are then connected to a device that monitors your heart rate and rhythm. You will lie down on a table for an ultrasound exam. A gel will be applied to your chest to help sound waves pass through your skin. A handheld device, called a transducer, will  be pressed against your chest and moved over your heart. The transducer produces sound waves that travel to your heart and bounce back (or "echo" back) to the transducer. These sound waves will be captured in real-time and changed into images of your heart that can be viewed on a video monitor. The images will be recorded on a computer and reviewed by your health care provider. You may be asked to change positions or hold your breath for a short time. This makes it easier to get different views or better views of your heart. In some cases, you may receive contrast through an IV in one of your veins. This can improve the quality of the pictures from your heart. The procedure may vary among health care providers and hospitals. What can I expect after the test? You may return to your normal, everyday life, including diet, activities, andmedicines, unless your health care provider tells you not to do that. Follow these instructions at home: It is up to you to get the results of your test. Ask your health care provider, or the department that is doing the test, when your results will be ready. Keep all follow-up visits. This is important. Summary An echocardiogram is a test that uses sound waves (ultrasound) to produce images of the heart. Images from an echocardiogram can provide important information about the size and shape of your heart, heart muscle function, heart valve function, and other possible heart problems. You do not need to do anything to prepare before this test. You may eat and drink normally. After the echocardiogram is completed, you may return to your normal, everyday life, unless your health care provider tells you not to do that. This information is not intended to replace advice given to you by your health care provider. Make sure you discuss any questions you have with your healthcare provider. Document Revised: 06/06/2020 Document Reviewed: 06/06/2020 Elsevier Patient Education   2022 Elsevier Inc.   

## 2021-06-15 NOTE — Progress Notes (Signed)
Cardio-Obstetrics Clinic  New Evaluation  Date:  06/16/2021   ID:  Kimberly Reid, Kimberly Reid 1991/08/21, MRN [id number]  PCP:  Claiborne Rigg, NP   [facility] Providers Cardiologist:  Thomasene Ripple, DO  Electrophysiologist:  None       Referring MD: Venora Maples, MD   Chief Complaint: " I have a history of heart problems as a child which was repaired"  History of Present Illness:    Kimberly Reid is a 30 y.o. female [G3P2002] who is being seen today for the evaluation due to history of repaired coarctation of the aorta  at the request of Venora Maples, MD.   The patient is [redacted] weeks pregnant and has a medical history of polycystic ovarian syndrome, gestational diabetes, coarctation of the aorta which was repaired when she was 59 months old in [place] and had not had any problems since.  Since that time in adulthood she is done well.  She has had 2 vaginal deliveries, and back in 2020 was evaluated by cardiology during her pregnancy as well.  During that time she had echocardiogram performed.  Postpartum she did not follow-up for subsequent testing.  She has been experiencing some shortness of breath but no significant chest pain.  Prior CV Studies Reviewed: The following studies were reviewed today:  TTE 12/2018  1. The left ventricle has normal systolic function, with an ejection  fraction of 55-60%. The cavity size was mildly dilated. Mild basal  hypertrophy. Left ventricular diastolic parameters were normal Elevated  left ventricular end-diastolic pressure.   2. The right ventricle has normal systolic function. The cavity was  normal. There is no increase in right ventricular wall thickness.   3. The mitral valve is normal in structure. There is moderate mitral  annular calcification present.   4. The tricuspid valve is normal in structure.   5. Cannot rule out bicuspid aortic valve Moderate sclerosis of the aortic  valve.   6. The pulmonic valve was  normal in structure.   7. 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 There is a residual  gradient in the descending aorta with a peak velocity of 294cm/s and peak  gradient of and mean gradient .   8. Recommend dedicated cardiac MRI/MRA once patient has delivered her  baby to better assess AV and descending aorta.   FINDINGS   Left Ventricle: The left ventricle has normal systolic function, with an  ejection fraction of 55-60%. The cavity size was mildly dilated. Mild  basal hypertrophy. Left ventricular diastolic parameters were normal  Elevated left ventricular end-diastolic  pressure  Right Ventricle: The right ventricle has normal systolic function. The  cavity was normal. There is no increase in right ventricular wall  thickness.  Left Atrium: left atrial size was normal in size  Right Atrium: right atrial size was normal in size. Right atrial pressure  is estimated at 3 mmHg.  Interatrial Septum: No atrial level shunt detected by color flow Doppler.  Pericardium: There is no evidence of pericardial effusion.  Mitral Valve: The mitral valve is normal in structure. There is moderate  mitral annular calcification present. Mitral valve regurgitation is  trivial by color flow Doppler.  Tricuspid Valve: The tricuspid valve is normal in structure. Tricuspid  valve regurgitation is mild by color flow Doppler.  Aortic Valve: Cannot rule out bicuspid aortic valve Moderate sclerosis of  the aortic valve. Aortic valve regurgitation was not visualized by color  flow Doppler.  Pulmonic Valve: The pulmonic valve was normal in structure. Pulmonic valve  regurgitation is not visualized by color flow Doppler.  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 There is a residual gradient in the descending aorta with a peak velocity of 294cm/s and peak gradient of and mean gradient .  Venous: The inferior vena cava is normal in size with greater than 50%  respiratory variability.   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   Prenatal Vit-Fe Fumarate-FA (MULTIVITAMIN-PRENATAL) 27-0.8 MG TABS tablet Take 1 tablet by mouth daily at 12 noon.     Allergies:   Patient has no known allergies.   Social History   Socioeconomic History   Marital status: Married    Spouse name: Not on file   Number of children: 1   Years of education: Not on file   Highest education level: Not on file  Occupational History   Not on file  Tobacco Use   Smoking status: Never   Smokeless tobacco: Never  Vaping Use   Vaping Use: Never used  Substance and Sexual Activity   Alcohol use: Not Currently   Drug use: No   Sexual activity: Yes    Birth control/protection: None  Other Topics Concern   Not on file  Social History Narrative   Not on file   Social Determinants of Health   Financial Resource Strain: Not on file  Food Insecurity: No Food Insecurity   Worried About Running Out of Food in the Last Year: Never true   Ran Out of Food in the Last Year: Never true  Transportation Needs: No Transportation Needs   Lack of Transportation (Medical): No   Lack of Transportation (Non-Medical): No  Physical Activity: Not on file  Stress: Not on file  Social Connections: Not on file       Family History  Problem Relation Age of Onset   Diabetes Mother    Healthy Father    Congenital Murmur Sister       ROS:   Please see the history of present illness.     Review of Systems  Constitution: Negative for decreased appetite, fever and weight gain.  HENT: Negative for congestion, ear discharge, hoarse voice and sore throat.   Eyes: Negative for discharge, redness, vision loss in right eye and visual halos.  Cardiovascular: Very mild dyspnea on exertion.  Negative for chest pain,  leg swelling, orthopnea and palpitations.  Respiratory:  Negative for cough, hemoptysis, shortness of breath and snoring.   Endocrine: Negative for heat intolerance and polyphagia.  Hematologic/Lymphatic: Negative for bleeding problem. Does not bruise/bleed easily.  Skin: Negative for flushing, nail changes, rash and suspicious lesions.  Musculoskeletal: Negative for arthritis, joint pain, muscle cramps, myalgias, neck pain and stiffness.  Gastrointestinal: Negative for abdominal pain, bowel incontinence, diarrhea and excessive appetite.  Genitourinary: Negative for decreased libido, genital sores and incomplete emptying.  Neurological: Negative for brief paralysis, focal weakness, headaches and loss of balance.  Psychiatric/Behavioral: Negative for altered mental status, depression and suicidal ideas.  Allergic/Immunologic: Negative for HIV exposure and persistent infections.    Labs/EKG Reviewed:    EKG:   EKG is was ordered today.  The ekg ordered today demonstrates sinus rhythm, 70 bpm with incomplete right bundle branch block.  Recent Labs: 02/16/2021: Hemoglobin 12.7; Platelets 198   Recent Lipid Panel Lab Results  Component Value Date/Time   CHOL 148 01/26/2018 12:16 PM   TRIG 114 01/26/2018 12:16 PM   HDL 44 01/26/2018 12:16 PM   CHOLHDL 3.4 01/26/2018 12:16 PM   LDLCALC 81 01/26/2018 12:16 PM    Physical Exam:    VS:  BP 114/70 (BP Location: Right Arm, Patient Position:  Sitting, Cuff Size: Normal)   Pulse 73   Ht 5\' 3"  (1.6 m)   Wt 199 lb (90.3 kg)   LMP 10/30/2020 (Exact Date)   SpO2 96%   BMI 35.25 kg/m     Wt Readings from Last 3 Encounters:  06/15/21 199 lb (90.3 kg)  06/06/21 196 lb (88.9 kg)  05/09/21 190 lb (86.2 kg)     GEN:  Well nourished, well developed in no acute distress HEENT: Normal NECK: No JVD; No carotid bruits LYMPHATICS: No lymphadenopathy CARDIAC: RRR, diffuse 3 out of 6 systolic murmurs, rubs, gallops RESPIRATORY:  Clear to auscultation without rales, wheezing or rhonchi  ABDOMEN: Soft, non-tender, non-distended MUSCULOSKELETAL:  No edema; No deformity  SKIN: Warm and dry NEUROLOGIC:  Alert and oriented x 3 PSYCHIATRIC:  Normal affect    Risk Assessment/Risk Calculators:     CARPREG II Risk Prediction Index Score:  0.  The patient's risk for a primary cardiac event is 5%.            ASSESSMENT & PLAN:    History of coarctation of aorta Shortness of breath History of gestational diabetes  We will repeat an echocardiogram chest and assess the gradients especially in the setting of her minimal shortness of breath.  At this point I do not anticipate any significant issues as I do believe that she will be able to tolerate vaginal delivery.  She has had 2 vaginal deliveries with no issues.  We also talked about other cardiovascular future risk factors as she does have gestational diabetes.  Her gestational diabetes is being managed by the [facility] team.    Patient Instructions  Medication Instructions:  Your physician recommends that you continue on your current medications as directed. Please refer to the Current Medication list given to you today.  *If you need a refill on your cardiac medications before your next appointment, please call your pharmacy*   Lab Work: None If you have labs (blood work) drawn today and your tests are completely normal, you will receive your results only by: MyChart Message (if you  have MyChart) OR A paper copy in the mail If you have any lab test that is abnormal or we need to change your treatment, we will  call you to review the results.   Testing/Procedures: Your physician has requested that you have an echocardiogram. Echocardiography is a painless test that uses sound waves to create images of your heart. It provides your doctor with information about the size and shape of your heart and how well your heart's chambers and valves are working. This procedure takes approximately one hour. There are no restrictions for this procedure.    Follow-Up: [facility], you and your health needs are our priority.  As part of our continuing mission to provide you with exceptional heart care, we have created designated Provider Care Teams.  These Care Teams include your primary Cardiologist (physician) and Advanced Practice Providers (APPs -  Physician Assistants and Nurse Practitioners) who all work together to provide you with the care you need, when you need it.  We recommend signing up for the patient portal called "MyChart".  Sign up information is provided on this After Visit Summary.  MyChart is used to connect with patients for Virtual Visits (Telemedicine).  Patients are able to view lab/test results, encounter notes, upcoming appointments, etc.  Non-urgent messages can be sent to your provider as well.   To learn more about what you can do with MyChart, go to [email or web address].    Your next appointment:   12 week(s)  The format for your next appointment:   In Person  Provider:   Thomasene RippleKardie Anisa Leanos, DO   Other Instructions Echocardiogram An echocardiogram is a test that uses sound waves (ultrasound) to produce images of the heart. Images from an echocardiogram can provide important information about: Heart size and shape. The size and thickness and movement of your heart's walls. Heart muscle function and strength. Heart valve function or if you have  stenosis. Stenosis is when the heart valves are too narrow. If blood is flowing backward through the heart valves (regurgitation). A tumor or infectious growth around the heart valves. Areas of heart muscle that are not working well because of poor blood flow or injury from a heart attack. Aneurysm detection. An aneurysm is a weak or damaged part of an artery wall. The wall bulges out from the normal force of blood pumping through the body. Tell a health care provider about: Any allergies you have. All medicines you are taking, including vitamins, herbs, eye drops, creams, and over-the-counter medicines. Any blood disorders you have. Any surgeries you have had. Any medical conditions you have. Whether you are pregnant or may be pregnant. What are the risks? Generally, this is a safe test. However, problems may occur, including an allergic reaction to dye (contrast) that may be used during the test. What happens before the test? No specific preparation is needed. You may eat and drink normally. What happens during the test?  You will take off your clothes from the waist up and put on a hospital gown. Electrodes or electrocardiogram (ECG)patches may be placed on your chest. The electrodes or patches are then connected to a device that monitors your heart rate and rhythm. You will lie down on a table for an ultrasound exam. A gel will be applied to your chest to help sound waves pass through your skin. A handheld device, called a transducer, will be pressed against your chest and moved over your heart. The transducer produces sound waves that travel to your heart and bounce back (or "echo" back) to the transducer. These sound waves will be captured in real-time and changed into images of your heart that can be viewed  on a video monitor. The images will be recorded on a computer and reviewed by your health care provider. You may be asked to change positions or hold your breath for a short time.  This makes it easier to get different views or better views of your heart. In some cases, you may receive contrast through an IV in one of your veins. This can improve the quality of the pictures from your heart. The procedure may vary among health care providers and hospitals. What can I expect after the test? You may return to your normal, everyday life, including diet, activities, andmedicines, unless your health care provider tells you not to do that. Follow these instructions at home: It is up to you to get the results of your test. Ask your health care provider, or the department that is doing the test, when your results will be ready. Keep all follow-up visits. This is important. Summary An echocardiogram is a test that uses sound waves (ultrasound) to produce images of the heart. Images from an echocardiogram can provide important information about the size and shape of your heart, heart muscle function, heart valve function, and other possible heart problems. You do not need to do anything to prepare before this test. You may eat and drink normally. After the echocardiogram is completed, you may return to your normal, everyday life, unless your health care provider tells you not to do that. This information is not intended to replace advice given to you by your health care provider. Make sure you discuss any questions you have with your healthcare provider. Document Revised: 06/06/2020 Document Reviewed: 06/06/2020 Elsevier Patient Education  2022 Elsevier Inc.    Dispo:  No follow-ups on file.   Medication Adjustments/Labs and Tests Ordered: Current medicines are reviewed at length with the patient today.  Concerns regarding medicines are outlined above.  Tests Ordered: Orders Placed This Encounter  Procedures   EKG 12-Lead   ECHOCARDIOGRAM COMPLETE   Medication Changes: No orders of the defined types were placed in this encounter.

## 2021-06-19 ENCOUNTER — Ambulatory Visit (HOSPITAL_COMMUNITY): Payer: Self-pay | Attending: Cardiology

## 2021-06-19 ENCOUNTER — Other Ambulatory Visit: Payer: Self-pay

## 2021-06-19 DIAGNOSIS — Q251 Coarctation of aorta: Secondary | ICD-10-CM | POA: Insufficient documentation

## 2021-06-19 LAB — ECHOCARDIOGRAM COMPLETE
AR max vel: 2.18 cm2
AV Area VTI: 2.16 cm2
AV Area mean vel: 2.16 cm2
AV Mean grad: 10 mmHg
AV Peak grad: 19.2 mmHg
Ao pk vel: 2.19 m/s
Area-P 1/2: 2.59 cm2
S' Lateral: 2.5 cm

## 2021-06-22 ENCOUNTER — Telehealth: Payer: Self-pay

## 2021-06-22 NOTE — Telephone Encounter (Signed)
-----   Message from Thomasene Ripple, DO sent at 06/19/2021  9:39 PM EDT ----- Your echo shows normal ejection fraction. There are no increase in pressure across the area of your repair coarctation of the aorta. We will continue to follow you.

## 2021-06-22 NOTE — Telephone Encounter (Signed)
Patient notified of results.

## 2021-07-05 ENCOUNTER — Other Ambulatory Visit: Payer: Self-pay

## 2021-07-05 ENCOUNTER — Ambulatory Visit (INDEPENDENT_AMBULATORY_CARE_PROVIDER_SITE_OTHER): Payer: Self-pay | Admitting: Obstetrics and Gynecology

## 2021-07-05 ENCOUNTER — Encounter: Payer: Self-pay | Admitting: Obstetrics and Gynecology

## 2021-07-05 ENCOUNTER — Other Ambulatory Visit: Payer: Self-pay | Admitting: General Practice

## 2021-07-05 VITALS — BP 107/62 | HR 62 | Wt 199.7 lb

## 2021-07-05 DIAGNOSIS — Z8774 Personal history of (corrected) congenital malformations of heart and circulatory system: Secondary | ICD-10-CM

## 2021-07-05 DIAGNOSIS — O3503X0 Maternal care for (suspected) central nervous system malformation or damage in fetus, choroid plexus cysts, not applicable or unspecified: Secondary | ICD-10-CM | POA: Insufficient documentation

## 2021-07-05 DIAGNOSIS — O0990 Supervision of high risk pregnancy, unspecified, unspecified trimester: Secondary | ICD-10-CM

## 2021-07-05 DIAGNOSIS — O350XX0 Maternal care for (suspected) central nervous system malformation in fetus, not applicable or unspecified: Secondary | ICD-10-CM | POA: Insufficient documentation

## 2021-07-05 DIAGNOSIS — O99210 Obesity complicating pregnancy, unspecified trimester: Secondary | ICD-10-CM

## 2021-07-05 DIAGNOSIS — Z789 Other specified health status: Secondary | ICD-10-CM

## 2021-07-05 DIAGNOSIS — Z6835 Body mass index (BMI) 35.0-35.9, adult: Secondary | ICD-10-CM

## 2021-07-05 DIAGNOSIS — Z603 Acculturation difficulty: Secondary | ICD-10-CM

## 2021-07-05 DIAGNOSIS — Q249 Congenital malformation of heart, unspecified: Secondary | ICD-10-CM

## 2021-07-05 NOTE — Patient Instructions (Signed)
Choroid plexus cyst

## 2021-07-05 NOTE — Progress Notes (Signed)
    PRENATAL VISIT NOTE  Subjective:  Kimberly Reid is a 30 y.o. G3P2002 at [redacted]w[redacted]d being seen today for ongoing prenatal care.  She is currently monitored for the following issues for this high-risk pregnancy and has PCOS (polycystic ovarian syndrome); Insulin resistance; History of repair of coarctation of aorta; Supervision of high risk pregnancy, antepartum; History of gestational diabetes in prior pregnancy, currently pregnant; Language barrier; Congenital heart anomaly; Heart murmur; Obesity in pregnancy; BMI 35.0-35.9,adult; and Choroid plexus cysts, fetal, affecting care of mother, antepartum on their problem list.  Patient reports no complaints.  Contractions: Not present. Vag. Bleeding: None.  Movement: Present. Denies leaking of fluid.   The following portions of the patient's history were reviewed and updated as appropriate: allergies, current medications, past family history, past medical history, past social history, past surgical history and problem list.   Objective:   Vitals:   07/05/21 1020 07/05/21 1039  BP: (!) 116/56 107/62  Pulse: (!) 57 62  Weight: 199 lb 11.2 oz (90.6 kg)     Fetal Status: Fetal Heart Rate (bpm): 134   Movement: Present     General:  Alert, oriented and cooperative. Patient is in no acute distress.  Skin: Skin is warm and dry. No rash noted.   Cardiovascular: Normal heart rate noted  Respiratory: Normal respiratory effort, no problems with respiration noted  Abdomen: Soft, gravid, appropriate for gestational age.  Pain/Pressure: Absent     Pelvic: Cervical exam deferred        Extremities: Normal range of motion.  Edema: None  Mental Status: Normal mood and affect. Normal behavior. Normal judgment and thought content.   Assessment and Plan:  Pregnancy: G3P2002 at [redacted]w[redacted]d 1. Supervision of high risk pregnancy, antepartum 28wk labs today  2. BMI 35.0-35.9,adult  3. Obesity in pregnancy  4. History of repair of coarctation of  aorta Negative fetal echo and maternal echo in august. No need for f/u with peds cards. Pt has f/u with OB cards in november  5. Congenital heart anomaly  6. Language barrier Pt speaks english  7. Choroid plexus cyst of fetus affecting care of mother, antepartum, single or unspecified fetus D/w her that [facility] anatomy u/s showed a 16mm CP cyst (laterality not specified).  She states that she had genetic testing at the [facility] center that told her the sex but I don't see anything in lab or media so RNs to try and get results. I told her I recommend an mfm u/s as well but likely is an incidental finding  AFP was negative - [place] MFM OB DETAIL +14 WK; Future  Preterm labor symptoms and general obstetric precautions including but not limited to vaginal bleeding, contractions, leaking of fluid and fetal movement were reviewed in detail with the patient. Please refer to After Visit Summary for other counseling recommendations.   Return in about 2 weeks (around 07/19/2021) for in person, md visit, high risk ob.  Future Appointments  Date Time Provider Department Center  07/19/2021 10:35 AM Hermina Staggers, MD [facility]  07/23/2021  7:30 AM [facility] NURSE [facility]  07/23/2021  7:45 AM [facility] US6 [facility]  09/07/2021  2:00 PM Tobb, Lavona Mound, DO [facility] None    [place] Bing, MD

## 2021-07-06 LAB — GLUCOSE TOLERANCE, 2 HOURS W/ 1HR
Glucose, 1 hour: 105 mg/dL (ref 65–179)
Glucose, 2 hour: 120 mg/dL (ref 65–152)
Glucose, Fasting: 76 mg/dL (ref 65–91)

## 2021-07-06 LAB — CBC
Hematocrit: 37 % (ref 34.0–46.6)
Hemoglobin: 12 g/dL (ref 11.1–15.9)
MCH: 29 pg (ref 26.6–33.0)
MCHC: 32.4 g/dL (ref 31.5–35.7)
MCV: 89 fL (ref 79–97)
Platelets: 197 10*3/uL (ref 150–450)
RBC: 4.14 x10E6/uL (ref 3.77–5.28)
RDW: 12.5 % (ref 11.7–15.4)
WBC: 7.4 10*3/uL (ref 3.4–10.8)

## 2021-07-06 LAB — HIV ANTIBODY (ROUTINE TESTING W REFLEX): HIV Screen 4th Generation wRfx: NONREACTIVE

## 2021-07-06 LAB — RPR: RPR Ser Ql: NONREACTIVE

## 2021-07-19 ENCOUNTER — Ambulatory Visit (INDEPENDENT_AMBULATORY_CARE_PROVIDER_SITE_OTHER): Payer: Self-pay | Admitting: Obstetrics and Gynecology

## 2021-07-19 ENCOUNTER — Encounter: Payer: Self-pay | Admitting: Obstetrics and Gynecology

## 2021-07-19 ENCOUNTER — Other Ambulatory Visit: Payer: Self-pay

## 2021-07-19 VITALS — BP 116/53 | HR 57 | Wt 200.8 lb

## 2021-07-19 DIAGNOSIS — Z8774 Personal history of (corrected) congenital malformations of heart and circulatory system: Secondary | ICD-10-CM

## 2021-07-19 DIAGNOSIS — O3503X0 Maternal care for (suspected) central nervous system malformation or damage in fetus, choroid plexus cysts, not applicable or unspecified: Secondary | ICD-10-CM

## 2021-07-19 DIAGNOSIS — O0990 Supervision of high risk pregnancy, unspecified, unspecified trimester: Secondary | ICD-10-CM

## 2021-07-19 DIAGNOSIS — Z789 Other specified health status: Secondary | ICD-10-CM

## 2021-07-19 DIAGNOSIS — Z23 Encounter for immunization: Secondary | ICD-10-CM

## 2021-07-19 DIAGNOSIS — O350XX0 Maternal care for (suspected) central nervous system malformation in fetus, not applicable or unspecified: Secondary | ICD-10-CM

## 2021-07-19 NOTE — Progress Notes (Signed)
Pt states is having a lot of Pressure in lower belly.

## 2021-07-19 NOTE — Patient Instructions (Signed)
Tercer trimestre de embarazo °Third Trimester of Pregnancy °El tercer trimestre de embarazo va desde la semana 28 hasta la semana 40. También se dice que va desde el mes 7 hasta el mes 9. En este trimestre, el bebé en gestación (feto) crece muy rápidamente. Hacia el final del noveno mes, el bebé en gestación mide alrededor de 20 pulgadas (45 cm) de largo. Pesa entre 6 y 10 libras (2,70 y 4,50 kg). °Cambios en el cuerpo durante el tercer trimestre °Su organismo continúa atravesando por muchos cambios durante este período. Los cambios varían y generalmente vuelven a la normalidad después del nacimiento del bebé. °Cambios físicos °Seguirá aumentando de peso. Puede ser que aumente entre 25 y 35 libras (11 y 16 kg) hacia el final del embarazo. Si tiene bajo peso, puede aumentar entre 28 y 40 lb (unos 13 a 18 kg). Si tiene sobrepeso, puede aumentar entre 15 y 25 libras (unos 7 a 11 kg). °Podrán aparecer las primeras estrías en las caderas, el vientre (abdomen) y las mamas. °Las mamas seguirán creciendo y pueden doler. Un líquido amarillo (calostro) puede salir de sus pechos. Esta es la primera leche que usted produce para el bebé. °Tal vez haya cambios en el cabello. °El ombligo puede salir hacia afuera. °Puede observar que se le hinchan más las manos, la cara o los tobillos. °Cambios en la salud °Es posible que tenga acidez estomacal. °Es posible que tenga dificultades para defecar (estreñimiento). °Pueden aparecerle hemorroides. Estas son venas hinchadas en el ano que pueden picar o doler. °Puede comenzar a tener venas hinchadas (várices) en las piernas. °Puede presentar más dolor en la pelvis, la espalda o los muslos. °Puede presentar más hormigueo o entumecimiento en las manos, los brazos y las piernas. La piel de su vientre también puede sentirse entumecida. °Es posible que sienta falta de aire a medida que el útero se agranda. °Otros cambios °Es posible que haga pis (orine) con mayor frecuencia. °Puede tener más  problemas para dormir. °Puede notar que el bebé en gestación “baja” o se mueve más hacia bajo, en el vientre. °Puede notar más secreción proveniente de la vagina. °Puede sentir las articulaciones flojas y puede sentir dolor alrededor del hueso pélvico. °Siga estas instrucciones en su casa: °Medicamentos °Use los medicamentos de venta libre y los recetados solamente como se lo haya indicado el médico. Algunos medicamentos no son seguros durante el embarazo. °Tome vitaminas prenatales que contengan por lo menos 600 microgramos (mcg) de ácido fólico. °Comida y bebida °Consuma comidas saludables que incluyan lo siguiente: °Frutas y verduras frescas. °Cereales integrales. °Buenas fuentes de proteínas, como carne, huevos y tofu. °Productos lácteos con bajo contenido de grasa. °Evite la carne cruda y el jugo, la leche y el queso sin pasteurizar. Estos portan gérmenes que pueden provocar daño tanto a usted como al bebé. °Tome 4 o 5 comidas pequeñas en lugar de 3 comidas abundantes al día. °Es posible que deba tomar medidas para prevenir o tratar los problemas para defecar: °Beber suficiente líquido para mantener el pis (orina) de color amarillo pálido. °Come alimentos ricos en fibra. Entre ellos, frijoles, cereales integrales y frutas y verduras frescas. °Limitar los alimentos con alto contenido de grasa y azúcar. Estos incluyen alimentos fritos o dulces. °Actividad °Haga ejercicios solamente como se lo haya indicado el médico. Interrumpa la actividad física si comienza a tener cólicos en el útero. °Evite levantar pesos excesivos. °No haga ejercicio si hace demasiado calor, hay demasiada humedad o se encuentra en un lugar de mucha altura (  altitud elevada). °Si lo desea, puede continuar teniendo relaciones sexuales, a menos que el médico le indique lo contrario. °Alivio del dolor y del malestar °Haga pausas con frecuencia y descanse con las piernas levantadas (elevadas) si tiene calambres en las piernas o dolor en la parte  baja de la espalda. °Dese baños de asiento con agua tibia para aliviar el dolor o las molestias causadas por las hemorroides. Use una crema para las hemorroides si el médico la autoriza. °Use un sostén que le brinde buen soporte si sus mamas están sensibles. °Si desarrolla venas hinchadas y abultadas en las piernas: °Use medias de compresión según las indicaciones de su médico. °Levante los pies durante 15 minutos, 3 o 4 veces por día. °Limite la sal en sus alimentos. °Seguridad °Hable con el médico antes de recorrer largas distancias. °No se dé baños de inmersión en agua caliente, baños turcos ni saunas. °Use el cinturón de seguridad en todo momento mientras vaya en auto. °Hable con el médico si alguien le está haciendo daño o gritando mucho. °Preparación para la llegada del bebé °Para prepararse para la llegada de su bebé: °Tome clases prenatales. °Visite el hospital y recorra el área de maternidad. °Compre un asiento de seguridad orientado hacia atrás para llevar al bebé en el automóvil. Aprenda cómo instalarlo en el auto. °Prepare la habitación del bebé. Saque todas las almohadas y los animales de peluche de la cuna del bebé. °Instrucciones generales °Evite el contacto con las bandejas sanitarias de los gatos y la tierra que estos animales usan. Estos contienen gérmenes que pueden dañar al bebé y causar la pérdida del bebé ya sea aborto espontáneo o muerte fetal. °No se haga duchas vaginales ni use tampones. No use tampones ni toallas higiénicas perfumadas. °No fume ni consuma ningún producto que contenga nicotina o tabaco. Si necesita ayuda para dejar de fumar, consulte al médico. °No beba alcohol. °No use medicamentos a base de hierbas, drogas ilegales, ni medicamentos que el médico no haya autorizado. Las sustancias químicas de estos productos pueden afectar al bebé. °Cumpla con todas las visitas de seguimiento. Esto es importante. °Dónde buscar más información °American Pregnancy Association (Asociación  Americana del Embarazo): americanpregnancy.org °American College of Obstetricians and Gynecologists (Colegio Estadounidense de Obstetras y Ginecólogos): www.acog.org °Office on Women's Health (Oficina para la Salud de la Mujer): womenshealth.gov/pregnancy °Comuníquese con un médico si: °Tiene fiebre. °Tiene cólicos leves o siente presión en la parte baja del vientre. °Sufre un dolor persistente en el abdomen. °Vomita o hace deposiciones acuosas (diarrea). °Advierte líquido con mal olor que proviene de la vagina. °Siente dolor al orinar o hace orina con mal olor. °Tiene un dolor de cabeza que no desaparece después de tomar analgésicos. °Nota cambios en la visión o ve manchas delante de los ojos. °Solicite ayuda de inmediato si: °Rompe la bolsa. °Tiene contracciones regulares separadas por menos de 5 minutos. °Tiene sangrado o pequeñas pérdidas vaginales. °Tiene cólicos o dolor muy intensos en el vientre. °Tiene dificultad para respirar. °Sientes dolor en el pecho. °Se desmaya. °No ha sentido al bebé moverse durante el tiempo que le indicó el médico. °Tiene dolor, hinchazón o enrojecimiento nuevos en un brazo o una pierna o se produce un aumento de alguno de estos síntomas. °Resumen °El tercer trimestre comprende desde la semana 28 hasta la semana 40 (desde el mes 7 hasta el mes 9). Esta es la época en que el bebé en gestación crece muy rápidamente. °Durante este período, las molestias pueden aumentar a medida que usted sube   de peso y el bebé crece. °Prepárese para la llegada del bebé: asista a las clases prenatales, compre un asiento de seguridad orientado hacia atrás para llevar al bebé en auto y prepare la habitación del bebé. °Solicite ayuda de inmediato si tiene sangrado por la vagina, siente dolor en el pecho y tiene dificultad para respirar, o si no ha sentido al bebé moverse durante el tiempo que le indicó el médico. °Esta información no tiene como fin reemplazar el consejo del médico. Asegúrese de hacerle al  médico cualquier pregunta que tenga. °Document Revised: 04/26/2020 Document Reviewed: 04/26/2020 °Elsevier Patient Education © 2022 Elsevier Inc. ° °

## 2021-07-19 NOTE — Progress Notes (Signed)
Subjective:  Kimberly Reid is a 30 y.o. G3P2002 at [redacted]w[redacted]d being seen today for ongoing prenatal care.  She is currently monitored for the following issues for this high-risk pregnancy and has PCOS (polycystic ovarian syndrome); Insulin resistance; History of repair of coarctation of aorta; Supervision of high risk pregnancy, antepartum; History of gestational diabetes in prior pregnancy, currently pregnant; Language barrier; Congenital heart anomaly; Heart murmur; Obesity in pregnancy; BMI 35.0-35.9,adult; and Choroid plexus cysts, fetal, affecting care of mother, antepartum on their problem list.  Patient reports general discomforts of pregnancy.  Contractions: Not present. Vag. Bleeding: None.  Movement: Present. Denies leaking of fluid.   The following portions of the patient's history were reviewed and updated as appropriate: allergies, current medications, past family history, past medical history, past social history, past surgical history and problem list. Problem list updated.  Objective:   Vitals:   07/19/21 1041  BP: (!) 116/53  Pulse: (!) 57  Weight: 200 lb 12.8 oz (91.1 kg)    Fetal Status: Fetal Heart Rate (bpm): 145   Movement: Present     General:  Alert, oriented and cooperative. Patient is in no acute distress.  Skin: Skin is warm and dry. No rash noted.   Cardiovascular: Normal heart rate noted  Respiratory: Normal respiratory effort, no problems with respiration noted  Abdomen: Soft, gravid, appropriate for gestational age. Pain/Pressure: Present     Pelvic:  Cervical exam deferred        Extremities: Normal range of motion.  Edema: Trace  Mental Status: Normal mood and affect. Normal behavior. Normal judgment and thought content.   Urinalysis:      Assessment and Plan:  Pregnancy: G3P2002 at [redacted]w[redacted]d  1. Supervision of high risk pregnancy, antepartum Stable Tdap today  2. History of repair of coarctation of aorta Normal fetal ECHO  3. Choroid plexus cyst  of fetus affecting care of mother, antepartum, single or unspecified fetus F/U MFM U/S next week  4. Language barrier Live interupter used during today's visit  Preterm labor symptoms and general obstetric precautions including but not limited to vaginal bleeding, contractions, leaking of fluid and fetal movement were reviewed in detail with the patient. Please refer to After Visit Summary for other counseling recommendations.  Return in about 2 weeks (around 08/02/2021) for OB visit, face to face, any provider.   Hermina Staggers, MD

## 2021-07-23 ENCOUNTER — Encounter: Payer: Self-pay | Admitting: *Deleted

## 2021-07-23 ENCOUNTER — Ambulatory Visit: Payer: Self-pay | Attending: Obstetrics and Gynecology

## 2021-07-23 ENCOUNTER — Ambulatory Visit: Payer: Self-pay | Admitting: *Deleted

## 2021-07-23 ENCOUNTER — Other Ambulatory Visit: Payer: Self-pay

## 2021-07-23 VITALS — BP 101/53 | HR 75

## 2021-07-23 DIAGNOSIS — O350XX0 Maternal care for (suspected) central nervous system malformation in fetus, not applicable or unspecified: Secondary | ICD-10-CM | POA: Insufficient documentation

## 2021-07-23 DIAGNOSIS — Z3689 Encounter for other specified antenatal screening: Secondary | ICD-10-CM

## 2021-07-23 DIAGNOSIS — O09293 Supervision of pregnancy with other poor reproductive or obstetric history, third trimester: Secondary | ICD-10-CM

## 2021-07-23 DIAGNOSIS — Z3A31 31 weeks gestation of pregnancy: Secondary | ICD-10-CM

## 2021-07-23 DIAGNOSIS — O99213 Obesity complicating pregnancy, third trimester: Secondary | ICD-10-CM

## 2021-07-23 DIAGNOSIS — E669 Obesity, unspecified: Secondary | ICD-10-CM

## 2021-07-23 DIAGNOSIS — O358XX0 Maternal care for other (suspected) fetal abnormality and damage, not applicable or unspecified: Secondary | ICD-10-CM

## 2021-07-23 DIAGNOSIS — O3503X0 Maternal care for (suspected) central nervous system malformation or damage in fetus, choroid plexus cysts, not applicable or unspecified: Secondary | ICD-10-CM

## 2021-08-06 ENCOUNTER — Ambulatory Visit (INDEPENDENT_AMBULATORY_CARE_PROVIDER_SITE_OTHER): Payer: Self-pay | Admitting: Obstetrics & Gynecology

## 2021-08-06 ENCOUNTER — Other Ambulatory Visit: Payer: Self-pay

## 2021-08-06 VITALS — BP 115/67 | HR 75 | Wt 205.0 lb

## 2021-08-06 DIAGNOSIS — O0990 Supervision of high risk pregnancy, unspecified, unspecified trimester: Secondary | ICD-10-CM

## 2021-08-06 DIAGNOSIS — Z8774 Personal history of (corrected) congenital malformations of heart and circulatory system: Secondary | ICD-10-CM

## 2021-08-06 NOTE — Progress Notes (Signed)
   PRENATAL VISIT NOTE  Subjective:  Kimberly Reid is a 30 y.o. G3P2002 at [redacted]w[redacted]d being seen today for ongoing prenatal care.  She is currently monitored for the following issues for this high-risk pregnancy and has PCOS (polycystic ovarian syndrome); Insulin resistance; History of repair of coarctation of aorta; Supervision of high risk pregnancy, antepartum; History of gestational diabetes in prior pregnancy, currently pregnant; Language barrier; Heart murmur; Obesity in pregnancy; and BMI 35.0-35.9,adult on their problem list.  Patient reports no complaints.  Contractions: Not present. Vag. Bleeding: None.  Movement: Present. Denies leaking of fluid.   The following portions of the patient's history were reviewed and updated as appropriate: allergies, current medications, past family history, past medical history, past social history, past surgical history and problem list.   Objective:   Vitals:   08/06/21 1040  BP: 115/67  Pulse: 75  Weight: 205 lb (93 kg)    Fetal Status: Fetal Heart Rate (bpm): 141 Fundal Height: 34 cm Movement: Present     General:  Alert, oriented and cooperative. Patient is in no acute distress.  Skin: Skin is warm and dry. No rash noted.   Cardiovascular: Normal heart rate noted  Respiratory: Normal respiratory effort, no problems with respiration noted  Abdomen: Soft, gravid, appropriate for gestational age.  Pain/Pressure: Present     Pelvic: Cervical exam deferred        Extremities: Normal range of motion.  Edema: Trace  Mental Status: Normal mood and affect. Normal behavior. Normal judgment and thought content.   Assessment and Plan:  Pregnancy: G3P2002 at [redacted]w[redacted]d 1. Supervision of high risk pregnancy, antepartum   2. History of repair of coarctation of aorta Card f/u  Preterm labor symptoms and general obstetric precautions including but not limited to vaginal bleeding, contractions, leaking of fluid and fetal movement were reviewed in  detail with the patient. Please refer to After Visit Summary for other counseling recommendations.   Return in about 2 weeks (around 08/20/2021).  Future Appointments  Date Time Provider Department Center  09/07/2021  2:00 PM Tobb, Lavona Mound, DO [facility] None    Scheryl Darter, MD

## 2021-08-23 ENCOUNTER — Other Ambulatory Visit: Payer: Self-pay

## 2021-08-23 ENCOUNTER — Ambulatory Visit (INDEPENDENT_AMBULATORY_CARE_PROVIDER_SITE_OTHER): Payer: Self-pay | Admitting: Family Medicine

## 2021-08-23 VITALS — BP 106/70 | HR 63 | Wt 206.2 lb

## 2021-08-23 DIAGNOSIS — Z8774 Personal history of (corrected) congenital malformations of heart and circulatory system: Secondary | ICD-10-CM

## 2021-08-23 DIAGNOSIS — O99210 Obesity complicating pregnancy, unspecified trimester: Secondary | ICD-10-CM

## 2021-08-23 DIAGNOSIS — Z8632 Personal history of gestational diabetes: Secondary | ICD-10-CM

## 2021-08-23 DIAGNOSIS — E282 Polycystic ovarian syndrome: Secondary | ICD-10-CM

## 2021-08-23 DIAGNOSIS — O0990 Supervision of high risk pregnancy, unspecified, unspecified trimester: Secondary | ICD-10-CM

## 2021-08-23 DIAGNOSIS — Z23 Encounter for immunization: Secondary | ICD-10-CM

## 2021-08-23 DIAGNOSIS — O09299 Supervision of pregnancy with other poor reproductive or obstetric history, unspecified trimester: Secondary | ICD-10-CM

## 2021-08-23 NOTE — Progress Notes (Signed)
   PRENATAL VISIT NOTE  Subjective:  Kimberly Reid is a 30 y.o. G3P2002 at 101w5d being seen today for ongoing prenatal care.  She is currently monitored for the following issues for this high-risk pregnancy and has PCOS (polycystic ovarian syndrome); Insulin resistance; History of repair of coarctation of aorta; Supervision of high risk pregnancy, antepartum; History of gestational diabetes in prior pregnancy, currently pregnant; Language barrier; Heart murmur; Obesity in pregnancy; and BMI 35.0-35.9,adult on their problem list.  Patient reports no complaints.  Contractions: Irritability. Vag. Bleeding: None.  Movement: Present. Denies leaking of fluid.   The following portions of the patient's history were reviewed and updated as appropriate: allergies, current medications, past family history, past medical history, past social history, past surgical history and problem list.   Objective:   Vitals:   08/23/21 1101  BP: 106/70  Pulse: 63  Weight: 206 lb 3.2 oz (93.5 kg)    Fetal Status: Fetal Heart Rate (bpm): 143   Movement: Present     General:  Alert, oriented and cooperative. Patient is in no acute distress.  Skin: Skin is warm and dry. No rash noted.   Cardiovascular: Normal heart rate noted  Respiratory: Normal respiratory effort, no problems with respiration noted  Abdomen: Soft, gravid, appropriate for gestational age.  Pain/Pressure: Present     Pelvic: Cervical exam deferred        Extremities: Normal range of motion.  Edema: Trace  Mental Status: Normal mood and affect. Normal behavior. Normal judgment and thought content.   Assessment and Plan:  Pregnancy: G3P2002 at [redacted]w[redacted]d 1. Supervision of high risk pregnancy, antepartum Considering BTS vs LARC. Discussed paying for BTS through [facility] and also free LARC through [facility] - Flu Vaccine QUAD 61mo+IM (Fluarix, Fluzone & Alfiuria Quad PF)  2. Obesity in pregnancy TWG=29 lb 3.2 oz (13.2 kg) which is above goal  3.  History of gestational diabetes in prior pregnancy, currently pregnant Negative testing this pregnancy  4. History of repair of coarctation of aorta Seen by cardiology  5. PCOS (polycystic ovarian syndrome)   Preterm labor symptoms and general obstetric precautions including but not limited to vaginal bleeding, contractions, leaking of fluid and fetal movement were reviewed in detail with the patient. Please refer to After Visit Summary for other counseling recommendations.   Return in about 1 week (around 08/30/2021) for Routine prenatal care, 36wks.  Future Appointments  Date Time Provider Department Center  08/31/2021  8:35 AM Adam Phenix, MD [facility]  09/07/2021  9:35 AM Adam Phenix, MD [facility]  09/07/2021  2:00 PM Thomasene Ripple, DO [facility] None    Federico Flake, MD

## 2021-08-31 ENCOUNTER — Other Ambulatory Visit: Payer: Self-pay

## 2021-08-31 ENCOUNTER — Ambulatory Visit (INDEPENDENT_AMBULATORY_CARE_PROVIDER_SITE_OTHER): Payer: Self-pay | Admitting: Obstetrics & Gynecology

## 2021-08-31 ENCOUNTER — Other Ambulatory Visit (HOSPITAL_COMMUNITY)
Admission: RE | Admit: 2021-08-31 | Discharge: 2021-08-31 | Disposition: A | Discharge status: Home / Self Care | Payer: Self-pay | Source: Ambulatory Visit | Attending: Obstetrics & Gynecology | Admitting: Obstetrics & Gynecology

## 2021-08-31 VITALS — BP 115/68 | HR 90 | Wt 207.5 lb

## 2021-08-31 DIAGNOSIS — Z8774 Personal history of (corrected) congenital malformations of heart and circulatory system: Secondary | ICD-10-CM

## 2021-08-31 DIAGNOSIS — Z8632 Personal history of gestational diabetes: Secondary | ICD-10-CM

## 2021-08-31 DIAGNOSIS — O09299 Supervision of pregnancy with other poor reproductive or obstetric history, unspecified trimester: Secondary | ICD-10-CM

## 2021-08-31 DIAGNOSIS — O0990 Supervision of high risk pregnancy, unspecified, unspecified trimester: Secondary | ICD-10-CM

## 2021-08-31 NOTE — Progress Notes (Signed)
   PRENATAL VISIT NOTE  Subjective:  Kimberly Reid is a 30 y.o. G3P2002 at [redacted]w[redacted]d being seen today for ongoing prenatal care.  She is currently monitored for the following issues for this high-risk pregnancy and has PCOS (polycystic ovarian syndrome); Insulin resistance; History of repair of coarctation of aorta; Supervision of high risk pregnancy, antepartum; History of gestational diabetes in prior pregnancy, currently pregnant; Language barrier; Heart murmur; Obesity in pregnancy; and BMI 35.0-35.9,adult on their problem list.  Patient reports  two episodes of pelvic and vaginal pressure (08/26/21 and yesterday) .  Contractions: Irritability. Vag. Bleeding: None.  Movement: Present. Denies leaking of fluid.   The following portions of the patient's history were reviewed and updated as appropriate: allergies, current medications, past family history, past medical history, past social history, past surgical history and problem list.   Objective:   Vitals:   08/31/21 0850  BP: 115/68  Pulse: 90  Weight: 207 lb 8 oz (94.1 kg)    Fetal Status: Fetal Heart Rate (bpm): 136   Movement: Present     General:  Alert, oriented and cooperative. Patient is in no acute distress.  Skin: Skin is warm and dry. No rash noted.   Cardiovascular: Normal heart rate noted  Respiratory: Normal respiratory effort, no problems with respiration noted  Abdomen: Soft, gravid, appropriate for gestational age.  Pain/Pressure: Present     Pelvic: Cervical exam performed in the presence of a chaperone. Cervix is closed with no evidence of pre-term labor.        Extremities: Normal range of motion.  Edema: None  Mental Status: Normal mood and affect. Normal behavior. Normal judgment and thought content.   Assessment and Plan:  Pregnancy: G3P2002 at [redacted]w[redacted]d 1. Supervision of high risk pregnancy, antepartum Patient has had two episodes of pelvic/vaginal pressure without vaginal bleeding, contractions, or leakage  of fluid. Cervix closed on exam. -Cervical exam today reassuring -GBS swab completed today -STI screening swab completed today  2. History of repair of coarctation of aorta Followed by cardiology  3. History of gestational diabetes in prior pregnancy, currently pregnant Has had negative testing for this pregnancy.  Preterm labor symptoms and general obstetric precautions including but not limited to vaginal bleeding, contractions, leaking of fluid and fetal movement were reviewed in detail with the patient. Please refer to After Visit Summary for other counseling recommendations.   Return in about 1 week (around 09/07/2021).  Future Appointments  Date Time Provider Department Center  09/07/2021  9:35 AM Adam Phenix, MD [facility]  09/07/2021  2:00 PM Tobb, Lavona Mound, DO [facility] None    Val Eagle, Medical Student  Attestation of Attending Supervision of Medical Student: Evaluation and management procedures were performed by the medical student under my supervision and collaboration.  I have reviewed the student's note and chart, and I agree with the management and plan.  Scheryl Darter, MD, FACOG Attending Obstetrician & Gynecologist Faculty Practice, [facility]

## 2021-09-03 LAB — GC/CHLAMYDIA PROBE AMP (~~LOC~~) NOT AT ARMC
Chlamydia: NEGATIVE
Comment: NEGATIVE
Comment: NORMAL
Neisseria Gonorrhea: NEGATIVE

## 2021-09-04 LAB — CULTURE, BETA STREP (GROUP B ONLY): Strep Gp B Culture: NEGATIVE

## 2021-09-07 ENCOUNTER — Ambulatory Visit: Payer: Self-pay | Admitting: Cardiology

## 2021-09-07 ENCOUNTER — Ambulatory Visit (INDEPENDENT_AMBULATORY_CARE_PROVIDER_SITE_OTHER): Payer: Self-pay | Admitting: Obstetrics & Gynecology

## 2021-09-07 ENCOUNTER — Telehealth: Payer: Self-pay

## 2021-09-07 ENCOUNTER — Other Ambulatory Visit: Payer: Self-pay

## 2021-09-07 VITALS — BP 118/69 | HR 95 | Wt 208.0 lb

## 2021-09-07 DIAGNOSIS — Z6835 Body mass index (BMI) 35.0-35.9, adult: Secondary | ICD-10-CM

## 2021-09-07 DIAGNOSIS — O0990 Supervision of high risk pregnancy, unspecified, unspecified trimester: Secondary | ICD-10-CM

## 2021-09-07 DIAGNOSIS — Z758 Other problems related to medical facilities and other health care: Secondary | ICD-10-CM

## 2021-09-07 DIAGNOSIS — Z789 Other specified health status: Secondary | ICD-10-CM

## 2021-09-07 DIAGNOSIS — O09299 Supervision of pregnancy with other poor reproductive or obstetric history, unspecified trimester: Secondary | ICD-10-CM

## 2021-09-07 DIAGNOSIS — Z8632 Personal history of gestational diabetes: Secondary | ICD-10-CM

## 2021-09-07 NOTE — Telephone Encounter (Signed)
Called pt, phone continued to ring. Unable to leave a message.

## 2021-09-07 NOTE — Progress Notes (Signed)
Patient stated that she had "little contractions" last Wednesday but denies any pain

## 2021-09-07 NOTE — Progress Notes (Signed)
   PRENATAL VISIT NOTE  Subjective:  Kimberly Reid is a 30 y.o. G3P2002 at [redacted]w[redacted]d being seen today for ongoing prenatal care.  She is currently monitored for the following issues for this high-risk pregnancy and has PCOS (polycystic ovarian syndrome); Insulin resistance; History of repair of coarctation of aorta; Supervision of high risk pregnancy, antepartum; History of gestational diabetes in prior pregnancy, currently pregnant; Language barrier; Heart murmur; Obesity in pregnancy; and BMI 35.0-35.9,adult on their problem list.  Patient reports occasional contractions.  Contractions: Irritability. Vag. Bleeding: None.  Movement: Present. Denies leaking of fluid.   The following portions of the patient's history were reviewed and updated as appropriate: allergies, current medications, past family history, past medical history, past social history, past surgical history and problem list.   Objective:   Vitals:   09/07/21 0940  BP: 118/69  Pulse: 95  Weight: 208 lb (94.3 kg)    Fetal Status: Fetal Heart Rate (bpm): 144   Movement: Present     General:  Alert, oriented and cooperative. Patient is in no acute distress.  Skin: Skin is warm and dry. No rash noted.   Cardiovascular: Normal heart rate noted  Respiratory: Normal respiratory effort, no problems with respiration noted  Abdomen: Soft, gravid, appropriate for gestational age.  Pain/Pressure: Absent     Pelvic: Cervical exam deferred        Extremities: Normal range of motion.  Edema: None  Mental Status: Normal mood and affect. Normal behavior. Normal judgment and thought content.   Assessment and Plan:  Pregnancy: G3P2002 at [redacted]w[redacted]d 1. BMI 35.0-35.9,adult Body mass index is 36.85 kg/m.   2. Supervision of high risk pregnancy, antepartum   3. History of gestational diabetes in prior pregnancy, currently pregnant Normal glucose testing  4. Language barrier Spanish interpreter  Term labor symptoms and general  obstetric precautions including but not limited to vaginal bleeding, contractions, leaking of fluid and fetal movement were reviewed in detail with the patient. Please refer to After Visit Summary for other counseling recommendations.   Return in about 1 week (around 09/14/2021).  Future Appointments  Date Time Provider Department Center  09/07/2021  2:00 PM Tobb, Lavona Mound, DO [facility] None    Scheryl Darter, MD

## 2021-09-14 ENCOUNTER — Encounter: Payer: Self-pay | Admitting: Obstetrics & Gynecology

## 2021-09-14 ENCOUNTER — Ambulatory Visit (INDEPENDENT_AMBULATORY_CARE_PROVIDER_SITE_OTHER): Payer: Self-pay | Admitting: Obstetrics & Gynecology

## 2021-09-14 ENCOUNTER — Other Ambulatory Visit: Payer: Self-pay

## 2021-09-14 VITALS — BP 110/72 | HR 73 | Wt 209.0 lb

## 2021-09-14 DIAGNOSIS — Z3A38 38 weeks gestation of pregnancy: Secondary | ICD-10-CM

## 2021-09-14 DIAGNOSIS — O0990 Supervision of high risk pregnancy, unspecified, unspecified trimester: Secondary | ICD-10-CM

## 2021-09-14 NOTE — Patient Instructions (Addendum)
Induccin del trabajo de parto Labor Induction La induccin del trabajo de parto hace referencia a las acciones que se inician para Radio producer que una mujer embarazada comience el [place] de [place]. La [employer] de las mujeres comienzan el trabajo de parto de forma natural entre las semanas 37 y 42 del Psychiatrist. Cuando esto no ocurre o cuando, por necesidad [place], se debe iniciar el [place] de [place], se pueden seguir otros pasos para inducir el trabajo de [place]. La induccin del trabajo de parto hace que el tero se contraiga. Tambin hace que el cuello uterino se ablande (madure), se abra (se dilate) y se afine. Generalmente, el trabajo de parto no se induce antes de las [address] excepto que haya un motivo mdico para Media planner. Cundo se considera la induccin del [place] de [place]? La induccin del [place] de parto puede ser [place] para usted si: Su embarazo dura ms de 41 a 42 semanas. Se le est separando la placenta del tero (desprendimiento de la placenta). Rompe la bolsa de aguas y [place] de parto no comienza. Tiene problemas de [place], como diabetes o presin arterial alta (preeclampsia) durante el embarazo. Su beb ha dejado de crecer o no tiene suficiente lquido Teacher, music. Antes de inducir el trabajo de [place], el mdico tendr en cuenta los siguientes factores: Su estado clnico y [place] del beb. Cuntas semanas tiene de [place]. La madurez de los pulmones del beb. El estado del cuello uterino. La posicin del beb. El tamao del canal del parto. Informe al mdico acerca de lo siguiente: Cualquier alergia que tenga. Todos los [employer] [place], incluidos vitaminas, hierbas, gotas oftlmicas, cremas y [address] de [address]. Problemas previos que usted o sus familiares hayan tenido con los anestsicos. Cirugas a las que se haya sometido. Cualquier trastorno de la sangre que tenga. Cualquier afeccin mdica que tenga. Cules son los riesgos? En general, se trata de  un procedimiento seguro. Sin embargo, pueden ocurrir complicaciones, por ejemplo: Fracaso de la induccin. Cambios en la frecuencia cardaca fetal, por ejemplo, los latidos son demasiado rpidos o lentos, o son irregulares (errticos). Infeccin en la madre o el beb. Aumento de la posibilidad de que sea necesaria una cesrea. Ruptura (desprendimiento) de la placenta del tero. Esto es poco frecuente. Ruptura del tero. Esto ocurre en muy contadas ocasiones. El beb podra no recibir suficiente irrigacin sangunea u oxgeno. Esto puede ser potencialmente mortal. Cuando es necesario realizar una induccin por motivos mdicos, los beneficios suelen [employer]. Qu ocurre durante el procedimiento? Durante el procedimiento, el mdico utilizar uno de estos mtodos para inducir el [place] de parto: Ruptura de las [place]. En este mtodo, se separa, con cuidado, el tejido del saco amnitico del cuello uterino. Esto provoca que suceda lo siguiente: El cuello uterino se estira, lo que, a su vez, provoca la liberacin de prostaglandinas. Las prostaglandinas inducen el trabajo de parto y [address] que el tero se Technical sales engineer. Con frecuencia, este procedimiento se realiza durante una visita en el consultorio mdico. Le indicarn que vuelva a su casa y espere que se inicien las contracciones. Administracin de prostaglandina. Este medicamento hace que se inicien las contracciones y que el cuello uterino se dilate y [place]. Puede tomarse por la boca (por va oral) o introducirse en la vagina (supositorio). Insercin de un pequeo tubo delgado (catter) que tiene un baln en el extremo en la vagina; luego, expansin del baln con agua para dilatar el cuello uterino. Romper la bolsa de las aguas. En este mtodo,  se [place] un instrumento pequeo para hacer un pequeo orificio en el saco amnitico. Al cabo de un tiempo, esto har que el saco amnitico se rompa. Las contracciones deberan comenzar algunas horas  despus. Medicamentos que desencadenen o intensifiquen las contracciones. Estos se administran a travs de una va intravenosa (IV) que se coloca en una vena del brazo. Este procedimiento puede variar segn el mdico y el hospital. Dnde buscar ms informacin March of Dimes: www.marchofdimes.org [employer] of Obstetricians and Gynecologists ([employer] Estad[employer] de Obstetras y Gineclogos): www.acog.org Resumen La induccin del trabajo de parto hace que el tero se contraiga. Tambin hace que el cuello uterino se ablande (madure), se abra (se dilate) y se afine. Generalmente, el trabajo de [place] no se induce antes de las [address] de [place], excepto que haya un motivo mdico para Media planner. Cuando es Passenger transport manager una induccin por motivos mdicos, los beneficios suelen [employer]. Hable con su mdico sobre qu mtodos de induccin del [place] de parto son adecuados para usted. Esta informacin no tiene Theme park manager el consejo del mdico. Asegrese de hacerle al mdico cualquier pregunta que tenga. Document Revised: 08/25/2020 Document Reviewed: 08/25/2020 Elsevier Patient Education  2022 [employer].

## 2021-09-14 NOTE — Progress Notes (Signed)
   PRENATAL VISIT NOTE  Subjective:  Kimberly Reid is a 30 y.o. G3P2002 at [redacted]w[redacted]d being seen today for ongoing prenatal care.  Patient is Spanish-speaking only, interpreter present for this encounter. She is currently monitored for the following issues for this high-risk pregnancy and has PCOS (polycystic ovarian syndrome); Insulin resistance; History of repair of coarctation of aorta; Supervision of high risk pregnancy, antepartum; History of gestational diabetes in prior pregnancy, currently pregnant; Language barrier; Heart murmur; Obesity in pregnancy; and BMI 35.0-35.9,adult on their problem list.  Patient reports occasional contractions.  Contractions: Irritability. Vag. Bleeding: None.  Movement: Present. Denies leaking of fluid.   The following portions of the patient's history were reviewed and updated as appropriate: allergies, current medications, past family history, past medical history, past social history, past surgical history and problem list.   Objective:   Vitals:   09/14/21 1022  BP: 110/72  Pulse: 73  Weight: 209 lb (94.8 kg)    Fetal Status: Fetal Heart Rate (bpm): 157 Fundal Height: 39 cm Movement: Present  Presentation: Vertex  General:  Alert, oriented and cooperative. Patient is in no acute distress.  Skin: Skin is warm and dry. No rash noted.   Cardiovascular: Normal heart rate noted  Respiratory: Normal respiratory effort, no problems with respiration noted  Abdomen: Soft, gravid, appropriate for gestational age.  Pain/Pressure: Present     Pelvic: Cervical exam performed in the presence of a chaperone Dilation: 1 Effacement (%): Thick Station: Ballotable  Extremities: Normal range of motion.  Edema: Trace  Mental Status: Normal mood and affect. Normal behavior. Normal judgment and thought content.   Assessment and Plan:  Pregnancy: G3P2002 at [redacted]w[redacted]d 1. [redacted] weeks gestation of pregnancy 2. Supervision of high risk pregnancy, antepartum Patient desires  IOL at her due date.  Discussed IOL at 40 weeks, she was told she can go up to 41 weeks to maximize chances of spontaneous labor, also talked about need for post term BPP.  She still desired IOL at 40 weeks.  Risks and benefits of induction were reviewed, including failure of method, prolonged labor, need for further intervention, risk of cesarean section.  Patient understand these risks and wish to proceed.   Induction of labor scheduled on 09/23/21 at midnight, orders have been signed and held.Marland Kitchen She was told to expect a call from [facility] L&D with further instructions about her pre-admission COVID screening and any further instructions about the induction of labor. She was told that the only scheduled time slot was midnight, patients are called in during the morning between 6:30am -9 am to come in for their induction as soon as the rooms and staff are ready for them.  Term labor symptoms and general obstetric precautions including but not limited to vaginal bleeding, contractions, leaking of fluid and fetal movement were reviewed in detail with the patient. Please refer to After Visit Summary for other counseling recommendations.   Return for Postpartum check (IOL scheduled 09/23/21 at midnight).  Future Appointments  Date Time Provider Department Center  09/23/2021 12:00 AM [facility] SCHED ROOM [facility] None  09/26/2021  9:35 AM Crissie Reese, Mary Sella, MD [facility]    Jaynie Collins, MD

## 2021-09-17 ENCOUNTER — Other Ambulatory Visit: Payer: Self-pay | Admitting: Advanced Practice Midwife

## 2021-09-22 ENCOUNTER — Other Ambulatory Visit: Payer: Self-pay

## 2021-09-23 ENCOUNTER — Inpatient Hospital Stay (HOSPITAL_COMMUNITY): Payer: Medicaid Other | Admitting: Anesthesiology

## 2021-09-23 ENCOUNTER — Inpatient Hospital Stay (HOSPITAL_COMMUNITY)
Admission: AD | Admit: 2021-09-23 | Discharge: 2021-09-24 | DRG: 807 | Disposition: A | Discharge status: Home / Self Care | Payer: Medicaid Other | Attending: Obstetrics and Gynecology | Admitting: Obstetrics and Gynecology

## 2021-09-23 ENCOUNTER — Encounter (HOSPITAL_COMMUNITY): Payer: Self-pay | Admitting: Obstetrics & Gynecology

## 2021-09-23 ENCOUNTER — Inpatient Hospital Stay (HOSPITAL_COMMUNITY): Payer: Medicaid Other

## 2021-09-23 DIAGNOSIS — Z789 Other specified health status: Secondary | ICD-10-CM | POA: Diagnosis present

## 2021-09-23 DIAGNOSIS — Z20822 Contact with and (suspected) exposure to covid-19: Secondary | ICD-10-CM | POA: Diagnosis present

## 2021-09-23 DIAGNOSIS — O480 Post-term pregnancy: Secondary | ICD-10-CM | POA: Diagnosis present

## 2021-09-23 DIAGNOSIS — O99214 Obesity complicating childbirth: Secondary | ICD-10-CM | POA: Diagnosis present

## 2021-09-23 DIAGNOSIS — Z3A40 40 weeks gestation of pregnancy: Secondary | ICD-10-CM

## 2021-09-23 DIAGNOSIS — Z8616 Personal history of COVID-19: Secondary | ICD-10-CM

## 2021-09-23 DIAGNOSIS — Z8774 Personal history of (corrected) congenital malformations of heart and circulatory system: Secondary | ICD-10-CM

## 2021-09-23 LAB — CBC
HCT: 37.6 % (ref 36.0–46.0)
Hemoglobin: 12.9 g/dL (ref 12.0–15.0)
MCH: 30 pg (ref 26.0–34.0)
MCHC: 34.3 g/dL (ref 30.0–36.0)
MCV: 87.4 fL (ref 80.0–100.0)
Platelets: 191 10*3/uL (ref 150–400)
RBC: 4.3 MIL/uL (ref 3.87–5.11)
RDW: 13.2 % (ref 11.5–15.5)
WBC: 8.4 10*3/uL (ref 4.0–10.5)
nRBC: 0 % (ref 0.0–0.2)

## 2021-09-23 LAB — TYPE AND SCREEN
ABO/RH(D): O POS
Antibody Screen: NEGATIVE

## 2021-09-23 LAB — RESP PANEL BY RT-PCR (FLU A&B, COVID) ARPGX2
Influenza A by PCR: NEGATIVE
Influenza B by PCR: NEGATIVE
SARS Coronavirus 2 by RT PCR: NEGATIVE

## 2021-09-23 LAB — RPR: RPR Ser Ql: NONREACTIVE

## 2021-09-23 MED ORDER — OXYTOCIN-SODIUM CHLORIDE 30-0.9 UT/500ML-% IV SOLN
2.5000 [IU]/h | INTRAVENOUS | Status: DC
  Filled 2021-09-23: qty 500

## 2021-09-23 MED ORDER — EPHEDRINE 5 MG/ML INJ: 10.0000 mg | INTRAVENOUS | Status: DC | PRN

## 2021-09-23 MED ORDER — FENTANYL-BUPIVACAINE-NACL 0.5-0.125-0.9 MG/250ML-% EP SOLN
12.0000 mL/h | EPIDURAL | Status: DC | PRN
  Administered 2021-09-23: 13:00:00 12 mL/h via EPIDURAL
  Filled 2021-09-23: qty 250

## 2021-09-23 MED ORDER — ZOLPIDEM TARTRATE 5 MG PO TABS: 5.0000 mg | ORAL_TABLET | Freq: Every evening | ORAL | Status: DC | PRN

## 2021-09-23 MED ORDER — COCONUT OIL OIL: 1.0000 "application " | TOPICAL_OIL | Status: DC | PRN

## 2021-09-23 MED ORDER — OXYCODONE HCL 5 MG PO TABS: 5.0000 mg | ORAL_TABLET | ORAL | Status: DC | PRN

## 2021-09-23 MED ORDER — SENNOSIDES-DOCUSATE SODIUM 8.6-50 MG PO TABS
2.0000 | ORAL_TABLET | Freq: Every day | ORAL | Status: DC
  Administered 2021-09-24: 13:00:00 2 via ORAL
  Filled 2021-09-23: qty 2

## 2021-09-23 MED ORDER — ONDANSETRON HCL 4 MG/2ML IJ SOLN: 4.0000 mg | Freq: Four times a day (QID) | INTRAMUSCULAR | Status: DC | PRN

## 2021-09-23 MED ORDER — FENTANYL CITRATE (PF) 100 MCG/2ML IJ SOLN: 100.0000 ug | INTRAMUSCULAR | Status: DC | PRN

## 2021-09-23 MED ORDER — SOD CITRATE-CITRIC ACID 500-334 MG/5ML PO SOLN: 30.0000 mL | ORAL | Status: DC | PRN

## 2021-09-23 MED ORDER — ACETAMINOPHEN 325 MG PO TABS: 650.0000 mg | ORAL_TABLET | ORAL | Status: DC | PRN

## 2021-09-23 MED ORDER — OXYTOCIN-SODIUM CHLORIDE 30-0.9 UT/500ML-% IV SOLN: 1.0000 m[IU]/min | INTRAVENOUS | Status: DC

## 2021-09-23 MED ORDER — LACTATED RINGERS IV SOLN: INTRAVENOUS | Status: DC

## 2021-09-23 MED ORDER — MISOPROSTOL 25 MCG QUARTER TABLET
25.0000 ug | ORAL_TABLET | ORAL | Status: DC | PRN
  Administered 2021-09-23 (×2): 25 ug via VAGINAL
  Filled 2021-09-23 (×2): qty 1

## 2021-09-23 MED ORDER — TERBUTALINE SULFATE 1 MG/ML IJ SOLN
0.2500 mg | Freq: Once | INTRAMUSCULAR | Status: DC | PRN
  Filled 2021-09-23: qty 1

## 2021-09-23 MED ORDER — LACTATED RINGERS IV SOLN
500.0000 mL | INTRAVENOUS | Status: DC | PRN
  Administered 2021-09-23: 01:00:00 500 mL via INTRAVENOUS

## 2021-09-23 MED ORDER — PHENYLEPHRINE 40 MCG/ML (10ML) SYRINGE FOR IV PUSH (FOR BLOOD PRESSURE SUPPORT): 80.0000 ug | PREFILLED_SYRINGE | INTRAVENOUS | Status: DC | PRN

## 2021-09-23 MED ORDER — OXYTOCIN BOLUS FROM INFUSION
333.0000 mL | Freq: Once | INTRAVENOUS | Status: AC
  Administered 2021-09-23: 15:00:00 333 mL via INTRAVENOUS

## 2021-09-23 MED ORDER — ONDANSETRON HCL 4 MG PO TABS: 4.0000 mg | ORAL_TABLET | ORAL | Status: DC | PRN

## 2021-09-23 MED ORDER — HYDROXYZINE HCL 50 MG PO TABS: 50.0000 mg | ORAL_TABLET | Freq: Four times a day (QID) | ORAL | Status: DC | PRN

## 2021-09-23 MED ORDER — DIPHENHYDRAMINE HCL 50 MG/ML IJ SOLN: 12.5000 mg | INTRAMUSCULAR | Status: DC | PRN

## 2021-09-23 MED ORDER — SIMETHICONE 80 MG PO CHEW: 80.0000 mg | CHEWABLE_TABLET | ORAL | Status: DC | PRN

## 2021-09-23 MED ORDER — FENTANYL CITRATE (PF) 100 MCG/2ML IJ SOLN
50.0000 ug | INTRAMUSCULAR | Status: DC | PRN
Start: 2021-09-23 — End: 2021-09-23

## 2021-09-23 MED ORDER — PRENATAL MULTIVITAMIN CH
1.0000 | ORAL_TABLET | Freq: Every day | ORAL | Status: DC
  Administered 2021-09-24: 13:00:00 1 via ORAL
  Filled 2021-09-23: qty 1

## 2021-09-23 MED ORDER — OXYCODONE-ACETAMINOPHEN 5-325 MG PO TABS: 2.0000 | ORAL_TABLET | ORAL | Status: DC | PRN

## 2021-09-23 MED ORDER — LACTATED RINGERS IV SOLN: 500.0000 mL | Freq: Once | INTRAVENOUS | Status: DC

## 2021-09-23 MED ORDER — BENZOCAINE-MENTHOL 20-0.5 % EX AERO: 1.0000 "application " | INHALATION_SPRAY | CUTANEOUS | Status: DC | PRN

## 2021-09-23 MED ORDER — ONDANSETRON HCL 4 MG/2ML IJ SOLN: 4.0000 mg | INTRAMUSCULAR | Status: DC | PRN

## 2021-09-23 MED ORDER — LACTATED RINGERS AMNIOINFUSION: INTRAVENOUS | Status: DC

## 2021-09-23 MED ORDER — WITCH HAZEL-GLYCERIN EX PADS: 1.0000 "application " | MEDICATED_PAD | CUTANEOUS | Status: DC | PRN

## 2021-09-23 MED ORDER — IBUPROFEN 600 MG PO TABS
600.0000 mg | ORAL_TABLET | Freq: Four times a day (QID) | ORAL | Status: DC
  Administered 2021-09-23 – 2021-09-24 (×4): 600 mg via ORAL
  Filled 2021-09-23 (×4): qty 1

## 2021-09-23 MED ORDER — DIPHENHYDRAMINE HCL 25 MG PO CAPS: 25.0000 mg | ORAL_CAPSULE | Freq: Four times a day (QID) | ORAL | Status: DC | PRN

## 2021-09-23 MED ORDER — TETANUS-DIPHTH-ACELL PERTUSSIS 5-2.5-18.5 LF-MCG/0.5 IM SUSY: 0.5000 mL | PREFILLED_SYRINGE | Freq: Once | INTRAMUSCULAR | Status: DC

## 2021-09-23 MED ORDER — DIBUCAINE (PERIANAL) 1 % EX OINT: 1.0000 "application " | TOPICAL_OINTMENT | CUTANEOUS | Status: DC | PRN

## 2021-09-23 MED ORDER — LIDOCAINE HCL (PF) 1 % IJ SOLN: 30.0000 mL | INTRAMUSCULAR | Status: DC | PRN

## 2021-09-23 MED ORDER — MISOPROSTOL 50MCG HALF TABLET
50.0000 ug | ORAL_TABLET | Freq: Once | ORAL | Status: AC
  Administered 2021-09-23: 10:00:00 50 ug via BUCCAL
  Filled 2021-09-23: qty 1

## 2021-09-23 MED ORDER — OXYCODONE-ACETAMINOPHEN 5-325 MG PO TABS: 1.0000 | ORAL_TABLET | ORAL | Status: DC | PRN

## 2021-09-23 MED ORDER — LIDOCAINE HCL (PF) 1 % IJ SOLN
INTRAMUSCULAR | Status: DC | PRN
  Administered 2021-09-23 (×2): 4 mL via EPIDURAL

## 2021-09-23 MED ORDER — FLEET ENEMA 7-19 GM/118ML RE ENEM: 1.0000 | ENEMA | Freq: Every day | RECTAL | Status: DC | PRN

## 2021-09-23 NOTE — Progress Notes (Signed)
Kimberly Reid is a 30 y.o. G3P3003 at [redacted]w[redacted]d.  Subjective: Mild-mod contractions. Coping well.   Objective: BP 114/70   Pulse (!) 46   Temp 97.9 F (36.6 C) (Oral)   Resp 18   Ht 5\' 2"  (1.575 m)   Wt 97.3 kg   LMP 10/30/2020 (Exact Date)   SpO2 99%   Breastfeeding Unknown   BMI 39.21 kg/m    FHT:  FHR: 135 bpm, variability: mod,  accelerations:  15x15,  decelerations:  none UC:   Q 2-7 minutes, mild-mod Dilation: 3 Effacement (%): 60 Cervical Position: Middle Station: -3 Presentation: Vertex Exam by: 12/28/2020 SNM,  Raelyn Number, CNM  Pt dilated 2-3 during attempt at foley placement by SNM. Foley bulb no longer needed,   Labs: NA  Assessment / Plan: [redacted]w[redacted]d week IUP Labor: Early. Contracting well off of cytotec. Will consider AROM, Pitocin PRN.   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 Maternal and fetal echos: normal. Pt OK to labor and deliver vaginally per Dr. [redacted]w[redacted]d, Cardiology.   Servando Salina, Katrinka Blazing, CNM 09/23/2021 11:17 AM

## 2021-09-23 NOTE — Progress Notes (Signed)
Labor Progress Note Kimberly Reid is a 30 y.o. G3P2002 at [redacted]w[redacted]d presented for term IOL  S: She is doing well. No complaints at this time. Still having intermittent contractions. No LOF. Vertex presentation confirmed on BSUS.  O:  BP 120/66   Pulse 60   Temp 98.4 F (36.9 C) (Oral)   Resp 17   Ht 5\' 2"  (1.575 m)   Wt 97.3 kg   LMP 10/30/2020 (Exact Date)   BMI 39.21 kg/m  EFM: baseline 135 bpm/moderate variability/accels present, no decels  CVE: Dilation: 1.5 Effacement (%): Thick Cervical Position: Posterior Station: -3 Presentation: Vertex Exam by:: Dr. [email or web address]   A&P: 30 y.o. 38 SIUP at [redacted]w[redacted]d  #Labor: Progressing well. Will give another cytotec now and recheck in 4 hours #Pain: controlled, requests epidural when available #FWB: Cat 1 #GBS negative  [redacted]w[redacted]d, MD 4:47 AM

## 2021-09-23 NOTE — Anesthesia Procedure Notes (Signed)
Epidural Patient location during procedure: OB Start time: 09/23/2021 12:37 PM End time: 09/23/2021 12:40 PM  Staffing Anesthesiologist: Kaylyn Layer, MD Performed: anesthesiologist   Preanesthetic Checklist Completed: patient identified, IV checked, risks and benefits discussed, monitors and equipment checked, pre-op evaluation and timeout performed  Epidural Patient position: sitting Prep: DuraPrep and site prepped and draped Patient monitoring: continuous pulse ox, blood pressure and heart rate Approach: midline Location: L3-L4 Injection technique: LOR air  Needle:  Needle type: Tuohy  Needle gauge: 17 G Needle length: 9 cm Needle insertion depth: 5 cm Catheter type: closed end flexible Catheter size: 19 Gauge Catheter at skin depth: 10 cm Test dose: negative and Other (1% lidocaine)  Assessment Events: blood not aspirated, injection not painful, no injection resistance, no paresthesia and negative IV test  Additional Notes Patient identified. Risks, benefits, and alternatives discussed with patient including but not limited to bleeding, infection, nerve damage, paralysis, failed block, incomplete pain control, headache, blood pressure changes, nausea, vomiting, reactions to medication, itching, and postpartum back pain. Confirmed with bedside nurse the patient's most recent platelet count. Confirmed with patient that they are not currently taking any anticoagulation, have any bleeding history, or any family history of bleeding disorders. Patient expressed understanding and wished to proceed. All questions were answered. Sterile technique was used throughout the entire procedure. Please see nursing notes for vital signs.   Crisp LOR on first pass. Test dose was given through epidural catheter and negative prior to continuing to dose epidural or start infusion. Warning signs of high block given to the patient including shortness of breath, tingling/numbness in hands,  complete motor block, or any concerning symptoms with instructions to call for help. Patient was given instructions on fall risk and not to get out of bed. All questions and concerns addressed with instructions to call with any issues or inadequate analgesia.  Reason for block:procedure for pain

## 2021-09-23 NOTE — Anesthesia Preprocedure Evaluation (Addendum)
Anesthesia Evaluation  Patient identified by MRN, date of birth, ID band Patient awake    Reviewed: Allergy & Precautions, Patient's Chart, lab work & pertinent test results  History of Anesthesia Complications Negative for: history of anesthetic complications  Airway Mallampati: II  TM Distance: >3 FB Neck ROM: Full    Dental no notable dental hx.    Pulmonary neg pulmonary ROS,    Pulmonary exam normal        Cardiovascular Normal cardiovascular exam  Hx of coarctation of aorta s/p repair in childhood   Neuro/Psych negative neurological ROS  negative psych ROS   GI/Hepatic negative GI ROS, Neg liver ROS,   Endo/Other  diabetes, Gestational  Renal/GU negative Renal ROS  negative genitourinary   Musculoskeletal negative musculoskeletal ROS (+)   Abdominal   Peds  Hematology negative hematology ROS (+)   Anesthesia Other Findings Day of surgery medications reviewed with patient.  Reproductive/Obstetrics (+) Pregnancy                            Anesthesia Physical Anesthesia Plan  ASA: 2  Anesthesia Plan: Epidural   Post-op Pain Management:    Induction:   PONV Risk Score and Plan: Treatment may vary due to age or medical condition  Airway Management Planned: Natural Airway  Additional Equipment: Fetal Monitoring  Intra-op Plan:   Post-operative Plan:   Informed Consent: I have reviewed the patients History and Physical, chart, labs and discussed the procedure including the risks, benefits and alternatives for the proposed anesthesia with the patient or authorized representative who has indicated his/her understanding and acceptance.       Plan Discussed with:   Anesthesia Plan Comments:         Anesthesia Quick Evaluation

## 2021-09-23 NOTE — Discharge Summary (Addendum)
Postpartum Discharge Summary  Date of Service updated     Patient Name: Kimberly Reid DOB: 1991-07-28 MRN: [id number]  Date of admission: 09/23/2021 Delivery date:09/23/2021  Delivering provider: WHITE, Ubaldo Glassing D  Date of discharge: 09/24/2021  Admitting diagnosis: Post term pregnancy over 40 weeks [O48.0] Intrauterine pregnancy: [redacted]w[redacted]d    Secondary diagnosis:  Principal Problem:   Post term pregnancy over 40 weeks Active Problems:   History of repair of coarctation of aorta   Language barrier   Vaginal delivery  Additional problems:    Discharge diagnosis: Term Pregnancy Delivered                                              Post partum procedures: None Augmentation: Cytotec Complications: None  Hospital course: Induction of Labor With Vaginal Delivery   30y.o. yo G3P2002 at 430w1das admitted to the hospital 09/23/2021 for induction of labor.  Indication for induction: Elective.  Patient had an uncomplicated labor course as follows: Membrane Rupture Time/Date: 12:52 PM ,09/23/2021   Delivery Method:Vaginal, Spontaneous  Episiotomy: None  Lacerations:  None  Details of delivery can be found in separate delivery note.  Patient had a routine postpartum course. Patient is discharged home 09/24/21.  Newborn Data: Birth date:09/23/2021  Birth time:2:54 PM  Gender:Female  Living status:Living  Apgars:9 ,9  Weight:3030 g   Magnesium Sulfate received: No BMZ received: No Rhophylac:N/A MMR:N/A T-DaP:Given prenatally Flu: Yes Transfusion:No  Physical exam  Vitals:   09/23/21 1806 09/23/21 2134 09/24/21 0129 09/24/21 0513  BP: 121/81 117/62 108/66 116/71  Pulse: 62 65 60 64  Resp: 18 20 20 18   Temp: 98.7 F (37.1 C) 97.8 F (36.6 C) 97.9 F (36.6 C) 98.2 F (36.8 C)  TempSrc: Oral Oral Oral Axillary  SpO2:  99% 99%   Weight:      Height:       General: alert, cooperative, and no distress Lochia: appropriate Uterine Fundus: firm Incision:  N/A DVT Evaluation: No evidence of DVT seen on physical exam. Labs: Lab Results  Component Value Date   WBC 8.4 09/23/2021   HGB 12.9 09/23/2021   HCT 37.6 09/23/2021   MCV 87.4 09/23/2021   PLT 191 09/23/2021   CMP Latest Ref Rng & Units 12/01/2019  Glucose 70 - 99 mg/dL 91  BUN 6 - 20 mg/dL 15  Creatinine 0.44 - 1.00 mg/dL 0.86  Sodium 135 - 145 mmol/L 141  Potassium 3.5 - 5.1 mmol/L 4.3  Chloride 98 - 111 mmol/L 104  CO2 22 - 32 mmol/L 26  Calcium 8.9 - 10.3 mg/dL 10.2  Total Protein 6.5 - 8.1 g/dL 7.4  Total Bilirubin 0.3 - 1.2 mg/dL 0.4  Alkaline Phos 38 - 126 U/L 73  AST 15 - 41 U/L 20  ALT 0 - 44 U/L 26   Edinburgh Score: Edinburgh Postnatal Depression Scale Screening Tool 09/23/2021  I have been able to laugh and see the funny side of things. 0  I have looked forward with enjoyment to things. 0  I have blamed myself unnecessarily when things went wrong. 0  I have been anxious or worried for no good reason. 0  I have felt scared or panicky for no good reason. 0  Things have been getting on top of me. 1  I have been so unhappy that I have had  difficulty sleeping. 0  I have felt sad or miserable. 0  I have been so unhappy that I have been crying. 0  The thought of harming myself has occurred to me. 0  Edinburgh Postnatal Depression Scale Total 1     After visit meds:  Allergies as of 09/24/2021   No Known Allergies      Medication List     STOP taking these medications    acetaminophen 325 MG tablet Commonly known as: Tylenol   multivitamin-prenatal 27-0.8 MG Tabs tablet       TAKE these medications    norethindrone 0.35 MG tablet Commonly known as: Ortho Micronor Take 1 tablet (0.35 mg total) by mouth daily.         Discharge home in stable condition Infant Feeding: Bottle and breast Infant Disposition:home with mother Discharge instruction: per After Visit Summary and Postpartum booklet. Activity: Advance as tolerated. Pelvic rest for 6  weeks.  Diet: routine diet Future Appointments: Future Appointments  Date Time Provider [place]  10/31/2021  2:15 PM Truett Mainland, DO [facility]   Follow up Visit:   Please schedule this patient for a In person postpartum visit in 6 weeks with the following provider: Any provider. Additional Postpartum F/U: None  Inbasket message sent to Dr. Harriet Masson to see if pt needs to see cardiology F/U for Hx Coarctation of the aorta.  Low risk pregnancy complicated by:  nothing Delivery mode:  Vaginal, Spontaneous  Anticipated Birth Control:  POPs   09/24/2021 Starr Lake, CNM

## 2021-09-23 NOTE — H&P (Addendum)
OBSTETRIC ADMISSION HISTORY AND PHYSICAL  Kimberly Reid is a 30 y.o. female [phone] with IUP at [redacted]w[redacted]d by early [place] presenting for elective term IOL. She reports +FMs, No LOF, no VB, no blurry vision, headaches or peripheral edema, and RUQ pain.  She plans on breast and bottle feeding. She request POP's for birth control.  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   COVID-19 virus infection 05/20/2019   Gestational diabetes    with first preg   Heart murmur    PCOS (polycystic ovarian syndrome)    Polycystic ovary syndrome 03/24/2013    Past Surgical History: Past Surgical History:  Procedure Laterality Date   CARDIAC SURGERY     COARCTATION OF AORTA REPAIR      Obstetrical History: OB History     Gravida  3   Para  2   Term  2   Preterm  0   AB  0   Living  2      SAB  0   IAB  0   Ectopic  0   Multiple  0   Live Births  2           Social History Social History   Socioeconomic History   Marital status: Married    Spouse name: Not on file   Number of children: 1   Years of education: Not on file   Highest education level: Not on file  Occupational History   Not on file  Tobacco Use   Smoking status: Never   Smokeless tobacco: Never  Vaping Use   Vaping Use: Never used  Substance and Sexual Activity   Alcohol use: Not Currently   Drug use: No   Sexual activity: Yes    Birth control/protection: None  Other Topics Concern   Not on file  Social History Narrative    Not on file   Social Determinants of Health   Financial Resource Strain: Not on file  Food Insecurity: No Food Insecurity   Worried About Running Out of Food in the Last Year: Never true   Ran Out of Food in the Last Year: Never true  Transportation Needs: No Transportation Needs   Lack of Transportation (Medical): No   Lack of Transportation (Non-Medical): No  Physical Activity: Not on file  Stress: Not on file  Social Connections: Not on file    Family History: Family History  Problem Relation Age of Onset   Diabetes Mother    Healthy Father    Congenital Murmur Sister     Allergies: No Known Allergies  Medications Prior to Admission  Medication Sig Dispense Refill Last Dose   acetaminophen (TYLENOL) 325 MG tablet Take 2 tablets (650 mg total) by mouth every 4 (four) hours as needed (for pain scale < 4). (Patient not taking: Reported on 09/07/2021) 30 tablet 0    Prenatal Vit-Fe Fumarate-FA (MULTIVITAMIN-PRENATAL) 27-0.8 MG TABS tablet Take 1 tablet by mouth daily at 12 noon.  Review of Systems   All systems reviewed and negative except as stated in HPI  Last menstrual period 10/30/2020, currently breastfeeding. General appearance: alert, cooperative, and no distress Lungs: no increased work of breathing Heart: warm and well perfused Abdomen: soft, gravid Extremities: no edema or calf tenderness Presentation:  vertex Fetal monitoring: Baseline 135 bpm, moderate variability, accels present, no decels, variables noted Uterine activity: present, irregular    Prenatal labs: ABO, Rh: O/Positive/-- (04/22 0936) Antibody: Negative (04/22 0936) Rubella: 8.36 (04/22 0936) RPR: Non Reactive (09/08 1007)  HBsAg: Negative (04/22 0936)  HIV: Non Reactive (09/08 1007)  GBS: Negative/-- (11/04 0916)  2 hr Glucola Normal Genetic screening: Normal NIPS Anatomy US: Normal  Prenatal Transfer Tool  Maternal Diabetes: No Genetic Screening: Normal Maternal  Ultrasounds/Referrals: Normal Fetal Ultrasounds or other Referrals:  Fetal echo, normal Maternal Substance Abuse:  No Significant Maternal Medications:  None Significant Maternal Lab Results: Group B Strep negative  No results found for this or any previous visit (from the past 24 hour(s)).  Patient Active Problem List   Diagnosis Date Noted   Post term pregnancy over 40 weeks 09/23/2021   Obesity in pregnancy 07/05/2021   BMI 35.0-35.9,adult 07/05/2021   Heart murmur 06/14/2021   Language barrier 03/29/2019   History of gestational diabetes in prior pregnancy, currently pregnant 02/15/2019   Supervision of high risk pregnancy, antepartum 11/19/2018   PCOS (polycystic ovarian syndrome) 05/26/2013   Insulin resistance 05/26/2013   History of repair of coarctation of aorta 05/26/2013    Assessment/Plan:  Kimberly Reid is a 30 y.o. G3P2002 at [redacted]w[redacted]d here for elective term IOL.  #Labor: irregular contractions; induction method will be cytotec to start with FB and pitocin as indicated. Continue with expectant management.  #Pain: Will offer epidural #FWB: Cat 1 #MOF: breast and bottle #MOC: POPs  Angela Cox, MD  09/23/2021, 12:10 AM  CNM attestation:  I have seen and examined this patient; I agree with above documentation in the resident's note.   Kimberly Reid is a 30 y.o. [phone] here for elective IOL  PE: BP 107/63   Pulse (!) 57   Temp 98.4 F (36.9 C) (Oral)   Resp 17   Ht 5\' 2"  (1.575 m)   Wt 97.3 kg   LMP 10/30/2020 (Exact Date)   BMI 39.21 kg/m  Gen: calm comfortable, NAD Resp: normal effort, no distress Abd: gravid  ROS, labs, PMH reviewed  Plan: Admit to Labor and Delivery Plan to start induction with cytotec and then hopefully move to Pit, but can use cervical foley prn Anticipate vag del  12/28/2020 CNM 09/23/2021, 4:50 AM

## 2021-09-23 NOTE — Lactation Note (Signed)
This note was copied from Kimberly baby's chart. Lactation Consultation Note  Patient Name: Kimberly Reid [id number] Date: 09/23/2021 Reason for consult: Initial assessment;Term Age:31 hours Consult was done in Spanish:  Initial visit to 4 hours old infant of Kimberly P3 mother. Mother states baby latched well during last feeding (1-h prior LC visit).  Mother requests Kimberly hand pump. LC reviewed how to use and fitted 27-mm flange.  Discussed normal newborn behavior and patterns, signs of good milk transfer, hunger cues, tummy size and benefits of skin to skin.   Plan: 1-Deep, comfortable latch and breastfeeding on demand or 8-12 times in 24h period. 2-Encouraged maternal rest, hydration and food intake.  3-Contact LC as needed for feeds/support/concerns/questions   All questions answered at this time. Provided Lactation services brochure and promoted [employer]Joy booklet information.     Maternal Data Has patient been taught Hand Expression?: Yes Does the patient have breastfeeding experience prior to this delivery?: Yes How long did the patient breastfeed?: 9 months x2 pumping and bottlefeeding breast milk  Feeding Mother's Current Feeding Choice: Breast Milk and Formula  LATCH Score Latch: Repeated attempts needed to sustain latch, nipple held in mouth throughout feeding, stimulation needed to elicit sucking reflex.  Audible Swallowing: None  Type of Nipple: Everted at rest and after stimulation  Comfort (Breast/Nipple): Soft / non-tender  Hold (Positioning): Assistance needed to correctly position infant at breast and maintain latch.  LATCH Score: 6   Lactation Tools Discussed/Used Tools: Pump;Flanges Flange Size: 27 Breast pump type: Manual Pump Education: Milk Storage;Setup, frequency, and cleaning Reason for Pumping: mother's preference Pumping frequency: as needed Pumped volume:  (drops with demonstration)  Interventions Interventions: Breast feeding basics  reviewed;Education;Skin to skin;Breast massage;Hand express;Expressed milk;LC Services brochure;Hand pump  Discharge Pump: Manual WIC Program: Yes  Consult Status Consult Status: Follow-up Date: 09/24/21 Follow-up type: In-patient    Kimberly Reid Kimberly Reid Kimberly Reid 09/23/2021, 6:52 PM

## 2021-09-23 NOTE — Progress Notes (Signed)
S: Resting in bed mostly comfortable but mildly feeling contractions without pressure. Dahlia Client, RN called to come bedside for variable decelerations. Significant other at bedside and supportive.   O: Vitals:   09/23/21 1252 09/23/21 1256 09/23/21 1301 09/23/21 1306  BP: 128/76 122/70 127/73 121/73  Pulse: (!) 55 (!) 51 (!) 53 (!) 51  Resp: 16 17    Temp: 98.1 F (36.7 C) 98.2 F (36.8 C)    TempSrc: Oral Oral    SpO2: 99%     Weight:      Height:        FHT:  FHR: 135 bpm, variability: moderate,  accelerations:  Present,  decelerations:  Present VARIABLES UC:   regular, every 2-4 minutes SVE:   Dilation: 5.5 Effacement (%): 70 Station: -2 Exam by:: h stone rnc  A / P: Elective IOL, progressing well on pitocin.  IUPC placed with ease and amnioinfusion initiated for variable declarations. Will monitor for improvement.   Fetal Wellbeing:  Category II Pain Control:  Epidural Anticipated MOD:  NSVD  Trellis Moment, SNM 09/23/2021, 1:36 PM

## 2021-09-23 NOTE — Lactation Note (Addendum)
This note was copied from a baby's chart. Lactation Consultation Note  Patient Name: Kimberly Reid [id number] Date: 09/23/2021 Reason for consult: L&D Initial assessment;Term Age:31 hours  L&D consult with <60 minutes old infant and P3 mother. Congratulated family on newborn.  Assisted with latch, laid back position. Baby pops & off breast, clicking sound noted. Several attempts to latch unsuccessful.  Assisted with hand expression and spoonfed ~53mL. Placed newborn back to skin to skin. Mother requests a hand pump. Mother states her other two babies did not latch and she bottlefed expressed breast milk for 20-months.  Discussed STS as ideal transition for infants after birth. Talked about primal reflexes. Explained LC services availability during postpartum stay. Thanked family for their time.      Maternal Data Has patient been taught Hand Expression?: Yes Does the patient have breastfeeding experience prior to this delivery?: Yes How long did the patient breastfeed?: 9 months x2 pumping and bottlefeeding breast milk  Feeding Mother's Current Feeding Choice: Breast Milk and Formula  LATCH Score Latch: Repeated attempts needed to sustain latch, nipple held in mouth throughout feeding, stimulation needed to elicit sucking reflex.  Audible Swallowing: None  Type of Nipple: Everted at rest and after stimulation  Comfort (Breast/Nipple): Soft / non-tender  Hold (Positioning): Assistance needed to correctly position infant at breast and maintain latch.  LATCH Score: 6  Interventions Interventions: Assisted with latch;Skin to skin;Breast massage;Hand express;Expressed milk;Education  Discharge WIC Program: Yes  Consult Status Consult Status: Follow-up from L&D Date: 09/23/21 Follow-up type: In-patient    Joli Koob A Higuera Ancidey 09/23/2021, 3:52 PM

## 2021-09-24 ENCOUNTER — Other Ambulatory Visit: Payer: Self-pay

## 2021-09-24 MED ORDER — NORETHINDRONE 0.35 MG PO TABS
1.0000 | ORAL_TABLET | Freq: Every day | ORAL | 11 refills | Status: AC
  Filled 2021-09-24: qty 28, 28d supply, fill #0

## 2021-09-24 NOTE — Lactation Note (Signed)
This note was copied from a baby's chart. Lactation Consultation Note  Patient Name: Kimberly Reid [id number] Date: 09/24/2021 Reason for consult: Follow-up assessment;Term Age:31 hours   P3 mother whose infant is now 1 hours old.  This is a term baby at 40+1 weeks.  Mother pumped and bottle fed for 9 months with her two other children.  Mother declined Spanish interpreter.  Mother reported difficulty latching to the left breast; stated her nipple "goes down" when the baby latches.  She said this was the same thing that happened when she fed her other two children on the left side.  Upon observation, mother's nipple on the left side is larger, however, there is no nipple trauma.  Both nipples are everted and intact.  Offered to assist with latching.  Baby "Kimberly Reid"  fed approximately one hour ago for 15 minutes but was showing cues.  Assisted to latch in the football hold x 3.  "Kimberly Reid" pushed back and was not interested in sustaining a latch for more than a few sucks.  Mother denied pain with these sucks.  Attempted in the cross cradle hold with the same results.  Swaddled baby "Kimberly Reid" and she settled down.  Explained to mother that I believe she was not ready to feed at this time.  I will be ready to assist again if desired.  Mother appreciative.  Mother has contacted the [facility] department to obtain her DEBP.  Father present.  RN updated.   Maternal Data Has patient been taught Hand Expression?: Yes Does the patient have breastfeeding experience prior to this delivery?: No (Mother pumped and bottle fed her EBM; pumped for 9 months)  Feeding Mother's Current Feeding Choice: Breast Milk  LATCH Score Latch: Repeated attempts needed to sustain latch, nipple held in mouth throughout feeding, stimulation needed to elicit sucking reflex.  Audible Swallowing: None  Type of Nipple: Everted at rest and after stimulation  Comfort (Breast/Nipple): Soft / non-tender  Hold  (Positioning): Assistance needed to correctly position infant at breast and maintain latch.  LATCH Score: 6   Lactation Tools Discussed/Used    Interventions Interventions: Breast feeding basics reviewed;Assisted with latch;Skin to skin;Breast massage;Hand express;Breast compression;Position options;Support pillows;Adjust position;Education  Discharge Pump: Personal (WIC pump) WIC Program: Yes  Consult Status Consult Status: Follow-up Date: 09/25/21 Follow-up type: In-patient    Marton Malizia R Galia Rahm 09/24/2021, 11:28 AM

## 2021-09-24 NOTE — Anesthesia Postprocedure Evaluation (Signed)
Anesthesia Post Note  Patient: Kimberly Reid  Procedure(s) Performed: AN AD HOC LABOR EPIDURAL     Patient location during evaluation: Mother Baby Anesthesia Type: Epidural Level of consciousness: awake Pain management: satisfactory to patient Vital Signs Assessment: post-procedure vital signs reviewed and stable Respiratory status: spontaneous breathing Cardiovascular status: stable Anesthetic complications: no   No notable events documented.  Last Vitals:  Vitals:   09/24/21 0129 09/24/21 0513  BP: 108/66 116/71  Pulse: 60 64  Resp: 20 18  Temp: 36.6 C 36.8 C  SpO2: 99%     Last Pain:  Vitals:   09/24/21 0528  TempSrc:   PainSc: 0-No pain   Pain Goal:                   [employer]

## 2021-09-25 ENCOUNTER — Other Ambulatory Visit: Payer: Self-pay

## 2021-09-25 ENCOUNTER — Telehealth: Payer: Self-pay

## 2021-09-25 NOTE — Telephone Encounter (Signed)
Called pt. Phone continued to ring, then received a message "the call can not be completed at this time."

## 2021-09-26 ENCOUNTER — Encounter: Payer: Self-pay | Admitting: Family Medicine

## 2021-10-06 ENCOUNTER — Telehealth (HOSPITAL_COMMUNITY): Payer: Self-pay

## 2021-10-06 NOTE — Telephone Encounter (Signed)
"  I'm fine. I'm doing well. My bleeding is very little now. I've been having a headache everyday. I want to know if that is normal? I take advil and it helps some but sometimes takes awhile to go away." RN reviewed what pre-eclampsia is and symptoms. "I'm only having the headaches everyday. I haven't checked my BP. " RN told patient to reach out to her OB-GYN about concern about headaches. RN also told patient that she can come to [facility] and we are happy to see her and help. Patient has no other questions or concerns about her healing.  "She's fine, sleeping now. She had lost some weight when we left the hospital but at her pediatrician appointment yesterday she had gained weight. She goes to the bathroom well, her poop is a yellow color now. The pediatrician said she is fine. She sleeps in a crib."RN reviewed ABC's of safe sleep with patient. Patient declines any questions or concerns about baby.  EPDS score is 0.  Marcelino Duster [facility] 10/06/2021,1128

## 2021-10-12 ENCOUNTER — Ambulatory Visit: Payer: Self-pay | Admitting: Cardiology

## 2021-10-31 ENCOUNTER — Ambulatory Visit: Payer: Self-pay | Admitting: Obstetrics and Gynecology

## 2021-11-15 ENCOUNTER — Encounter: Payer: Self-pay | Admitting: Family Medicine

## 2021-11-15 ENCOUNTER — Ambulatory Visit: Payer: Self-pay | Admitting: Family Medicine

## 2021-11-15 ENCOUNTER — Ambulatory Visit (INDEPENDENT_AMBULATORY_CARE_PROVIDER_SITE_OTHER): Payer: Self-pay | Admitting: Family Medicine

## 2021-11-15 ENCOUNTER — Other Ambulatory Visit: Payer: Self-pay

## 2021-11-15 LAB — POCT PREGNANCY, URINE: Preg Test, Ur: NEGATIVE

## 2021-11-15 NOTE — Progress Notes (Signed)
[place] Partum Visit Note  Kimberly Reid is a 31 y.o. G27P3003 female who presents for a postpartum visit. She is several weeks postpartum following a normal spontaneous vaginal delivery.  I have fully reviewed the prenatal and intrapartum course. The delivery was at [redacted]w[redacted]d gestational weeks.  Anesthesia: epidural. Postpartum course has been normal. Baby is doing well. Baby is feeding by breast. Bleeding patient is spotting but believes it is her cycle. Bowel function is normal. Bladder function is normal. Patient is sexually active. Contraception method is OCP (estrogen/progesterone). Postpartum depression screening: negative.   The pregnancy intention screening data noted above was reviewed. Potential methods of contraception were discussed. The patient elected to proceed with No data recorded.   Edinburgh Postnatal Depression Scale - 11/15/21 1452       Edinburgh Postnatal Depression Scale:  In the Past 7 Days   I have been able to laugh and see the funny side of things. 0    I have looked forward with enjoyment to things. 0    I have blamed myself unnecessarily when things went wrong. 0    I have been anxious or worried for no good reason. 0    I have felt scared or panicky for no good reason. 0    Things have been getting on top of me. 0    I have been so unhappy that I have had difficulty sleeping. 0    I have felt sad or miserable. 0    I have been so unhappy that I have been crying. 0    The thought of harming myself has occurred to me. 0    Edinburgh Postnatal Depression Scale Total 0             Health Maintenance Due  Topic Date Due   COVID-19 Vaccine (1) Never done   FOOT EXAM  Never done   OPHTHALMOLOGY EXAM  Never done   URINE MICROALBUMIN  01/27/2019   HEMOGLOBIN A1C  07/21/2019   PAP SMEAR-Modifier  11/19/2021    The following portions of the patient's history were reviewed and updated as appropriate: allergies, current medications, past family history,  past medical history, past social history, past surgical history, and problem list.  Review of Systems Pertinent items noted in HPI and remainder of comprehensive ROS otherwise negative.  Objective:  BP 119/64    Pulse 69    Wt 198 lb 3.2 oz (89.9 kg)    LMP 10/30/2020 (Exact Date)    Breastfeeding Yes    BMI 36.25 kg/m    General:  alert, cooperative, and appears stated age  Lungs: Normal effort  Heart:  regular rate and rhythm  Abdomen: soft, non-tender; bowel sounds normal; no masses,  no organomegaly        Assessment:   Normal  postpartum exam.   Plan:   Essential components of care per ACOG recommendations:  1.  Mood and well being: Patient with negative depression screening today. Reviewed local resources for support.  - Patient tobacco use? No.   - hx of drug use? No.    2. Infant care and feeding:  -Patient currently breastmilk feeding? Yes. Reviewed importance of draining breast regularly to support lactation.    3. Sexuality, contraception and birth spacing - Patient does not want a pregnancy in the next year.  Desired family size is 3 children.  - Reviewed forms of contraception in tiered fashion. Patient desired oral progesterone-only contraceptive today.   - Discussed birth  spacing of 18 months  4. Sleep and fatigue -Encouraged family/partner/community support of 4 hrs of uninterrupted sleep to help with mood and fatigue  5. Physical Recovery  - Discussed patients delivery and complications. She describes her labor as good. - Patient had a Vaginal, no problems at delivery. Patient had no laceration. Perineal healing reviewed. Patient expressed understanding - Patient has urinary incontinence? No. - Patient is safe to resume physical and sexual activity  6.  Health Maintenance - HM due items addressed Yes referral pap smear - Last pap smear  Diagnosis  Date Value Ref Range Status  11/19/2018   Final   NEGATIVE FOR INTRAEPITHELIAL LESIONS OR MALIGNANCY.    Pap smear not done at today's visit.  -Breast Cancer screening indicated? No.   7. Chronic Disease/Pregnancy Condition follow up:  Coarctation of aorta s/p repair, has Cardio/OB Follow-up  - PCP follow up  Donnamae Jude, [place] [facility] [place], [address]

## 2021-12-26 ENCOUNTER — Other Ambulatory Visit: Payer: Self-pay

## 2021-12-26 ENCOUNTER — Other Ambulatory Visit: Payer: Self-pay | Admitting: *Deleted

## 2021-12-26 DIAGNOSIS — Z124 Encounter for screening for malignant neoplasm of cervix: Secondary | ICD-10-CM

## 2021-12-26 NOTE — Progress Notes (Signed)
Patient: Kimberly Reid           ?Date of Birth: Aug 28, 1991           ?MRN: [id number] ?Visit Date: 12/26/2021 ?PCP: Claiborne Rigg, NP ? ?Cervical Cancer Screening ?Do you smoke?: No ?Have you ever had or been told you have an allergy to latex products?: No ?Marital status: Married ?Date of last pap smear: 2-5 yrs ago (11/19/2018) ?Date of last menstrual period:  (breast-feeding) ?Number of pregnancies: 3 ?Number of births: 3 ?Have you ever had any of the following? ?Hysterectomy: No ?Tubal ligation (tubes tied): No ?Abnormal bleeding: No ?Abnormal pap smear: No ?Venereal warts: No ?A sex partner with venereal warts: No ?A high risk* sex partner: No ? ?Cervical Exam ? ?Abnormal Observations: Patient has sores observed below labia that per patient go away on their own that she has seen her provider about in the past. Patient denied pain.  ?Recommendations: Last Pap smear 11/19/2018 at [facility] and normal. Per patient has no history of an abnormal Pap smear. Last Pap smear result is available in Epic. Let patient know if today's Pap smear is normal and HPV negative that her next Pap smear will be due in 5 years. Informed patient that will follow up with her within the next couple of weeks with results of her Pap smear by phone. Patient verbalized understanding. ? ? ?Spanish interpreter Natale Lay from [facility] provided. ? ?Patient's History ?Patient Active Problem List  ? Diagnosis Date Noted  ? Obesity in pregnancy 07/05/2021  ? BMI 35.0-35.9,adult 07/05/2021  ? Heart murmur 06/14/2021  ? Language barrier 03/29/2019  ? History of gestational diabetes 02/15/2019  ? PCOS (polycystic ovarian syndrome) 05/26/2013  ? Insulin resistance 05/26/2013  ? History of repair of coarctation of aorta 05/26/2013  ? ?Past Medical History:  ?Diagnosis Date  ? Congenital heart anomaly   ? had surgery as a child  ? COVID-19 virus infection 05/20/2019  ? Gestational diabetes   ? with first preg  ? Heart  murmur   ? PCOS (polycystic ovarian syndrome)   ? Polycystic ovary syndrome 03/24/2013  ?  ?Family History  ?Problem Relation Age of Onset  ? Diabetes Mother   ? Healthy Father   ? Congenital Murmur Sister   ?  ?Social History  ? ?Occupational History  ? Not on file  ?Tobacco Use  ? Smoking status: Never  ? Smokeless tobacco: Never  ?Vaping Use  ? Vaping Use: Never used  ?Substance and Sexual Activity  ? Alcohol use: Not Currently  ? Drug use: No  ? Sexual activity: Yes  ?  Birth control/protection: None  ? ?

## 2021-12-27 LAB — CYTOLOGY - PAP
Comment: NEGATIVE
Diagnosis: NEGATIVE
High risk HPV: NEGATIVE

## 2021-12-31 ENCOUNTER — Telehealth: Payer: Self-pay

## 2021-12-31 NOTE — Telephone Encounter (Signed)
Called patient via [employer] #[phone] to give pap smear results. Informed patient that pap smear was normal and HPV was negative. Based on this result her next pap smear will be due in 5 years. Patient voiced understanding.  ?

## 2023-02-10 ENCOUNTER — Encounter: Payer: Self-pay | Admitting: *Deleted

## 2023-10-24 ENCOUNTER — Ambulatory Visit (HOSPITAL_COMMUNITY)
Admission: EM | Admit: 2023-10-24 | Discharge: 2023-10-24 | Disposition: A | Discharge status: Home / Self Care | Payer: Self-pay | Attending: Family Medicine | Admitting: Family Medicine

## 2023-10-24 ENCOUNTER — Encounter (HOSPITAL_COMMUNITY): Payer: Self-pay

## 2023-10-24 DIAGNOSIS — Z8632 Personal history of gestational diabetes: Secondary | ICD-10-CM

## 2023-10-24 DIAGNOSIS — H60313 Diffuse otitis externa, bilateral: Secondary | ICD-10-CM

## 2023-10-24 LAB — POCT FASTING CBG KUC MANUAL ENTRY: POCT Glucose (KUC): 88 mg/dL (ref 70–99)

## 2023-10-24 MED ORDER — METHYLPREDNISOLONE ACETATE 80 MG/ML IJ SUSP
80.0000 mg | Freq: Once | INTRAMUSCULAR | Status: AC
  Administered 2023-10-24: 80 mg via INTRAMUSCULAR

## 2023-10-24 MED ORDER — METHYLPREDNISOLONE ACETATE 80 MG/ML IJ SUSP
INTRAMUSCULAR | Status: AC
  Filled 2023-10-24: qty 1

## 2023-10-24 MED ORDER — DEXAMETHASONE SODIUM PHOSPHATE 10 MG/ML IJ SOLN
INTRAMUSCULAR | Status: AC
  Filled 2023-10-24: qty 1

## 2023-10-24 MED ORDER — AMOXICILLIN-POT CLAVULANATE 875-125 MG PO TABS: 1.0000 | ORAL_TABLET | Freq: Two times a day (BID) | ORAL | 0 refills | Status: AC

## 2023-10-24 MED ORDER — METHYLPREDNISOLONE SODIUM SUCC 125 MG IJ SOLR
INTRAMUSCULAR | Status: AC
  Filled 2023-10-24: qty 2

## 2023-10-24 MED ORDER — OFLOXACIN 0.3 % OT SOLN: 5.0000 [drp] | Freq: Two times a day (BID) | OTIC | 0 refills | Status: AC

## 2023-10-24 NOTE — ED Provider Notes (Signed)
[facility]    CSN: [id number] Arrival date & time: 10/24/23  1104      History   Chief Complaint Chief Complaint  Patient presents with   Otalgia    HPI Lanese Graeber is a 32 y.o. female.    Otalgia Here for bilateral ear pain that began about 4 months ago  She does not have any fever or congestion.  The ears do itch bilaterally.  They are feeling a little worse in the last few days and so she came to be seen.  She denies a history of diabetes, but it has been 2 years or more since she had blood work.  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   COVID-19 virus infection 05/20/2019   Gestational diabetes    with first preg   Heart murmur    PCOS (polycystic ovarian syndrome)    Polycystic ovary syndrome 03/24/2013    Patient Active Problem List   Diagnosis Date Noted   Obesity in pregnancy 07/05/2021   BMI 35.0-35.9,adult 07/05/2021   Heart murmur 06/14/2021   Language barrier 03/29/2019   History of gestational diabetes 02/15/2019   PCOS (polycystic ovarian syndrome) 05/26/2013   Insulin resistance 05/26/2013   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 Authorizing Provider  norethindrone (ORTHO MICRONOR) 0.35 MG tablet Take 1 tablet (0.35 mg total) by mouth daily. 09/24/21 09/24/22  Marylene Land, CNM    Family History Family History  Problem Relation Age of Onset   Diabetes Mother    Healthy Father    Congenital Murmur Sister      Social History Social History   Tobacco Use   Smoking status: Never   Smokeless tobacco: Never  Vaping Use   Vaping status: Never Used  Substance Use Topics   Alcohol use: Not Currently   Drug use: No     Allergies   Patient has no known allergies.   Review of Systems Review of Systems  HENT:  Positive for ear pain.      Physical Exam Triage Vital Signs ED Triage Vitals  Encounter Vitals Group     BP 10/24/23 1157 109/75     Systolic BP Percentile --      Diastolic BP Percentile --      Pulse Rate 10/24/23 1157 61     Resp 10/24/23 1157 18     Temp 10/24/23 1157 97.9 F (36.6 C)     Temp Source 10/24/23 1157 Oral     SpO2 10/24/23 1157 98 %     Weight 10/24/23 1156 195 lb (88.5 kg)     Height 10/24/23 1156 5\' 2"  (1.575 m)  Head Circumference --      Peak Flow --      Pain Score 10/24/23 1155 6     Pain Loc --      Pain Education --      Exclude from Growth Chart --    No data found.  Updated Vital Signs BP 109/75 (BP Location: Right Arm)   Pulse 61   Temp 97.9 F (36.6 C) (Oral)   Resp 18   Ht 5\' 2"  (1.575 m)   Wt 88.5 kg   LMP 09/28/2023 (Exact Date)   SpO2 98%   Breastfeeding No   BMI 35.67 kg/m   Visual Acuity Right Eye Distance:   Left Eye Distance:   Bilateral Distance:    Right Eye Near:   Left Eye Near:    Bilateral Near:     Physical Exam Vitals reviewed.  Constitutional:      General: She is not in acute distress.    Appearance: She is not ill-appearing, toxic-appearing or diaphoretic.  HENT:     Ears:     Comments: Both external pinna are mildly swollen and erythematous and have rash.  There is no drainage noted.  The canals are swollen bilaterally and there is white discharge in both ear canals.  Tympanic membranes are gray and dull bilaterally    Nose: Nose normal.     Mouth/Throat:     Mouth: Mucous membranes are moist.  Eyes:     Extraocular Movements: Extraocular movements intact.     Conjunctiva/sclera:  Conjunctivae normal.     Pupils: Pupils are equal, round, and reactive to light.  Cardiovascular:     Rate and Rhythm: Normal rate and regular rhythm.  Pulmonary:     Effort: Pulmonary effort is normal.     Breath sounds: Normal breath sounds.  Musculoskeletal:     Cervical back: Neck supple.  Lymphadenopathy:     Cervical: No cervical adenopathy.  Skin:    Capillary Refill: Capillary refill takes less than 2 seconds.     Coloration: Skin is not pale.  Neurological:     General: No focal deficit present.     Mental Status: She is alert and oriented to person, place, and time.  Psychiatric:        Behavior: Behavior normal.      UC Treatments / Results  Labs (all labs ordered are listed, but only abnormal results are displayed) Labs Reviewed  POCT FASTING [facility] MANUAL ENTRY    EKG   Radiology No results found.  Procedures Procedures (including critical care time)  Medications Ordered in UC Medications - No data to display  Initial Impression / Assessment and Plan / UC Course  I have reviewed the triage vital signs and the nursing notes.  Pertinent labs & imaging results that were available during my care of the patient were reviewed by me and considered in my medical decision making (see chart for details).    Visit conducted in spanish  Glucose is 88.  Floxin drops and augmentin tabs are sent in for otitis externa, with the swelling of the pinna noted. Depomedrol is given since at least in part it may be dermatitis   She will apply hydrocortisone cream to the external ears  Final Clinical Impressions(s) / UC Diagnoses   Final diagnoses:  None   Discharge Instructions   None    ED Prescriptions   None    PDMP not reviewed this encounter.   Zenia Resides, MD  10/24/23 1304  

## 2023-10-24 NOTE — Discharge Instructions (Addendum)
Your sugar was 88, which is normal. (Su nivel de azcar era 88, lo cual es normal.)  -Floxin eardrops-5 drops in the affected ear 2 times daily for 7 days. (-Floxin gotas para los odos-5 gotas en el odo afectado 2 veces al da durante 7 [address]  [address]  Take amoxicillin-clavulanate 875 mg--1 tab twice daily with food for 7 days. [facility] amoxicilina-clavulanato 875 mg: 1 tableta dos veces al da con alimentos durante 7 das)  You have been given an injection of methylprednisolone 80 mg, steroid. (Le han administrado una inyeccin de metilprednisolona 80 mg, esteroide.)  Please also apply over-the-counter hydrocortisone cream and apply to your external ears twice daily until improved. (Aplique tambin una crema de hidrocortisona de venta libre y aplquela en los odos externos dos veces al da hasta que mejore.)  You can use the QR code/website at the back of the summary paperwork to schedule yourself a new patient appointment with primary care. (Puede utilizar el cdigo QR/sitio web que se Engineer, water parte posterior del resumen del documento para programar una cita para un nuevo paciente con atencin primaria.)

## 2023-10-24 NOTE — ED Triage Notes (Signed)
Pt presents with bilateral ear pain x "months." Pt states she has taken Ibuprofen and applied Vaseline to both ears with no improvement in pain/symptoms. Pt currently rates her ear pain a 6/10. Pt is also reporting itchiness in both ears.
